# Patient Record
Sex: Female | Born: 1975
Health system: Southern US, Community
[De-identification: ages and names within clinical notes are randomized; demographics above are authoritative.]

## PROBLEM LIST (undated history)

## (undated) DIAGNOSIS — J449 Chronic obstructive pulmonary disease, unspecified: Secondary | ICD-10-CM

## (undated) DIAGNOSIS — K219 Gastro-esophageal reflux disease without esophagitis: Secondary | ICD-10-CM

## (undated) DIAGNOSIS — M129 Arthropathy, unspecified: Secondary | ICD-10-CM

## (undated) DIAGNOSIS — F319 Bipolar disorder, unspecified: Secondary | ICD-10-CM

## (undated) DIAGNOSIS — E785 Hyperlipidemia, unspecified: Secondary | ICD-10-CM

## (undated) DIAGNOSIS — K7689 Other specified diseases of liver: Secondary | ICD-10-CM

## (undated) DIAGNOSIS — I38 Endocarditis, valve unspecified: Secondary | ICD-10-CM

## (undated) DIAGNOSIS — F32A Depression, unspecified: Secondary | ICD-10-CM

## (undated) DIAGNOSIS — G629 Polyneuropathy, unspecified: Secondary | ICD-10-CM

## (undated) DIAGNOSIS — E119 Type 2 diabetes mellitus without complications: Secondary | ICD-10-CM

## (undated) DIAGNOSIS — F1110 Opioid abuse, uncomplicated: Secondary | ICD-10-CM

## (undated) DIAGNOSIS — K759 Inflammatory liver disease, unspecified: Secondary | ICD-10-CM

## (undated) DIAGNOSIS — I10 Essential (primary) hypertension: Secondary | ICD-10-CM

## (undated) DIAGNOSIS — T7840XA Allergy, unspecified, initial encounter: Secondary | ICD-10-CM

## (undated) DIAGNOSIS — F329 Major depressive disorder, single episode, unspecified: Secondary | ICD-10-CM

## (undated) HISTORY — DX: Opioid abuse, uncomplicated: F11.10

## (undated) HISTORY — DX: Polyneuropathy, unspecified: G62.9

## (undated) HISTORY — DX: Inflammatory liver disease, unspecified: K75.9

## (undated) HISTORY — DX: Other specified diseases of liver: K76.89

## (undated) HISTORY — PX: TONSILLECTOMY: SUR1361

## (undated) HISTORY — DX: Bipolar disorder, unspecified: F31.9

## (undated) HISTORY — DX: Gastro-esophageal reflux disease without esophagitis: K21.9

## (undated) HISTORY — DX: Allergy, unspecified, initial encounter: T78.40XA

## (undated) HISTORY — DX: Depression, unspecified: F32.A

## (undated) HISTORY — DX: Type 2 diabetes mellitus without complications: E11.9

## (undated) HISTORY — DX: Hyperlipidemia, unspecified: E78.5

## (undated) HISTORY — PX: OTHER SURGICAL HISTORY: SHX169

## (undated) HISTORY — DX: Chronic obstructive pulmonary disease, unspecified: J44.9

## (undated) HISTORY — DX: Essential (primary) hypertension: I10

## (undated) HISTORY — DX: Arthropathy, unspecified: M12.9

## (undated) HISTORY — DX: Major depressive disorder, single episode, unspecified: F32.9

---

## 2012-06-13 ENCOUNTER — Ambulatory Visit: Payer: Self-pay | Admitting: Gastroenterology

## 2012-12-27 DIAGNOSIS — M5412 Radiculopathy, cervical region: Secondary | ICD-10-CM

## 2012-12-27 DIAGNOSIS — M545 Low back pain, unspecified: Secondary | ICD-10-CM

## 2012-12-27 DIAGNOSIS — M542 Cervicalgia: Secondary | ICD-10-CM

## 2012-12-27 DIAGNOSIS — G894 Chronic pain syndrome: Secondary | ICD-10-CM

## 2012-12-27 HISTORY — DX: Cervicalgia: M54.2

## 2012-12-27 HISTORY — DX: Low back pain, unspecified: M54.50

## 2012-12-27 HISTORY — DX: Chronic pain syndrome: G89.4

## 2012-12-27 HISTORY — DX: Radiculopathy, cervical region: M54.12

## 2013-11-08 DIAGNOSIS — E119 Type 2 diabetes mellitus without complications: Secondary | ICD-10-CM | POA: Diagnosis not present

## 2013-11-08 DIAGNOSIS — L270 Generalized skin eruption due to drugs and medicaments taken internally: Secondary | ICD-10-CM | POA: Diagnosis not present

## 2013-11-08 DIAGNOSIS — F17200 Nicotine dependence, unspecified, uncomplicated: Secondary | ICD-10-CM | POA: Diagnosis not present

## 2013-11-08 DIAGNOSIS — J449 Chronic obstructive pulmonary disease, unspecified: Secondary | ICD-10-CM | POA: Diagnosis not present

## 2013-11-08 DIAGNOSIS — Z888 Allergy status to other drugs, medicaments and biological substances status: Secondary | ICD-10-CM | POA: Diagnosis not present

## 2013-11-08 DIAGNOSIS — I10 Essential (primary) hypertension: Secondary | ICD-10-CM | POA: Diagnosis not present

## 2013-11-10 DIAGNOSIS — A3790 Whooping cough, unspecified species without pneumonia: Secondary | ICD-10-CM | POA: Diagnosis not present

## 2013-11-10 DIAGNOSIS — I10 Essential (primary) hypertension: Secondary | ICD-10-CM | POA: Diagnosis not present

## 2013-11-10 DIAGNOSIS — IMO0001 Reserved for inherently not codable concepts without codable children: Secondary | ICD-10-CM | POA: Diagnosis not present

## 2013-11-26 DIAGNOSIS — F319 Bipolar disorder, unspecified: Secondary | ICD-10-CM | POA: Diagnosis not present

## 2013-11-26 DIAGNOSIS — E7800 Pure hypercholesterolemia, unspecified: Secondary | ICD-10-CM | POA: Diagnosis not present

## 2013-11-26 DIAGNOSIS — E119 Type 2 diabetes mellitus without complications: Secondary | ICD-10-CM | POA: Diagnosis not present

## 2014-02-07 DIAGNOSIS — IMO0002 Reserved for concepts with insufficient information to code with codable children: Secondary | ICD-10-CM | POA: Diagnosis not present

## 2014-02-07 DIAGNOSIS — L0291 Cutaneous abscess, unspecified: Secondary | ICD-10-CM | POA: Diagnosis not present

## 2014-03-03 DIAGNOSIS — N912 Amenorrhea, unspecified: Secondary | ICD-10-CM | POA: Diagnosis not present

## 2014-03-03 DIAGNOSIS — I10 Essential (primary) hypertension: Secondary | ICD-10-CM | POA: Diagnosis not present

## 2014-03-03 DIAGNOSIS — E559 Vitamin D deficiency, unspecified: Secondary | ICD-10-CM | POA: Diagnosis not present

## 2014-03-03 DIAGNOSIS — M2550 Pain in unspecified joint: Secondary | ICD-10-CM | POA: Diagnosis not present

## 2014-03-03 DIAGNOSIS — IMO0002 Reserved for concepts with insufficient information to code with codable children: Secondary | ICD-10-CM | POA: Diagnosis not present

## 2014-03-03 DIAGNOSIS — E782 Mixed hyperlipidemia: Secondary | ICD-10-CM | POA: Diagnosis not present

## 2014-03-03 DIAGNOSIS — E1142 Type 2 diabetes mellitus with diabetic polyneuropathy: Secondary | ICD-10-CM | POA: Diagnosis not present

## 2014-03-03 DIAGNOSIS — E039 Hypothyroidism, unspecified: Secondary | ICD-10-CM | POA: Diagnosis not present

## 2014-03-03 DIAGNOSIS — F17200 Nicotine dependence, unspecified, uncomplicated: Secondary | ICD-10-CM | POA: Diagnosis not present

## 2014-03-03 DIAGNOSIS — E1149 Type 2 diabetes mellitus with other diabetic neurological complication: Secondary | ICD-10-CM | POA: Diagnosis not present

## 2014-03-03 DIAGNOSIS — E119 Type 2 diabetes mellitus without complications: Secondary | ICD-10-CM | POA: Diagnosis not present

## 2014-03-18 DIAGNOSIS — J449 Chronic obstructive pulmonary disease, unspecified: Secondary | ICD-10-CM | POA: Diagnosis not present

## 2014-03-18 DIAGNOSIS — F17200 Nicotine dependence, unspecified, uncomplicated: Secondary | ICD-10-CM | POA: Diagnosis not present

## 2014-03-18 DIAGNOSIS — R059 Cough, unspecified: Secondary | ICD-10-CM | POA: Diagnosis not present

## 2014-03-18 DIAGNOSIS — J310 Chronic rhinitis: Secondary | ICD-10-CM | POA: Diagnosis not present

## 2014-03-18 DIAGNOSIS — R05 Cough: Secondary | ICD-10-CM | POA: Diagnosis not present

## 2014-03-23 DIAGNOSIS — E109 Type 1 diabetes mellitus without complications: Secondary | ICD-10-CM | POA: Diagnosis not present

## 2014-03-30 DIAGNOSIS — K219 Gastro-esophageal reflux disease without esophagitis: Secondary | ICD-10-CM | POA: Diagnosis not present

## 2014-03-30 DIAGNOSIS — K5900 Constipation, unspecified: Secondary | ICD-10-CM | POA: Diagnosis not present

## 2014-03-30 DIAGNOSIS — R894 Abnormal immunological findings in specimens from other organs, systems and tissues: Secondary | ICD-10-CM | POA: Diagnosis not present

## 2014-03-30 DIAGNOSIS — R945 Abnormal results of liver function studies: Secondary | ICD-10-CM | POA: Diagnosis not present

## 2014-03-31 DIAGNOSIS — I10 Essential (primary) hypertension: Secondary | ICD-10-CM | POA: Diagnosis not present

## 2014-03-31 DIAGNOSIS — J449 Chronic obstructive pulmonary disease, unspecified: Secondary | ICD-10-CM | POA: Diagnosis not present

## 2014-03-31 DIAGNOSIS — E1149 Type 2 diabetes mellitus with other diabetic neurological complication: Secondary | ICD-10-CM | POA: Diagnosis not present

## 2014-03-31 DIAGNOSIS — F17200 Nicotine dependence, unspecified, uncomplicated: Secondary | ICD-10-CM | POA: Diagnosis not present

## 2014-03-31 DIAGNOSIS — M5137 Other intervertebral disc degeneration, lumbosacral region: Secondary | ICD-10-CM | POA: Diagnosis not present

## 2014-04-02 ENCOUNTER — Ambulatory Visit (INDEPENDENT_AMBULATORY_CARE_PROVIDER_SITE_OTHER): Payer: Medicare Other

## 2014-04-02 VITALS — BP 106/52 | HR 102 | Temp 98.7°F | Resp 20

## 2014-04-02 DIAGNOSIS — R52 Pain, unspecified: Secondary | ICD-10-CM

## 2014-04-02 DIAGNOSIS — L923 Foreign body granuloma of the skin and subcutaneous tissue: Secondary | ICD-10-CM | POA: Diagnosis not present

## 2014-04-02 DIAGNOSIS — L02612 Cutaneous abscess of left foot: Secondary | ICD-10-CM

## 2014-04-02 DIAGNOSIS — L03119 Cellulitis of unspecified part of limb: Secondary | ICD-10-CM

## 2014-04-02 DIAGNOSIS — L02619 Cutaneous abscess of unspecified foot: Secondary | ICD-10-CM

## 2014-04-02 MED ORDER — CIPROFLOXACIN HCL 500 MG PO TABS: 500.0000 mg | ORAL_TABLET | Freq: Two times a day (BID) | ORAL | Status: DC

## 2014-04-02 MED ORDER — CLINDAMYCIN HCL 150 MG PO CAPS: 150.0000 mg | ORAL_CAPSULE | Freq: Four times a day (QID) | ORAL | Status: DC

## 2014-04-02 NOTE — Progress Notes (Signed)
Subjective:    Patient ID: Emily Larson, female    DOB: [birth year], 38 y.o.   MRN: [id number]  HPI I AM A DIABETIC AND HAVE BEEN FOR ABOUT 5 YEARS AND BURNS AND THROBS ON BOTH MY FEET AND NUMBNESS AND TINGLING AND SOME SWELLING AND HAS SOME COLDNESS TO IT AND HAVE A PLACE ON THE BALL OF THE LEFT FOOT AND HAS BEEN GOING ON FOR ABOUT A MONTH AND THOUGHT IT WAS GLASS AND DR LAND SAID HE GOT IT ALL OUT    Review of Systems  Constitutional: Positive for fever, chills and fatigue.       SWEATING   Respiratory: Positive for shortness of breath and wheezing.   Gastrointestinal: Positive for nausea.  Endocrine:       EXCESSIVE THIRST  Musculoskeletal: Positive for back pain and gait problem.       JOINT PAIN   Neurological: Positive for weakness and numbness.  Hematological:       SLOW TO HEAL  All other systems reviewed and are negative.      Objective:   Physical Exam   38 year old white female well-developed well-nourished oriented x3 presents at this time with significant pain of her left foot patient presents this time with a history of diabetic neuropathy has been pain management currently taking Lyrica for her diabetic neuropathy. Patient however recently had a episode of possible foreign body of glass removed from the bottom of her left foot however continues to have pain tenderness discomfort for the past month. On exam there is an area of lesion plantar sub-fourth metatarsal area left foot possible history of puncture wound versus verruca however there is concern of edema and some fluctuance increased temperature in pain on palpation in the general vicinity possibly consistent with a foreign body and abscess.  Lower extremity objective findings as follows vascular status is intact with pedal pulses palpable DP +2/4 bilateral PT +2/4 bilateral capillary refill time 3 seconds all digits epicritic and proprioceptive sensations intact and symmetric bilateral patient severe hyperesthesia  pain in symptomology with diabetic neuropathy type symptoms. Neurologically skin color pigment normal hair growth absent a small lesion sub-fourth metatarsal which or debridement did not feel any purulent discharge or drainage however did not go down to subcutaneous tissue level Neosporin and Band-Aid is applied to this area at this time x-rays demonstrate a foreign body posse metallic or lead glass plantar to the base of the fourth digit proximal phalanx in the plantar fat pad sub-fourth metatarsal phalangeal joint area left foot. There is no other open wounds no secondary infection localized cellulitis is noted and significant pain however patient has been with pain management I recommended that if she needs anything for pain to make arrangements with her primary Dr. has been managing her pain that would not provide her with narcotic pain medication at this time recommended Advil as needed for pain. Cannot take Tylenol due to hepatitis C history.       Assessment & Plan:  Assessment foreign body abscess sub-fourth metatarsal left foot with localized cellulitis pain and inflammation. Patient will continue with her current pain regimen for primary physician is been managing her pain also scheduled to see pain management doctor in the future. At this time recommendation is for excision foreign body in foreign body abscess plantar left foot a local anesthetic block and regional and IV sedation local anesthetic block a be utilized likely for surgery done at the [facility] specialty surgical center consent form is reviewed and  signed all questions asked medication are answered surgery scheduled her convenience for incision drainage of abscess and excision foreign body Kim's fluoroscopy to identify the foreign body. Surgery was scheduled patient be followed in one week for postop followup try to avoid opiates for management consider a Profore and he'll in the interim suggested Neosporin and Band-Aid be maintained  can do a soaking Epson salts warm water if needed apply ice pack to help reduce the pain in symptomology as well  Prescriptions for clindamycin and ciprofloxacin are dispensed at this time.  Alvan Dameichard Wilbur Oakland DPM

## 2014-04-02 NOTE — Patient Instructions (Signed)
Pre-Operative Instructions  Congratulations, you have decided to take an important step to improving your quality of life.  You can be assured that the doctors of [facility] will be with you every step of the way.  1. Plan to be at the surgery center/hospital at least 1 (one) hour prior to your scheduled time unless otherwise directed by the surgical center/hospital staff.  You must have a responsible adult accompany you, remain during the surgery and drive you home.  Make sure you have directions to the surgical center/hospital and know how to get there on time. 2. For hospital based surgery you will need to obtain a history and physical form from your family physician within 1 month prior to the date of surgery- we will give you a form for you primary physician.  3. We make every effort to accommodate the date you request for surgery.  There are however, times where surgery dates or times have to be moved.  We will contact you as soon as possible if a change in schedule is required.   4. No Aspirin/Ibuprofen for one week before surgery.  If you are on aspirin, any non-steroidal anti-inflammatory medications (Mobic, Aleve, Ibuprofen) you should stop taking it 7 days prior to your surgery.  You make take Tylenol  For pain prior to surgery.  5. Medications- If you are taking daily heart and blood pressure medications, seizure, reflux, allergy, asthma, anxiety, pain or diabetes medications, make sure the surgery center/hospital is aware before the day of surgery so they may notify you which medications to take or avoid the day of surgery. 6. No food or drink after midnight the night before surgery unless directed otherwise by surgical center/hospital staff. 7. No alcoholic beverages 24 hours prior to surgery.  No smoking 24 hours prior to or 24 hours after surgery. 8. Wear loose pants or shorts- loose enough to fit over bandages, boots, and casts. 9. No slip on shoes, sneakers are best. 10. Bring  your boot with you to the surgery center/hospital.  Also bring crutches or a walker if your physician has prescribed it for you.  If you do not have this equipment, it will be provided for you after surgery. 11. If you have not been contracted by the surgery center/hospital by the day before your surgery, call to confirm the date and time of your surgery. 12. Leave-time from work may vary depending on the type of surgery you have.  Appropriate arrangements should be made prior to surgery with your employer. 13. Prescriptions will be provided immediately following surgery by your doctor.  Have these filled as soon as possible after surgery and take the medication as directed. 14. Remove nail polish on the operative foot. 15. Wash the night before surgery.  The night before surgery wash the foot and leg well with the antibacterial soap provided and water paying special attention to beneath the toenails and in between the toes.  Rinse thoroughly with water and dry well with a towel.  Perform this wash unless told not to do so by your physician.  Enclosed: 1 Ice pack (please put in freezer the night before surgery)   1 Hibiclens skin cleaner   Pre-op Instructions  If you have any questions regarding the instructions, do not hesitate to call our office.  [place]: [address] [place], [place] [address] [phone]  [place]: [address] [place], [place] [address] [phone]  [place]: [address]  , [place] [address] [phone]  Dr. Antwann Preziosi   Tuchman DPM, Dr. Norman Regal DPM Dr. Lesly Joslyn DPM, Dr. M. Todd Hyatt DPM, Dr. Kathryn Egerton DPM 

## 2014-04-03 DIAGNOSIS — L03039 Cellulitis of unspecified toe: Secondary | ICD-10-CM

## 2014-04-03 DIAGNOSIS — B9689 Other specified bacterial agents as the cause of diseases classified elsewhere: Secondary | ICD-10-CM | POA: Diagnosis not present

## 2014-04-03 DIAGNOSIS — L723 Sebaceous cyst: Secondary | ICD-10-CM | POA: Diagnosis not present

## 2014-04-03 DIAGNOSIS — A499 Bacterial infection, unspecified: Secondary | ICD-10-CM | POA: Diagnosis not present

## 2014-04-03 DIAGNOSIS — L02619 Cutaneous abscess of unspecified foot: Secondary | ICD-10-CM

## 2014-04-03 DIAGNOSIS — L923 Foreign body granuloma of the skin and subcutaneous tissue: Secondary | ICD-10-CM | POA: Diagnosis not present

## 2014-04-03 DIAGNOSIS — I10 Essential (primary) hypertension: Secondary | ICD-10-CM | POA: Diagnosis not present

## 2014-04-03 DIAGNOSIS — Z1812 Retained nonmagnetic metal fragments: Secondary | ICD-10-CM | POA: Diagnosis not present

## 2014-04-08 ENCOUNTER — Telehealth: Payer: Self-pay | Admitting: *Deleted

## 2014-04-08 MED ORDER — SULFAMETHOXAZOLE-TMP DS 800-160 MG PO TABS: 1.0000 | ORAL_TABLET | Freq: Two times a day (BID) | ORAL | Status: DC

## 2014-04-08 NOTE — Telephone Encounter (Signed)
I attempted to call the patient and tell her to stop the Clindamycin per Dr. Ralene CorkSikora.  Sent in a prescription for Septra DS, start taking it.

## 2014-04-09 ENCOUNTER — Other Ambulatory Visit: Payer: Self-pay | Admitting: *Deleted

## 2014-04-09 ENCOUNTER — Ambulatory Visit (INDEPENDENT_AMBULATORY_CARE_PROVIDER_SITE_OTHER): Payer: Medicare Other

## 2014-04-09 VITALS — BP 100/63 | HR 110 | Resp 18

## 2014-04-09 DIAGNOSIS — Z09 Encounter for follow-up examination after completed treatment for conditions other than malignant neoplasm: Secondary | ICD-10-CM

## 2014-04-09 DIAGNOSIS — L03119 Cellulitis of unspecified part of limb: Secondary | ICD-10-CM

## 2014-04-09 DIAGNOSIS — L02619 Cutaneous abscess of unspecified foot: Secondary | ICD-10-CM

## 2014-04-09 DIAGNOSIS — L923 Foreign body granuloma of the skin and subcutaneous tissue: Secondary | ICD-10-CM

## 2014-04-09 DIAGNOSIS — L02612 Cutaneous abscess of left foot: Secondary | ICD-10-CM

## 2014-04-09 MED ORDER — FLUCONAZOLE 150 MG PO TABS: 150.0000 mg | ORAL_TABLET | ORAL | Status: DC

## 2014-04-09 MED ORDER — SULFAMETHOXAZOLE-TMP DS 800-160 MG PO TABS: 1.0000 | ORAL_TABLET | Freq: Two times a day (BID) | ORAL | Status: DC

## 2014-04-09 NOTE — Progress Notes (Signed)
° °  Subjective:    Patient ID: Emily Larson, female    DOB: 12/14/1975, 38 y.o.   MRN: [id number]  HPI it is still hurting on my left foot and how much longer am i going to have to stay off if it    Review of Systems     Objective:   Physical Exam        Assessment & Plan:

## 2014-04-09 NOTE — Patient Instructions (Signed)
ICE INSTRUCTIONS  Apply ice or cold pack to the affected area at least 3 times a day for 10-15 minutes each time.  You should also use ice after prolonged activity or vigorous exercise.  Do not apply ice longer than 20 minutes at one time.  Always keep a cloth between your skin and the ice pack to prevent burns.  Being consistent and following these instructions will help control your symptoms.  We suggest you purchase a gel ice pack because they are reusable and do bit leak.  Some of them are designed to wrap around the area.  Use the method that works best for you.  Here are some other suggestions for icing.   Use a frozen bag of peas or corn-inexpensive and molds well to your body, usually stays frozen for 10 to 20 minutes.  Wet a towel with cold water and squeeze out the excess until it's damp.  Place in a bag in the freezer for 20 minutes. Then remove and use.  Discontinue clindamycin antibiotic. Immediately start using Septra DS/ sulfamethoxazole antibiotic twice daily as instructed  Take Diflucan 150 mg tablet, 1 every other day

## 2014-04-09 NOTE — Telephone Encounter (Signed)
I called and informed the patient that Dr. Ralene CorkSikora said to stop the Clindamycin he got your culture results back.  He wants you to start taking Septra DS.  She asked what did the culture show.  I told her it showed Staph infection.  She stated will he give me the prescription when I see him today.  I told her I was going to send it now.  I asked her what pharmacy she uses.  She stated [facility] in [place], [place].  I sent the prescription to the pharmacy.

## 2014-04-09 NOTE — Progress Notes (Signed)
   Subjective:    Patient ID: Emily Larson, female    DOB: 11/20/1975, 38 y.o.   MRN: [id number]  HPI patient presents at this time 6 days status post excision foreign body abscess drainage plantar left foot. Culture reports confirm a scab that was resistant to clindamycin therefore we'll switch antibiotics at this time. Tried: Clindamycin however would not go to her pharmacy.    Review of Systems no new findings or systemic changes noted patient however is developing and yeast infection due to for long-term antibiotics the first patient with Diflucan dose to help alleviate the infection     Objective:   Physical Exam Lower extremity objective findings reveal pedal pulses palpable epicritic sensations intact incision clean dry well coapted dressings intact and dry the drain is removed there is also signs of bleeding within the dressing consistent with appropriate drainage no purulences noted no malodor is noted at this time areas prepped with Betadine and a dry sterile gauze dressing was reapplied to the left foot maintain Darco shoe or when shoe at all times moderate walking use ice pack at this time new prescription for Septra DS is issued to the patient we'll discontinue clindamycin start Septra also prescription for Diflucan is given reappointed in one week for followup and dressing change         Assessment & Plan:  Assessment good postop progress patient hasn't fever or slight chills special first few days after surgery otherwise in stable we'll switch of antibiotic to Septra which should have better coverage for the resistant staph will recheck in one week for dressing change maintain dressings intact clean and dry also prescription for Diflucan furnish at this time we'll continue with postop monitoring in one week next  Alvan Dameichard Armentha Branagan DPM

## 2014-04-09 NOTE — Progress Notes (Signed)
Dr Ralene CorkSikora performed a removal of foreign body and abcess from bottom of left foot. Prescribed trmadol 50mg  #50 1-2 tabss q 6hrs prn pain

## 2014-04-15 DIAGNOSIS — R05 Cough: Secondary | ICD-10-CM | POA: Diagnosis not present

## 2014-04-15 DIAGNOSIS — J449 Chronic obstructive pulmonary disease, unspecified: Secondary | ICD-10-CM | POA: Diagnosis not present

## 2014-04-15 DIAGNOSIS — F17200 Nicotine dependence, unspecified, uncomplicated: Secondary | ICD-10-CM | POA: Diagnosis not present

## 2014-04-15 DIAGNOSIS — R059 Cough, unspecified: Secondary | ICD-10-CM | POA: Diagnosis not present

## 2014-04-15 DIAGNOSIS — J310 Chronic rhinitis: Secondary | ICD-10-CM | POA: Diagnosis not present

## 2014-04-16 ENCOUNTER — Ambulatory Visit (INDEPENDENT_AMBULATORY_CARE_PROVIDER_SITE_OTHER): Payer: Medicare Other

## 2014-04-16 VITALS — BP 98/60 | HR 94 | Resp 20

## 2014-04-16 DIAGNOSIS — L02612 Cutaneous abscess of left foot: Secondary | ICD-10-CM

## 2014-04-16 DIAGNOSIS — L03119 Cellulitis of unspecified part of limb: Secondary | ICD-10-CM

## 2014-04-16 DIAGNOSIS — Z09 Encounter for follow-up examination after completed treatment for conditions other than malignant neoplasm: Secondary | ICD-10-CM

## 2014-04-16 DIAGNOSIS — L02619 Cutaneous abscess of unspecified foot: Secondary | ICD-10-CM

## 2014-04-16 DIAGNOSIS — L923 Foreign body granuloma of the skin and subcutaneous tissue: Secondary | ICD-10-CM

## 2014-04-16 MED ORDER — TRAMADOL HCL 50 MG PO TABS: 50.0000 mg | ORAL_TABLET | Freq: Four times a day (QID) | ORAL | Status: DC | PRN

## 2014-04-16 NOTE — Progress Notes (Signed)
   Subjective:    Patient ID: Grayland Jack, female    DOB: [birth year], 38 y.o.   MRN: [id number]  HPI THIS BOOT IS HURTING MY BACK AND I HAVE A ROD ON MY RIGHT SIDE AND IT HURTS THERE AND STILL SORE AND HURTING AND THERE IS SOME DRAINING AND I FEEL DIZZY AND WEAK TODAY    Review of Systems no new findings or systemic changes however there is some concern for patient feeling weaker dizzy     Objective:   Physical Exam Lower extremity objective findings revealed good postop progress there have been some slight drainage in the bandage although no active bleeding or discharge noted current time. Patient does mild ecchymosis and edema consistent with postop course incision appears well coapted no dehiscence no drainage noted currently at this time remainder of exam unremarkable neurovascular status is intact and unchanged pedal pulses are palpable no increased temperature no fever chills or patient is been feeling will dizzy or weak today she's been taking tramadol 2 every 4 hours or 6 hours and suggest down to one every 6 hours if needed for pain in the refill for tramadol as given at this time. Patient will continue with the Bactrim DS twice a day has completed all the Cipro at this point.      Assessment & Plan:  The wound appears to well coapted presto compressive dressing was reapplied maintain a Darco shoe may switch from the Darco wedge to plane Darco shoe the patient has at home minimalize or moderate walking duties only necessary standing and walking patient noted that when she was on a more intake and throbs and swelling. Reappointed one week plan for suture removal followup at that time.  Alvan Dame DPM

## 2014-04-16 NOTE — Patient Instructions (Signed)
ICE INSTRUCTIONS  Apply ice or cold pack to the affected area at least 3 times a day for 10-15 minutes each time.  You should also use ice after prolonged activity or vigorous exercise.  Do not apply ice longer than 20 minutes at one time.  Always keep a cloth between your skin and the ice pack to prevent burns.  Being consistent and following these instructions will help control your symptoms.  We suggest you purchase a gel ice pack because they are reusable and do bit leak.  Some of them are designed to wrap around the area.  Use the method that works best for you.  Here are some other suggestions for icing.   Use a frozen bag of peas or corn-inexpensive and molds well to your body, usually stays frozen for 10 to 20 minutes.  Wet a towel with cold water and squeeze out the excess until it's damp.  Place in a bag in the freezer for 20 minutes. Then remove and use.  Minimize any walking or standing to necessary activities only

## 2014-04-22 DIAGNOSIS — B182 Chronic viral hepatitis C: Secondary | ICD-10-CM | POA: Diagnosis not present

## 2014-04-22 DIAGNOSIS — Z23 Encounter for immunization: Secondary | ICD-10-CM | POA: Diagnosis not present

## 2014-04-22 DIAGNOSIS — K5900 Constipation, unspecified: Secondary | ICD-10-CM | POA: Diagnosis not present

## 2014-04-23 ENCOUNTER — Ambulatory Visit (INDEPENDENT_AMBULATORY_CARE_PROVIDER_SITE_OTHER): Payer: Medicare Other

## 2014-04-23 VITALS — BP 100/52 | HR 94 | Resp 18

## 2014-04-23 DIAGNOSIS — L02612 Cutaneous abscess of left foot: Secondary | ICD-10-CM

## 2014-04-23 DIAGNOSIS — L03119 Cellulitis of unspecified part of limb: Secondary | ICD-10-CM

## 2014-04-23 DIAGNOSIS — L02619 Cutaneous abscess of unspecified foot: Secondary | ICD-10-CM

## 2014-04-23 DIAGNOSIS — Z09 Encounter for follow-up examination after completed treatment for conditions other than malignant neoplasm: Secondary | ICD-10-CM

## 2014-04-23 DIAGNOSIS — L923 Foreign body granuloma of the skin and subcutaneous tissue: Secondary | ICD-10-CM

## 2014-04-23 NOTE — Patient Instructions (Signed)
ANTIBACTERIAL SOAP INSTRUCTIONS  THE DAY AFTER PROCEDURE  Please follow the instructions your doctor has marked.   Shower as usual. Before getting out, place a drop of antibacterial liquid soap (Dial) on a wet, clean washcloth.  Gently wipe washcloth over affected area.  Afterward, rinse the area with warm water.  Blot the area dry with a soft cloth and cover with antibiotic ointment (neosporin, polysporin, bacitracin) and band aid or gauze and tape  Place 3-4 drops of antibacterial liquid soap in a quart of warm tap water.  Submerge foot into water for 20 minutes.  If bandage was applied after your procedure, leave on to allow for easy lift off, then remove and continue with soak for the remaining time.  Next, blot area dry with a soft cloth and cover with a bandage.  Apply other medications as directed by your doctor, such as cortisporin otic solution (eardrops) or neosporin antibiotic ointment  May resume normal bathing and showers wash the foot with soap and water afterwards apply Neosporin or Polysporin or cocoa butter to the scar incision area to help soften the scar tissue. Maintain the use of the compression stocking to keep down and swelling wear the stocking during the day wash every evening the heading dry overnight and reapply during the day.

## 2014-04-23 NOTE — Progress Notes (Signed)
   Subjective:    Patient ID: Emily Larson, female    DOB: 01/03/1976, 38 y.o.   MRN: [id number]  HPI I HAD SURGERY ON 04/03/14 ON LEFT FOOT AND IT STILL SWELLS SOME AND I HOPE THE STITCHES COME OUT TODAY    Review of Systems no new findings or systemic changes    Objective:   Physical Exam Neurovascular status is intact pedal pulses palpable incision clean dry well coapted sutures are removed at this time with minimal discomfort there is slight eschar present in edema consistent with postop course this time systems Neosporin and a gauze and a anklet are applied to maintain compression maintain anklet daily leave off at night may resume normal bathing and hygiene maintain a comfortable thick soled shoe no barefoot or flimsy shoes or flip-flop       Assessment & Plan:  Assessment good postop progress incision appears to be well coapted use cocoa butter cream or Neosporin to the incision area maintain compression stocking recheck in one month for long-term followup  Alvan Dame DPM

## 2014-05-13 DIAGNOSIS — I10 Essential (primary) hypertension: Secondary | ICD-10-CM | POA: Diagnosis not present

## 2014-05-13 DIAGNOSIS — J441 Chronic obstructive pulmonary disease with (acute) exacerbation: Secondary | ICD-10-CM | POA: Diagnosis not present

## 2014-05-13 DIAGNOSIS — J684 Chronic respiratory conditions due to chemicals, gases, fumes and vapors: Secondary | ICD-10-CM | POA: Diagnosis not present

## 2014-05-13 DIAGNOSIS — E1149 Type 2 diabetes mellitus with other diabetic neurological complication: Secondary | ICD-10-CM | POA: Diagnosis not present

## 2014-05-13 DIAGNOSIS — F17200 Nicotine dependence, unspecified, uncomplicated: Secondary | ICD-10-CM | POA: Diagnosis not present

## 2014-05-18 DIAGNOSIS — F17200 Nicotine dependence, unspecified, uncomplicated: Secondary | ICD-10-CM | POA: Diagnosis not present

## 2014-05-18 DIAGNOSIS — J449 Chronic obstructive pulmonary disease, unspecified: Secondary | ICD-10-CM | POA: Diagnosis not present

## 2014-05-18 DIAGNOSIS — I6350 Cerebral infarction due to unspecified occlusion or stenosis of unspecified cerebral artery: Secondary | ICD-10-CM | POA: Diagnosis not present

## 2014-05-18 DIAGNOSIS — H538 Other visual disturbances: Secondary | ICD-10-CM | POA: Diagnosis not present

## 2014-05-18 DIAGNOSIS — H547 Unspecified visual loss: Secondary | ICD-10-CM | POA: Diagnosis not present

## 2014-05-18 DIAGNOSIS — J310 Chronic rhinitis: Secondary | ICD-10-CM | POA: Diagnosis not present

## 2014-05-18 DIAGNOSIS — R51 Headache: Secondary | ICD-10-CM | POA: Diagnosis not present

## 2014-05-18 DIAGNOSIS — E860 Dehydration: Secondary | ICD-10-CM | POA: Diagnosis not present

## 2014-05-19 DIAGNOSIS — H53129 Transient visual loss, unspecified eye: Secondary | ICD-10-CM | POA: Diagnosis present

## 2014-05-19 DIAGNOSIS — Z79899 Other long term (current) drug therapy: Secondary | ICD-10-CM | POA: Diagnosis not present

## 2014-05-19 DIAGNOSIS — K219 Gastro-esophageal reflux disease without esophagitis: Secondary | ICD-10-CM | POA: Diagnosis present

## 2014-05-19 DIAGNOSIS — H547 Unspecified visual loss: Secondary | ICD-10-CM | POA: Diagnosis not present

## 2014-05-19 DIAGNOSIS — E860 Dehydration: Secondary | ICD-10-CM | POA: Diagnosis not present

## 2014-05-19 DIAGNOSIS — F17200 Nicotine dependence, unspecified, uncomplicated: Secondary | ICD-10-CM | POA: Diagnosis present

## 2014-05-19 DIAGNOSIS — E119 Type 2 diabetes mellitus without complications: Secondary | ICD-10-CM | POA: Diagnosis present

## 2014-05-19 DIAGNOSIS — R51 Headache: Secondary | ICD-10-CM | POA: Diagnosis not present

## 2014-05-19 DIAGNOSIS — I10 Essential (primary) hypertension: Secondary | ICD-10-CM | POA: Diagnosis present

## 2014-05-19 DIAGNOSIS — E871 Hypo-osmolality and hyponatremia: Secondary | ICD-10-CM | POA: Diagnosis present

## 2014-05-19 DIAGNOSIS — I6350 Cerebral infarction due to unspecified occlusion or stenosis of unspecified cerebral artery: Secondary | ICD-10-CM | POA: Diagnosis not present

## 2014-05-19 DIAGNOSIS — Z794 Long term (current) use of insulin: Secondary | ICD-10-CM | POA: Diagnosis not present

## 2014-05-19 DIAGNOSIS — N179 Acute kidney failure, unspecified: Secondary | ICD-10-CM | POA: Diagnosis present

## 2014-05-19 DIAGNOSIS — E7800 Pure hypercholesterolemia, unspecified: Secondary | ICD-10-CM | POA: Diagnosis present

## 2014-05-19 DIAGNOSIS — J449 Chronic obstructive pulmonary disease, unspecified: Secondary | ICD-10-CM | POA: Diagnosis present

## 2014-05-21 ENCOUNTER — Ambulatory Visit (INDEPENDENT_AMBULATORY_CARE_PROVIDER_SITE_OTHER): Payer: Medicare Other

## 2014-05-21 VITALS — BP 112/58 | HR 84 | Resp 12

## 2014-05-21 DIAGNOSIS — Z09 Encounter for follow-up examination after completed treatment for conditions other than malignant neoplasm: Secondary | ICD-10-CM

## 2014-05-21 DIAGNOSIS — L02612 Cutaneous abscess of left foot: Secondary | ICD-10-CM

## 2014-05-21 DIAGNOSIS — L923 Foreign body granuloma of the skin and subcutaneous tissue: Secondary | ICD-10-CM

## 2014-05-21 NOTE — Progress Notes (Signed)
   Subjective:    Patient ID: Emily Larson, female    DOB: 02/01/1976, 38 y.o.   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       Assessment & Plan:  Assessment good postop progress following excision foreign body granuloma plantar left foot followup in the future as needed literature about diabetic foot care and recommendations about shoewear are given also information about peripheral neuropathy are given to the patient patient discharged from postop standpoint to an as-needed basis for future followup next  Alvan Dameichard Kadyn Chovan DPM

## 2014-05-21 NOTE — Patient Instructions (Signed)
Peripheral Neuropathy Peripheral neuropathy is a type of nerve damage. It affects nerves that carry signals between the spinal cord and other parts of the body. These are called peripheral nerves. With peripheral neuropathy, one nerve or a group of nerves may be damaged.  CAUSES  Many things can damage peripheral nerves. For some people with peripheral neuropathy, the cause is unknown. Some causes include:  Diabetes. This is the most common cause of peripheral neuropathy.  Injury to a nerve.  Pressure or stress on a nerve that lasts a long time.  Too little vitamin B. Alcoholism can lead to this.  Infections.  Autoimmune diseases, such as multiple sclerosis and systemic lupus erythematosus.  Inherited nerve diseases.  Some medicines, such as cancer drugs.  Toxic substances, such as lead and mercury.  Too little blood flowing to the legs.  Kidney disease.  Thyroid disease. SIGNS AND SYMPTOMS  Different people have different symptoms. The symptoms you have will depend on which of your nerves is damaged. Common symptoms include:  Loss of feeling (numbness) in the feet and hands.  Tingling in the feet and hands.  Pain that burns.  Very sensitive skin.  Weakness.  Not being able to move a part of the body (paralysis).  Muscle twitching.  Clumsiness or poor coordination.  Loss of balance.  Not being able to control your bladder.  Feeling dizzy.  Sexual problems. DIAGNOSIS  Peripheral neuropathy is a symptom, not a disease. Finding the cause of peripheral neuropathy can be hard. To figure that out, your health care provider will take a medical history and do a physical exam. A neurological exam will also be done. This involves checking things affected by your brain, spinal cord, and nerves (nervous system). For example, your health care provider will check your reflexes, how you move, and what you can feel.  Other types of tests may also be ordered, such as:  Blood  tests.  A test of the fluid in your spinal cord.  Imaging tests, such as CT scans or an MRI.  Electromyography (EMG). This test checks the nerves that control muscles.  Nerve conduction velocity tests. These tests check how fast messages pass through your nerves.  Nerve biopsy. A small piece of nerve is removed. It is then checked under a microscope. TREATMENT   Medicine is often used to treat peripheral neuropathy. Medicines may include:  Pain-relieving medicines. Prescription or over-the-counter medicine may be suggested.  Antiseizure medicine. This may be used for pain.  Antidepressants. These also may help ease pain from neuropathy.  Lidocaine. This is a numbing medicine. You might wear a patch or be given a shot.  Mexiletine. This medicine is typically used to help control irregular heart rhythms.  Surgery. Surgery may be needed to relieve pressure on a nerve or to destroy a nerve that is causing pain.  Physical therapy to help movement.  Assistive devices to help movement. HOME CARE INSTRUCTIONS   Only take over-the-counter or prescription medicines as directed by your health care provider. Follow the instructions carefully for any given medicines. Do not take any other medicines without first getting approval from your health care provider.  If you have diabetes, work closely with your health care provider to keep your blood sugar under control.  If you have numbness in your feet:  Check every day for signs of injury or infection. Watch for redness, warmth, and swelling.  Wear padded socks and comfortable shoes. These help protect your feet.  Do not do   things that put pressure on your damaged nerve.  Do not smoke. Smoking keeps blood from getting to damaged nerves.  Avoid or limit alcohol. Too much alcohol can cause a lack of B vitamins. These vitamins are needed for healthy nerves.  Develop a good support system. Coping with peripheral neuropathy can be  stressful. Talk to a mental health specialist or join a support group if you are struggling.  Follow up with your health care provider as directed. SEEK MEDICAL CARE IF:   You have new signs or symptoms of peripheral neuropathy.  You are struggling emotionally from dealing with peripheral neuropathy.  You have a fever. SEEK IMMEDIATE MEDICAL CARE IF:   You have an injury or infection that is not healing.  You feel very dizzy or begin vomiting.  You have chest pain.  You have trouble breathing. Document Released: 07/28/2002 Document Revised: 04/19/2011 Document Reviewed: 04/14/2013 ExitCare Patient Information 2015 [employer]. This information is not intended to replace advice given to you by your health care provider. Make sure you discuss any questions you have with your health care provider.    Diabetes and Foot Care Diabetes may cause you to have problems because of poor blood supply (circulation) to your feet and legs. This may cause the skin on your feet to become thinner, break easier, and heal more slowly. Your skin may become dry, and the skin may peel and crack. You may also have nerve damage in your legs and feet causing decreased feeling in them. You may not notice minor injuries to your feet that could lead to infections or more serious problems. Taking care of your feet is one of the most important things you can do for yourself.  HOME CARE INSTRUCTIONS  Wear shoes at all times, even in the house. Do not go barefoot. Bare feet are easily injured.  Check your feet daily for blisters, cuts, and redness. If you cannot see the bottom of your feet, use a mirror or ask someone for help.  Wash your feet with warm water (do not use hot water) and mild soap. Then pat your feet and the areas between your toes until they are completely dry. Do not soak your feet as this can dry your skin.  Apply a moisturizing lotion or petroleum jelly (that does not contain alcohol and is  unscented) to the skin on your feet and to dry, brittle toenails. Do not apply lotion between your toes.  Trim your toenails straight across. Do not dig under them or around the cuticle. File the edges of your nails with an emery board or nail file.  Do not cut corns or calluses or try to remove them with medicine.  Wear clean socks or stockings every day. Make sure they are not too tight. Do not wear knee-high stockings since they may decrease blood flow to your legs.  Wear shoes that fit properly and have enough cushioning. To break in new shoes, wear them for just a few hours a day. This prevents you from injuring your feet. Always look in your shoes before you put them on to be sure there are no objects inside.  Do not cross your legs. This may decrease the blood flow to your feet.  If you find a minor scrape, cut, or break in the skin on your feet, keep it and the skin around it clean and dry. These areas may be cleansed with mild soap and water. Do not cleanse the area with peroxide, alcohol,   or iodine.  When you remove an adhesive bandage, be sure not to damage the skin around it.  If you have a wound, look at it several times a day to make sure it is healing.  Do not use heating pads or hot water bottles. They may burn your skin. If you have lost feeling in your feet or legs, you may not know it is happening until it is too late.  Make sure your health care provider performs a complete foot exam at least annually or more often if you have foot problems. Report any cuts, sores, or bruises to your health care provider immediately. SEEK MEDICAL CARE IF:   You have an injury that is not healing.  You have cuts or breaks in the skin.  You have an ingrown nail.  You notice redness on your legs or feet.  You feel burning or tingling in your legs or feet.  You have pain or cramps in your legs and feet.  Your legs or feet are numb.  Your feet always feel cold. SEEK IMMEDIATE  MEDICAL CARE IF:   There is increasing redness, swelling, or pain in or around a wound.  There is a red line that goes up your leg.  Pus is coming from a wound.  You develop a fever or as directed by your health care provider.  You notice a bad smell coming from an ulcer or wound. Document Released: 08/04/2000 Document Revised: 04/09/2013 Document Reviewed: 01/14/2013 Exit[employer] Patient Information 2015 [employer]. This information is not intended to replace advice given to you by your health care provider. Make sure you discuss any questions you have with your health care provider.  

## 2014-05-27 DIAGNOSIS — E039 Hypothyroidism, unspecified: Secondary | ICD-10-CM | POA: Diagnosis not present

## 2014-05-27 DIAGNOSIS — I10 Essential (primary) hypertension: Secondary | ICD-10-CM | POA: Diagnosis not present

## 2014-05-27 DIAGNOSIS — E782 Mixed hyperlipidemia: Secondary | ICD-10-CM | POA: Diagnosis not present

## 2014-05-27 DIAGNOSIS — E1165 Type 2 diabetes mellitus with hyperglycemia: Secondary | ICD-10-CM | POA: Diagnosis not present

## 2014-05-27 DIAGNOSIS — M2550 Pain in unspecified joint: Secondary | ICD-10-CM | POA: Diagnosis not present

## 2014-05-27 DIAGNOSIS — R252 Cramp and spasm: Secondary | ICD-10-CM | POA: Diagnosis not present

## 2014-05-27 DIAGNOSIS — E119 Type 2 diabetes mellitus without complications: Secondary | ICD-10-CM | POA: Diagnosis not present

## 2014-05-27 DIAGNOSIS — E559 Vitamin D deficiency, unspecified: Secondary | ICD-10-CM | POA: Diagnosis not present

## 2014-05-27 DIAGNOSIS — J418 Mixed simple and mucopurulent chronic bronchitis: Secondary | ICD-10-CM | POA: Diagnosis not present

## 2014-05-28 DIAGNOSIS — J449 Chronic obstructive pulmonary disease, unspecified: Secondary | ICD-10-CM | POA: Diagnosis not present

## 2014-05-28 DIAGNOSIS — I638 Other cerebral infarction: Secondary | ICD-10-CM | POA: Diagnosis not present

## 2014-05-28 DIAGNOSIS — M1990 Unspecified osteoarthritis, unspecified site: Secondary | ICD-10-CM | POA: Diagnosis not present

## 2014-05-28 DIAGNOSIS — E119 Type 2 diabetes mellitus without complications: Secondary | ICD-10-CM | POA: Diagnosis not present

## 2014-05-28 DIAGNOSIS — D72829 Elevated white blood cell count, unspecified: Secondary | ICD-10-CM | POA: Diagnosis not present

## 2014-05-28 DIAGNOSIS — F17210 Nicotine dependence, cigarettes, uncomplicated: Secondary | ICD-10-CM | POA: Diagnosis not present

## 2014-05-28 DIAGNOSIS — A419 Sepsis, unspecified organism: Secondary | ICD-10-CM | POA: Diagnosis not present

## 2014-05-28 DIAGNOSIS — R509 Fever, unspecified: Secondary | ICD-10-CM | POA: Diagnosis not present

## 2014-05-28 DIAGNOSIS — R0689 Other abnormalities of breathing: Secondary | ICD-10-CM | POA: Diagnosis not present

## 2014-05-28 DIAGNOSIS — R05 Cough: Secondary | ICD-10-CM | POA: Diagnosis not present

## 2014-05-28 DIAGNOSIS — I10 Essential (primary) hypertension: Secondary | ICD-10-CM | POA: Diagnosis not present

## 2014-05-28 DIAGNOSIS — I959 Hypotension, unspecified: Secondary | ICD-10-CM | POA: Diagnosis not present

## 2014-05-28 DIAGNOSIS — I639 Cerebral infarction, unspecified: Secondary | ICD-10-CM | POA: Diagnosis not present

## 2014-05-29 DIAGNOSIS — R0902 Hypoxemia: Secondary | ICD-10-CM | POA: Diagnosis not present

## 2014-05-29 DIAGNOSIS — Z8673 Personal history of transient ischemic attack (TIA), and cerebral infarction without residual deficits: Secondary | ICD-10-CM

## 2014-05-29 DIAGNOSIS — R6520 Severe sepsis without septic shock: Secondary | ICD-10-CM | POA: Diagnosis not present

## 2014-05-29 DIAGNOSIS — I10 Essential (primary) hypertension: Secondary | ICD-10-CM

## 2014-05-29 DIAGNOSIS — K7689 Other specified diseases of liver: Secondary | ICD-10-CM | POA: Diagnosis not present

## 2014-05-29 DIAGNOSIS — R918 Other nonspecific abnormal finding of lung field: Secondary | ICD-10-CM | POA: Diagnosis not present

## 2014-05-29 DIAGNOSIS — I959 Hypotension, unspecified: Secondary | ICD-10-CM | POA: Diagnosis not present

## 2014-05-29 DIAGNOSIS — J449 Chronic obstructive pulmonary disease, unspecified: Secondary | ICD-10-CM

## 2014-05-29 DIAGNOSIS — I350 Nonrheumatic aortic (valve) stenosis: Secondary | ICD-10-CM | POA: Diagnosis not present

## 2014-05-29 DIAGNOSIS — J189 Pneumonia, unspecified organism: Secondary | ICD-10-CM | POA: Diagnosis not present

## 2014-05-29 DIAGNOSIS — A419 Sepsis, unspecified organism: Secondary | ICD-10-CM | POA: Diagnosis not present

## 2014-05-29 DIAGNOSIS — R509 Fever, unspecified: Secondary | ICD-10-CM | POA: Diagnosis not present

## 2014-05-29 DIAGNOSIS — A4181 Sepsis due to Enterococcus: Secondary | ICD-10-CM | POA: Diagnosis not present

## 2014-05-29 DIAGNOSIS — I517 Cardiomegaly: Secondary | ICD-10-CM | POA: Diagnosis not present

## 2014-05-29 HISTORY — DX: Chronic obstructive pulmonary disease, unspecified: J44.9

## 2014-05-29 HISTORY — DX: Essential (primary) hypertension: I10

## 2014-05-29 HISTORY — DX: Personal history of transient ischemic attack (TIA), and cerebral infarction without residual deficits: Z86.73

## 2014-05-30 DIAGNOSIS — Z452 Encounter for adjustment and management of vascular access device: Secondary | ICD-10-CM | POA: Diagnosis not present

## 2014-05-30 DIAGNOSIS — J849 Interstitial pulmonary disease, unspecified: Secondary | ICD-10-CM | POA: Diagnosis not present

## 2014-05-30 DIAGNOSIS — J189 Pneumonia, unspecified organism: Secondary | ICD-10-CM | POA: Diagnosis not present

## 2014-05-30 DIAGNOSIS — A4181 Sepsis due to Enterococcus: Secondary | ICD-10-CM | POA: Diagnosis not present

## 2014-05-30 DIAGNOSIS — I959 Hypotension, unspecified: Secondary | ICD-10-CM | POA: Diagnosis not present

## 2014-05-30 DIAGNOSIS — R0902 Hypoxemia: Secondary | ICD-10-CM | POA: Diagnosis not present

## 2014-05-30 DIAGNOSIS — R6520 Severe sepsis without septic shock: Secondary | ICD-10-CM | POA: Diagnosis not present

## 2014-05-31 DIAGNOSIS — A4181 Sepsis due to Enterococcus: Secondary | ICD-10-CM | POA: Diagnosis not present

## 2014-05-31 DIAGNOSIS — R6520 Severe sepsis without septic shock: Secondary | ICD-10-CM | POA: Diagnosis not present

## 2014-05-31 DIAGNOSIS — J189 Pneumonia, unspecified organism: Secondary | ICD-10-CM | POA: Diagnosis not present

## 2014-05-31 DIAGNOSIS — I959 Hypotension, unspecified: Secondary | ICD-10-CM | POA: Diagnosis not present

## 2014-06-01 DIAGNOSIS — J984 Other disorders of lung: Secondary | ICD-10-CM | POA: Diagnosis not present

## 2014-06-01 DIAGNOSIS — R918 Other nonspecific abnormal finding of lung field: Secondary | ICD-10-CM | POA: Diagnosis not present

## 2014-06-01 DIAGNOSIS — J449 Chronic obstructive pulmonary disease, unspecified: Secondary | ICD-10-CM | POA: Diagnosis not present

## 2014-06-01 DIAGNOSIS — J9621 Acute and chronic respiratory failure with hypoxia: Secondary | ICD-10-CM | POA: Diagnosis not present

## 2014-06-01 DIAGNOSIS — I340 Nonrheumatic mitral (valve) insufficiency: Secondary | ICD-10-CM | POA: Diagnosis not present

## 2014-06-01 DIAGNOSIS — J189 Pneumonia, unspecified organism: Secondary | ICD-10-CM | POA: Diagnosis not present

## 2014-06-01 DIAGNOSIS — I517 Cardiomegaly: Secondary | ICD-10-CM | POA: Diagnosis not present

## 2014-06-01 DIAGNOSIS — I330 Acute and subacute infective endocarditis: Secondary | ICD-10-CM | POA: Diagnosis not present

## 2014-06-01 DIAGNOSIS — R0902 Hypoxemia: Secondary | ICD-10-CM | POA: Diagnosis not present

## 2014-06-01 DIAGNOSIS — R6521 Severe sepsis with septic shock: Secondary | ICD-10-CM | POA: Diagnosis not present

## 2014-06-01 DIAGNOSIS — A4181 Sepsis due to Enterococcus: Secondary | ICD-10-CM | POA: Diagnosis not present

## 2014-06-02 DIAGNOSIS — J189 Pneumonia, unspecified organism: Secondary | ICD-10-CM | POA: Diagnosis not present

## 2014-06-02 DIAGNOSIS — Z0389 Encounter for observation for other suspected diseases and conditions ruled out: Secondary | ICD-10-CM | POA: Diagnosis not present

## 2014-06-02 DIAGNOSIS — R6520 Severe sepsis without septic shock: Secondary | ICD-10-CM | POA: Diagnosis not present

## 2014-06-02 DIAGNOSIS — T50995A Adverse effect of other drugs, medicaments and biological substances, initial encounter: Secondary | ICD-10-CM | POA: Diagnosis not present

## 2014-06-02 DIAGNOSIS — J9621 Acute and chronic respiratory failure with hypoxia: Secondary | ICD-10-CM | POA: Diagnosis not present

## 2014-06-02 DIAGNOSIS — M79672 Pain in left foot: Secondary | ICD-10-CM | POA: Diagnosis not present

## 2014-06-02 DIAGNOSIS — M7989 Other specified soft tissue disorders: Secondary | ICD-10-CM | POA: Diagnosis not present

## 2014-06-02 DIAGNOSIS — A419 Sepsis, unspecified organism: Secondary | ICD-10-CM | POA: Diagnosis not present

## 2014-06-02 DIAGNOSIS — A4181 Sepsis due to Enterococcus: Secondary | ICD-10-CM | POA: Diagnosis not present

## 2014-06-02 DIAGNOSIS — M869 Osteomyelitis, unspecified: Secondary | ICD-10-CM | POA: Diagnosis not present

## 2014-06-02 DIAGNOSIS — J449 Chronic obstructive pulmonary disease, unspecified: Secondary | ICD-10-CM | POA: Diagnosis not present

## 2014-06-02 DIAGNOSIS — M85872 Other specified disorders of bone density and structure, left ankle and foot: Secondary | ICD-10-CM | POA: Diagnosis not present

## 2014-06-02 DIAGNOSIS — R7881 Bacteremia: Secondary | ICD-10-CM | POA: Diagnosis not present

## 2014-06-02 DIAGNOSIS — I38 Endocarditis, valve unspecified: Secondary | ICD-10-CM | POA: Diagnosis not present

## 2014-06-03 DIAGNOSIS — R7881 Bacteremia: Secondary | ICD-10-CM | POA: Diagnosis not present

## 2014-06-03 DIAGNOSIS — M7989 Other specified soft tissue disorders: Secondary | ICD-10-CM | POA: Diagnosis not present

## 2014-06-03 DIAGNOSIS — J449 Chronic obstructive pulmonary disease, unspecified: Secondary | ICD-10-CM | POA: Diagnosis not present

## 2014-06-03 DIAGNOSIS — J189 Pneumonia, unspecified organism: Secondary | ICD-10-CM | POA: Diagnosis not present

## 2014-06-03 DIAGNOSIS — Z8673 Personal history of transient ischemic attack (TIA), and cerebral infarction without residual deficits: Secondary | ICD-10-CM | POA: Diagnosis not present

## 2014-06-03 DIAGNOSIS — R609 Edema, unspecified: Secondary | ICD-10-CM | POA: Diagnosis not present

## 2014-06-04 DIAGNOSIS — I330 Acute and subacute infective endocarditis: Secondary | ICD-10-CM | POA: Diagnosis not present

## 2014-06-04 DIAGNOSIS — M86172 Other acute osteomyelitis, left ankle and foot: Secondary | ICD-10-CM | POA: Diagnosis not present

## 2014-06-04 DIAGNOSIS — R7881 Bacteremia: Secondary | ICD-10-CM | POA: Diagnosis not present

## 2014-06-04 DIAGNOSIS — I058 Other rheumatic mitral valve diseases: Secondary | ICD-10-CM | POA: Diagnosis not present

## 2014-06-04 DIAGNOSIS — T50995D Adverse effect of other drugs, medicaments and biological substances, subsequent encounter: Secondary | ICD-10-CM | POA: Diagnosis not present

## 2014-06-04 DIAGNOSIS — M869 Osteomyelitis, unspecified: Secondary | ICD-10-CM | POA: Diagnosis not present

## 2014-06-04 DIAGNOSIS — R6520 Severe sepsis without septic shock: Secondary | ICD-10-CM | POA: Diagnosis not present

## 2014-06-04 DIAGNOSIS — I38 Endocarditis, valve unspecified: Secondary | ICD-10-CM | POA: Diagnosis not present

## 2014-06-04 DIAGNOSIS — A4181 Sepsis due to Enterococcus: Secondary | ICD-10-CM | POA: Diagnosis not present

## 2014-06-05 DIAGNOSIS — I330 Acute and subacute infective endocarditis: Secondary | ICD-10-CM | POA: Diagnosis not present

## 2014-06-05 DIAGNOSIS — I058 Other rheumatic mitral valve diseases: Secondary | ICD-10-CM | POA: Diagnosis not present

## 2014-06-05 DIAGNOSIS — R6520 Severe sepsis without septic shock: Secondary | ICD-10-CM | POA: Diagnosis not present

## 2014-06-05 DIAGNOSIS — M86172 Other acute osteomyelitis, left ankle and foot: Secondary | ICD-10-CM | POA: Diagnosis not present

## 2014-06-05 DIAGNOSIS — A4181 Sepsis due to Enterococcus: Secondary | ICD-10-CM | POA: Diagnosis not present

## 2014-06-06 DIAGNOSIS — I330 Acute and subacute infective endocarditis: Secondary | ICD-10-CM | POA: Diagnosis not present

## 2014-06-06 DIAGNOSIS — A4181 Sepsis due to Enterococcus: Secondary | ICD-10-CM | POA: Diagnosis not present

## 2014-06-06 DIAGNOSIS — R509 Fever, unspecified: Secondary | ICD-10-CM | POA: Diagnosis not present

## 2014-06-06 DIAGNOSIS — J449 Chronic obstructive pulmonary disease, unspecified: Secondary | ICD-10-CM | POA: Diagnosis not present

## 2014-06-06 DIAGNOSIS — R6520 Severe sepsis without septic shock: Secondary | ICD-10-CM | POA: Diagnosis not present

## 2014-06-07 DIAGNOSIS — I330 Acute and subacute infective endocarditis: Secondary | ICD-10-CM | POA: Diagnosis not present

## 2014-06-07 DIAGNOSIS — R509 Fever, unspecified: Secondary | ICD-10-CM | POA: Diagnosis not present

## 2014-06-07 DIAGNOSIS — Z452 Encounter for adjustment and management of vascular access device: Secondary | ICD-10-CM | POA: Diagnosis not present

## 2014-06-07 DIAGNOSIS — A4181 Sepsis due to Enterococcus: Secondary | ICD-10-CM | POA: Diagnosis not present

## 2014-06-07 DIAGNOSIS — J449 Chronic obstructive pulmonary disease, unspecified: Secondary | ICD-10-CM | POA: Diagnosis not present

## 2014-06-07 DIAGNOSIS — R6520 Severe sepsis without septic shock: Secondary | ICD-10-CM | POA: Diagnosis not present

## 2014-06-08 DIAGNOSIS — A4159 Other Gram-negative sepsis: Secondary | ICD-10-CM | POA: Diagnosis not present

## 2014-06-08 DIAGNOSIS — R600 Localized edema: Secondary | ICD-10-CM | POA: Diagnosis not present

## 2014-06-08 DIAGNOSIS — M549 Dorsalgia, unspecified: Secondary | ICD-10-CM | POA: Diagnosis not present

## 2014-06-08 DIAGNOSIS — I351 Nonrheumatic aortic (valve) insufficiency: Secondary | ICD-10-CM | POA: Diagnosis not present

## 2014-06-08 DIAGNOSIS — I517 Cardiomegaly: Secondary | ICD-10-CM | POA: Diagnosis not present

## 2014-06-08 DIAGNOSIS — I5189 Other ill-defined heart diseases: Secondary | ICD-10-CM | POA: Diagnosis not present

## 2014-06-08 DIAGNOSIS — J449 Chronic obstructive pulmonary disease, unspecified: Secondary | ICD-10-CM | POA: Diagnosis not present

## 2014-06-08 DIAGNOSIS — I052 Rheumatic mitral stenosis with insufficiency: Secondary | ICD-10-CM | POA: Diagnosis not present

## 2014-06-08 DIAGNOSIS — M869 Osteomyelitis, unspecified: Secondary | ICD-10-CM

## 2014-06-08 DIAGNOSIS — B9689 Other specified bacterial agents as the cause of diseases classified elsewhere: Secondary | ICD-10-CM | POA: Diagnosis not present

## 2014-06-08 DIAGNOSIS — A4181 Sepsis due to Enterococcus: Secondary | ICD-10-CM | POA: Diagnosis not present

## 2014-06-08 DIAGNOSIS — I330 Acute and subacute infective endocarditis: Secondary | ICD-10-CM | POA: Diagnosis not present

## 2014-06-08 DIAGNOSIS — R6520 Severe sepsis without septic shock: Secondary | ICD-10-CM | POA: Diagnosis not present

## 2014-06-08 DIAGNOSIS — I059 Rheumatic mitral valve disease, unspecified: Secondary | ICD-10-CM

## 2014-06-08 DIAGNOSIS — R509 Fever, unspecified: Secondary | ICD-10-CM | POA: Diagnosis not present

## 2014-06-08 HISTORY — DX: Osteomyelitis, unspecified: M86.9

## 2014-06-08 HISTORY — DX: Rheumatic mitral valve disease, unspecified: I05.9

## 2014-06-09 DIAGNOSIS — I330 Acute and subacute infective endocarditis: Secondary | ICD-10-CM | POA: Diagnosis not present

## 2014-06-09 DIAGNOSIS — B9689 Other specified bacterial agents as the cause of diseases classified elsewhere: Secondary | ICD-10-CM | POA: Diagnosis not present

## 2014-06-09 DIAGNOSIS — A4181 Sepsis due to Enterococcus: Secondary | ICD-10-CM | POA: Diagnosis not present

## 2014-06-09 DIAGNOSIS — J449 Chronic obstructive pulmonary disease, unspecified: Secondary | ICD-10-CM | POA: Diagnosis not present

## 2014-06-09 DIAGNOSIS — R509 Fever, unspecified: Secondary | ICD-10-CM | POA: Diagnosis not present

## 2014-06-09 DIAGNOSIS — R6520 Severe sepsis without septic shock: Secondary | ICD-10-CM | POA: Diagnosis not present

## 2014-06-09 DIAGNOSIS — R0682 Tachypnea, not elsewhere classified: Secondary | ICD-10-CM | POA: Diagnosis not present

## 2014-06-09 DIAGNOSIS — A4159 Other Gram-negative sepsis: Secondary | ICD-10-CM | POA: Diagnosis not present

## 2014-06-10 DIAGNOSIS — J449 Chronic obstructive pulmonary disease, unspecified: Secondary | ICD-10-CM | POA: Diagnosis not present

## 2014-06-10 DIAGNOSIS — R509 Fever, unspecified: Secondary | ICD-10-CM | POA: Diagnosis not present

## 2014-06-10 DIAGNOSIS — R21 Rash and other nonspecific skin eruption: Secondary | ICD-10-CM | POA: Diagnosis not present

## 2014-06-10 DIAGNOSIS — R6520 Severe sepsis without septic shock: Secondary | ICD-10-CM | POA: Diagnosis not present

## 2014-06-10 DIAGNOSIS — A4181 Sepsis due to Enterococcus: Secondary | ICD-10-CM | POA: Diagnosis not present

## 2014-06-10 DIAGNOSIS — A4159 Other Gram-negative sepsis: Secondary | ICD-10-CM | POA: Diagnosis not present

## 2014-06-10 DIAGNOSIS — B9689 Other specified bacterial agents as the cause of diseases classified elsewhere: Secondary | ICD-10-CM | POA: Diagnosis not present

## 2014-06-10 DIAGNOSIS — I330 Acute and subacute infective endocarditis: Secondary | ICD-10-CM | POA: Diagnosis not present

## 2014-06-11 DIAGNOSIS — R0682 Tachypnea, not elsewhere classified: Secondary | ICD-10-CM | POA: Diagnosis not present

## 2014-06-11 DIAGNOSIS — M86272 Subacute osteomyelitis, left ankle and foot: Secondary | ICD-10-CM | POA: Diagnosis not present

## 2014-06-11 DIAGNOSIS — A4181 Sepsis due to Enterococcus: Secondary | ICD-10-CM | POA: Diagnosis not present

## 2014-06-11 DIAGNOSIS — R6520 Severe sepsis without septic shock: Secondary | ICD-10-CM | POA: Diagnosis not present

## 2014-06-11 DIAGNOSIS — I330 Acute and subacute infective endocarditis: Secondary | ICD-10-CM | POA: Diagnosis not present

## 2014-06-11 DIAGNOSIS — R509 Fever, unspecified: Secondary | ICD-10-CM | POA: Diagnosis not present

## 2014-06-12 DIAGNOSIS — A419 Sepsis, unspecified organism: Secondary | ICD-10-CM | POA: Diagnosis not present

## 2014-06-12 DIAGNOSIS — M86072 Acute hematogenous osteomyelitis, left ankle and foot: Secondary | ICD-10-CM | POA: Diagnosis not present

## 2014-06-12 DIAGNOSIS — M86672 Other chronic osteomyelitis, left ankle and foot: Secondary | ICD-10-CM | POA: Diagnosis present

## 2014-06-12 DIAGNOSIS — I330 Acute and subacute infective endocarditis: Secondary | ICD-10-CM | POA: Diagnosis not present

## 2014-06-12 DIAGNOSIS — F3340 Major depressive disorder, recurrent, in remission, unspecified: Secondary | ICD-10-CM

## 2014-06-12 DIAGNOSIS — R410 Disorientation, unspecified: Secondary | ICD-10-CM | POA: Diagnosis not present

## 2014-06-12 DIAGNOSIS — M86272 Subacute osteomyelitis, left ankle and foot: Secondary | ICD-10-CM | POA: Diagnosis not present

## 2014-06-12 DIAGNOSIS — R51 Headache: Secondary | ICD-10-CM | POA: Diagnosis not present

## 2014-06-12 DIAGNOSIS — J441 Chronic obstructive pulmonary disease with (acute) exacerbation: Secondary | ICD-10-CM | POA: Diagnosis not present

## 2014-06-12 DIAGNOSIS — J449 Chronic obstructive pulmonary disease, unspecified: Secondary | ICD-10-CM | POA: Diagnosis not present

## 2014-06-12 DIAGNOSIS — N179 Acute kidney failure, unspecified: Secondary | ICD-10-CM | POA: Diagnosis present

## 2014-06-12 DIAGNOSIS — R6520 Severe sepsis without septic shock: Secondary | ICD-10-CM | POA: Diagnosis not present

## 2014-06-12 DIAGNOSIS — I058 Other rheumatic mitral valve diseases: Secondary | ICD-10-CM | POA: Diagnosis not present

## 2014-06-12 DIAGNOSIS — R509 Fever, unspecified: Secondary | ICD-10-CM | POA: Diagnosis not present

## 2014-06-12 DIAGNOSIS — Z7982 Long term (current) use of aspirin: Secondary | ICD-10-CM | POA: Diagnosis not present

## 2014-06-12 DIAGNOSIS — M1990 Unspecified osteoarthritis, unspecified site: Secondary | ICD-10-CM | POA: Diagnosis present

## 2014-06-12 DIAGNOSIS — I638 Other cerebral infarction: Secondary | ICD-10-CM | POA: Diagnosis not present

## 2014-06-12 DIAGNOSIS — F339 Major depressive disorder, recurrent, unspecified: Secondary | ICD-10-CM | POA: Diagnosis present

## 2014-06-12 DIAGNOSIS — Z8673 Personal history of transient ischemic attack (TIA), and cerebral infarction without residual deficits: Secondary | ICD-10-CM | POA: Diagnosis not present

## 2014-06-12 DIAGNOSIS — A4181 Sepsis due to Enterococcus: Secondary | ICD-10-CM | POA: Diagnosis not present

## 2014-06-12 DIAGNOSIS — Z880 Allergy status to penicillin: Secondary | ICD-10-CM | POA: Diagnosis not present

## 2014-06-12 DIAGNOSIS — B9620 Unspecified Escherichia coli [E. coli] as the cause of diseases classified elsewhere: Secondary | ICD-10-CM | POA: Diagnosis present

## 2014-06-12 DIAGNOSIS — B9689 Other specified bacterial agents as the cause of diseases classified elsewhere: Secondary | ICD-10-CM | POA: Diagnosis present

## 2014-06-12 DIAGNOSIS — F17210 Nicotine dependence, cigarettes, uncomplicated: Secondary | ICD-10-CM | POA: Diagnosis present

## 2014-06-12 DIAGNOSIS — A4159 Other Gram-negative sepsis: Secondary | ICD-10-CM | POA: Diagnosis present

## 2014-06-12 DIAGNOSIS — I6350 Cerebral infarction due to unspecified occlusion or stenosis of unspecified cerebral artery: Secondary | ICD-10-CM | POA: Diagnosis not present

## 2014-06-12 DIAGNOSIS — R918 Other nonspecific abnormal finding of lung field: Secondary | ICD-10-CM | POA: Diagnosis not present

## 2014-06-12 DIAGNOSIS — B1920 Unspecified viral hepatitis C without hepatic coma: Secondary | ICD-10-CM | POA: Diagnosis present

## 2014-06-12 DIAGNOSIS — R7881 Bacteremia: Secondary | ICD-10-CM | POA: Diagnosis not present

## 2014-06-12 DIAGNOSIS — E1149 Type 2 diabetes mellitus with other diabetic neurological complication: Secondary | ICD-10-CM | POA: Diagnosis present

## 2014-06-12 DIAGNOSIS — N390 Urinary tract infection, site not specified: Secondary | ICD-10-CM | POA: Diagnosis not present

## 2014-06-12 DIAGNOSIS — B952 Enterococcus as the cause of diseases classified elsewhere: Secondary | ICD-10-CM | POA: Diagnosis not present

## 2014-06-12 DIAGNOSIS — Z794 Long term (current) use of insulin: Secondary | ICD-10-CM | POA: Diagnosis not present

## 2014-06-12 DIAGNOSIS — R740 Nonspecific elevation of levels of transaminase and lactic acid dehydrogenase [LDH]: Secondary | ICD-10-CM | POA: Diagnosis present

## 2014-06-12 DIAGNOSIS — F319 Bipolar disorder, unspecified: Secondary | ICD-10-CM | POA: Diagnosis present

## 2014-06-12 DIAGNOSIS — I69398 Other sequelae of cerebral infarction: Secondary | ICD-10-CM | POA: Diagnosis not present

## 2014-06-12 DIAGNOSIS — Z7951 Long term (current) use of inhaled steroids: Secondary | ICD-10-CM | POA: Diagnosis not present

## 2014-06-12 DIAGNOSIS — H547 Unspecified visual loss: Secondary | ICD-10-CM | POA: Diagnosis not present

## 2014-06-12 DIAGNOSIS — I10 Essential (primary) hypertension: Secondary | ICD-10-CM | POA: Diagnosis present

## 2014-06-12 DIAGNOSIS — Z716 Tobacco abuse counseling: Secondary | ICD-10-CM | POA: Diagnosis present

## 2014-06-12 DIAGNOSIS — H538 Other visual disturbances: Secondary | ICD-10-CM | POA: Diagnosis not present

## 2014-06-12 DIAGNOSIS — R279 Unspecified lack of coordination: Secondary | ICD-10-CM | POA: Diagnosis not present

## 2014-06-12 HISTORY — DX: Major depressive disorder, recurrent, in remission, unspecified: F33.40

## 2014-06-16 DIAGNOSIS — M86072 Acute hematogenous osteomyelitis, left ankle and foot: Secondary | ICD-10-CM | POA: Diagnosis not present

## 2014-06-16 DIAGNOSIS — J441 Chronic obstructive pulmonary disease with (acute) exacerbation: Secondary | ICD-10-CM | POA: Diagnosis not present

## 2014-06-16 DIAGNOSIS — I058 Other rheumatic mitral valve diseases: Secondary | ICD-10-CM | POA: Diagnosis not present

## 2014-06-16 DIAGNOSIS — I330 Acute and subacute infective endocarditis: Secondary | ICD-10-CM | POA: Diagnosis not present

## 2014-06-23 DIAGNOSIS — I058 Other rheumatic mitral valve diseases: Secondary | ICD-10-CM | POA: Diagnosis not present

## 2014-06-23 DIAGNOSIS — R7881 Bacteremia: Secondary | ICD-10-CM | POA: Diagnosis not present

## 2014-06-23 DIAGNOSIS — I330 Acute and subacute infective endocarditis: Secondary | ICD-10-CM | POA: Diagnosis not present

## 2014-06-23 DIAGNOSIS — B952 Enterococcus as the cause of diseases classified elsewhere: Secondary | ICD-10-CM | POA: Diagnosis not present

## 2014-06-26 DIAGNOSIS — R51 Headache: Secondary | ICD-10-CM | POA: Diagnosis not present

## 2014-06-26 DIAGNOSIS — I058 Other rheumatic mitral valve diseases: Secondary | ICD-10-CM | POA: Diagnosis not present

## 2014-06-26 DIAGNOSIS — N390 Urinary tract infection, site not specified: Secondary | ICD-10-CM | POA: Diagnosis not present

## 2014-06-27 DIAGNOSIS — I058 Other rheumatic mitral valve diseases: Secondary | ICD-10-CM | POA: Diagnosis not present

## 2014-06-27 DIAGNOSIS — F3340 Major depressive disorder, recurrent, in remission, unspecified: Secondary | ICD-10-CM | POA: Diagnosis not present

## 2014-06-27 DIAGNOSIS — J449 Chronic obstructive pulmonary disease, unspecified: Secondary | ICD-10-CM | POA: Diagnosis not present

## 2014-06-27 DIAGNOSIS — Z8673 Personal history of transient ischemic attack (TIA), and cerebral infarction without residual deficits: Secondary | ICD-10-CM | POA: Diagnosis not present

## 2014-06-28 DIAGNOSIS — B1920 Unspecified viral hepatitis C without hepatic coma: Secondary | ICD-10-CM | POA: Diagnosis present

## 2014-06-28 DIAGNOSIS — B9620 Unspecified Escherichia coli [E. coli] as the cause of diseases classified elsewhere: Secondary | ICD-10-CM | POA: Diagnosis present

## 2014-06-28 DIAGNOSIS — R410 Disorientation, unspecified: Secondary | ICD-10-CM

## 2014-06-28 DIAGNOSIS — Z7982 Long term (current) use of aspirin: Secondary | ICD-10-CM | POA: Diagnosis not present

## 2014-06-28 DIAGNOSIS — R740 Nonspecific elevation of levels of transaminase and lactic acid dehydrogenase [LDH]: Secondary | ICD-10-CM | POA: Diagnosis present

## 2014-06-28 DIAGNOSIS — E1149 Type 2 diabetes mellitus with other diabetic neurological complication: Secondary | ICD-10-CM | POA: Diagnosis present

## 2014-06-28 DIAGNOSIS — F319 Bipolar disorder, unspecified: Secondary | ICD-10-CM | POA: Diagnosis present

## 2014-06-28 DIAGNOSIS — F3340 Major depressive disorder, recurrent, in remission, unspecified: Secondary | ICD-10-CM | POA: Diagnosis not present

## 2014-06-28 DIAGNOSIS — N390 Urinary tract infection, site not specified: Secondary | ICD-10-CM | POA: Diagnosis present

## 2014-06-28 DIAGNOSIS — Z716 Tobacco abuse counseling: Secondary | ICD-10-CM | POA: Diagnosis present

## 2014-06-28 DIAGNOSIS — F17210 Nicotine dependence, cigarettes, uncomplicated: Secondary | ICD-10-CM | POA: Diagnosis present

## 2014-06-28 DIAGNOSIS — N179 Acute kidney failure, unspecified: Secondary | ICD-10-CM | POA: Diagnosis present

## 2014-06-28 DIAGNOSIS — M1990 Unspecified osteoarthritis, unspecified site: Secondary | ICD-10-CM | POA: Diagnosis present

## 2014-06-28 DIAGNOSIS — Z794 Long term (current) use of insulin: Secondary | ICD-10-CM | POA: Diagnosis not present

## 2014-06-28 DIAGNOSIS — I638 Other cerebral infarction: Secondary | ICD-10-CM | POA: Diagnosis not present

## 2014-06-28 DIAGNOSIS — I10 Essential (primary) hypertension: Secondary | ICD-10-CM | POA: Diagnosis present

## 2014-06-28 DIAGNOSIS — H538 Other visual disturbances: Secondary | ICD-10-CM | POA: Diagnosis not present

## 2014-06-28 DIAGNOSIS — I330 Acute and subacute infective endocarditis: Secondary | ICD-10-CM | POA: Diagnosis present

## 2014-06-28 DIAGNOSIS — I058 Other rheumatic mitral valve diseases: Secondary | ICD-10-CM | POA: Diagnosis not present

## 2014-06-28 DIAGNOSIS — R51 Headache: Secondary | ICD-10-CM | POA: Diagnosis not present

## 2014-06-28 DIAGNOSIS — I6350 Cerebral infarction due to unspecified occlusion or stenosis of unspecified cerebral artery: Secondary | ICD-10-CM | POA: Diagnosis not present

## 2014-06-28 DIAGNOSIS — M86672 Other chronic osteomyelitis, left ankle and foot: Secondary | ICD-10-CM | POA: Diagnosis present

## 2014-06-28 DIAGNOSIS — Z880 Allergy status to penicillin: Secondary | ICD-10-CM | POA: Diagnosis not present

## 2014-06-28 DIAGNOSIS — H547 Unspecified visual loss: Secondary | ICD-10-CM

## 2014-06-28 DIAGNOSIS — J449 Chronic obstructive pulmonary disease, unspecified: Secondary | ICD-10-CM | POA: Diagnosis not present

## 2014-06-28 DIAGNOSIS — Z8673 Personal history of transient ischemic attack (TIA), and cerebral infarction without residual deficits: Secondary | ICD-10-CM | POA: Diagnosis not present

## 2014-06-28 HISTORY — DX: Disorientation, unspecified: R41.0

## 2014-06-28 HISTORY — DX: Unspecified visual loss: H54.7

## 2014-06-29 DIAGNOSIS — B1920 Unspecified viral hepatitis C without hepatic coma: Secondary | ICD-10-CM | POA: Diagnosis not present

## 2014-06-29 DIAGNOSIS — I330 Acute and subacute infective endocarditis: Secondary | ICD-10-CM | POA: Diagnosis not present

## 2014-06-29 DIAGNOSIS — I519 Heart disease, unspecified: Secondary | ICD-10-CM | POA: Diagnosis not present

## 2014-06-29 DIAGNOSIS — G9340 Encephalopathy, unspecified: Secondary | ICD-10-CM | POA: Diagnosis not present

## 2014-06-29 DIAGNOSIS — I639 Cerebral infarction, unspecified: Secondary | ICD-10-CM | POA: Diagnosis not present

## 2014-06-30 DIAGNOSIS — I10 Essential (primary) hypertension: Secondary | ICD-10-CM | POA: Diagnosis not present

## 2014-06-30 DIAGNOSIS — I339 Acute and subacute endocarditis, unspecified: Secondary | ICD-10-CM | POA: Diagnosis not present

## 2014-06-30 DIAGNOSIS — I998 Other disorder of circulatory system: Secondary | ICD-10-CM | POA: Diagnosis not present

## 2014-06-30 DIAGNOSIS — Z720 Tobacco use: Secondary | ICD-10-CM

## 2014-06-30 DIAGNOSIS — G9340 Encephalopathy, unspecified: Secondary | ICD-10-CM | POA: Diagnosis not present

## 2014-06-30 DIAGNOSIS — R7881 Bacteremia: Secondary | ICD-10-CM | POA: Diagnosis not present

## 2014-06-30 DIAGNOSIS — R0602 Shortness of breath: Secondary | ICD-10-CM | POA: Diagnosis not present

## 2014-06-30 DIAGNOSIS — I340 Nonrheumatic mitral (valve) insufficiency: Secondary | ICD-10-CM | POA: Diagnosis not present

## 2014-06-30 DIAGNOSIS — I330 Acute and subacute infective endocarditis: Secondary | ICD-10-CM | POA: Diagnosis not present

## 2014-06-30 DIAGNOSIS — I639 Cerebral infarction, unspecified: Secondary | ICD-10-CM | POA: Diagnosis not present

## 2014-06-30 DIAGNOSIS — F17210 Nicotine dependence, cigarettes, uncomplicated: Secondary | ICD-10-CM | POA: Diagnosis not present

## 2014-06-30 DIAGNOSIS — I351 Nonrheumatic aortic (valve) insufficiency: Secondary | ICD-10-CM | POA: Diagnosis not present

## 2014-06-30 DIAGNOSIS — R502 Drug induced fever: Secondary | ICD-10-CM | POA: Diagnosis not present

## 2014-06-30 DIAGNOSIS — I059 Rheumatic mitral valve disease, unspecified: Secondary | ICD-10-CM | POA: Diagnosis not present

## 2014-06-30 DIAGNOSIS — M7989 Other specified soft tissue disorders: Secondary | ICD-10-CM | POA: Diagnosis not present

## 2014-06-30 DIAGNOSIS — I517 Cardiomegaly: Secondary | ICD-10-CM | POA: Diagnosis not present

## 2014-06-30 DIAGNOSIS — M85872 Other specified disorders of bone density and structure, left ankle and foot: Secondary | ICD-10-CM | POA: Diagnosis not present

## 2014-06-30 DIAGNOSIS — R918 Other nonspecific abnormal finding of lung field: Secondary | ICD-10-CM | POA: Diagnosis not present

## 2014-06-30 DIAGNOSIS — J984 Other disorders of lung: Secondary | ICD-10-CM | POA: Diagnosis not present

## 2014-06-30 DIAGNOSIS — B1920 Unspecified viral hepatitis C without hepatic coma: Secondary | ICD-10-CM | POA: Diagnosis not present

## 2014-06-30 HISTORY — DX: Tobacco use: Z72.0

## 2014-07-01 DIAGNOSIS — I330 Acute and subacute infective endocarditis: Secondary | ICD-10-CM | POA: Diagnosis not present

## 2014-07-01 DIAGNOSIS — G9340 Encephalopathy, unspecified: Secondary | ICD-10-CM | POA: Diagnosis not present

## 2014-07-01 DIAGNOSIS — B1920 Unspecified viral hepatitis C without hepatic coma: Secondary | ICD-10-CM | POA: Diagnosis not present

## 2014-07-01 DIAGNOSIS — I639 Cerebral infarction, unspecified: Secondary | ICD-10-CM | POA: Diagnosis not present

## 2014-07-02 DIAGNOSIS — G9340 Encephalopathy, unspecified: Secondary | ICD-10-CM | POA: Diagnosis not present

## 2014-07-02 DIAGNOSIS — I639 Cerebral infarction, unspecified: Secondary | ICD-10-CM | POA: Diagnosis not present

## 2014-07-02 DIAGNOSIS — I330 Acute and subacute infective endocarditis: Secondary | ICD-10-CM | POA: Diagnosis not present

## 2014-07-02 DIAGNOSIS — E119 Type 2 diabetes mellitus without complications: Secondary | ICD-10-CM | POA: Diagnosis not present

## 2014-07-03 DIAGNOSIS — I639 Cerebral infarction, unspecified: Secondary | ICD-10-CM | POA: Diagnosis not present

## 2014-07-03 DIAGNOSIS — E119 Type 2 diabetes mellitus without complications: Secondary | ICD-10-CM | POA: Diagnosis not present

## 2014-07-03 DIAGNOSIS — I330 Acute and subacute infective endocarditis: Secondary | ICD-10-CM | POA: Diagnosis not present

## 2014-07-03 DIAGNOSIS — G9340 Encephalopathy, unspecified: Secondary | ICD-10-CM | POA: Diagnosis not present

## 2014-07-04 DIAGNOSIS — I639 Cerebral infarction, unspecified: Secondary | ICD-10-CM | POA: Diagnosis not present

## 2014-07-04 DIAGNOSIS — I330 Acute and subacute infective endocarditis: Secondary | ICD-10-CM | POA: Diagnosis not present

## 2014-07-04 DIAGNOSIS — G9340 Encephalopathy, unspecified: Secondary | ICD-10-CM | POA: Diagnosis not present

## 2014-07-04 DIAGNOSIS — E119 Type 2 diabetes mellitus without complications: Secondary | ICD-10-CM | POA: Diagnosis not present

## 2014-07-05 DIAGNOSIS — I330 Acute and subacute infective endocarditis: Secondary | ICD-10-CM | POA: Diagnosis not present

## 2014-07-05 DIAGNOSIS — I639 Cerebral infarction, unspecified: Secondary | ICD-10-CM | POA: Diagnosis not present

## 2014-07-05 DIAGNOSIS — G9340 Encephalopathy, unspecified: Secondary | ICD-10-CM | POA: Diagnosis not present

## 2014-07-05 DIAGNOSIS — I959 Hypotension, unspecified: Secondary | ICD-10-CM | POA: Diagnosis not present

## 2014-07-06 DIAGNOSIS — R300 Dysuria: Secondary | ICD-10-CM | POA: Diagnosis present

## 2014-07-06 DIAGNOSIS — F3340 Major depressive disorder, recurrent, in remission, unspecified: Secondary | ICD-10-CM | POA: Diagnosis present

## 2014-07-06 DIAGNOSIS — R930 Abnormal findings on diagnostic imaging of skull and head, not elsewhere classified: Secondary | ICD-10-CM | POA: Diagnosis not present

## 2014-07-06 DIAGNOSIS — I63439 Cerebral infarction due to embolism of unspecified posterior cerebral artery: Secondary | ICD-10-CM | POA: Diagnosis not present

## 2014-07-06 DIAGNOSIS — I10 Essential (primary) hypertension: Secondary | ICD-10-CM | POA: Diagnosis present

## 2014-07-06 DIAGNOSIS — Z881 Allergy status to other antibiotic agents status: Secondary | ICD-10-CM | POA: Diagnosis not present

## 2014-07-06 DIAGNOSIS — Z2252 Carrier of viral hepatitis C: Secondary | ICD-10-CM | POA: Diagnosis not present

## 2014-07-06 DIAGNOSIS — M6250 Muscle wasting and atrophy, not elsewhere classified, unspecified site: Secondary | ICD-10-CM | POA: Diagnosis not present

## 2014-07-06 DIAGNOSIS — F39 Unspecified mood [affective] disorder: Secondary | ICD-10-CM | POA: Diagnosis present

## 2014-07-06 DIAGNOSIS — M868X7 Other osteomyelitis, ankle and foot: Secondary | ICD-10-CM | POA: Diagnosis present

## 2014-07-06 DIAGNOSIS — M1990 Unspecified osteoarthritis, unspecified site: Secondary | ICD-10-CM | POA: Diagnosis present

## 2014-07-06 DIAGNOSIS — I058 Other rheumatic mitral valve diseases: Secondary | ICD-10-CM | POA: Diagnosis not present

## 2014-07-06 DIAGNOSIS — G8929 Other chronic pain: Secondary | ICD-10-CM | POA: Diagnosis present

## 2014-07-06 DIAGNOSIS — E119 Type 2 diabetes mellitus without complications: Secondary | ICD-10-CM | POA: Diagnosis not present

## 2014-07-06 DIAGNOSIS — R002 Palpitations: Secondary | ICD-10-CM | POA: Diagnosis not present

## 2014-07-06 DIAGNOSIS — Z7982 Long term (current) use of aspirin: Secondary | ICD-10-CM | POA: Diagnosis not present

## 2014-07-06 DIAGNOSIS — H547 Unspecified visual loss: Secondary | ICD-10-CM | POA: Diagnosis present

## 2014-07-06 DIAGNOSIS — I330 Acute and subacute infective endocarditis: Secondary | ICD-10-CM | POA: Diagnosis not present

## 2014-07-06 DIAGNOSIS — I639 Cerebral infarction, unspecified: Secondary | ICD-10-CM | POA: Diagnosis not present

## 2014-07-06 DIAGNOSIS — B182 Chronic viral hepatitis C: Secondary | ICD-10-CM | POA: Diagnosis not present

## 2014-07-06 DIAGNOSIS — E1149 Type 2 diabetes mellitus with other diabetic neurological complication: Secondary | ICD-10-CM | POA: Diagnosis present

## 2014-07-06 DIAGNOSIS — Z87891 Personal history of nicotine dependence: Secondary | ICD-10-CM | POA: Diagnosis not present

## 2014-07-06 DIAGNOSIS — K219 Gastro-esophageal reflux disease without esophagitis: Secondary | ICD-10-CM | POA: Diagnosis present

## 2014-07-06 DIAGNOSIS — Z79899 Other long term (current) drug therapy: Secondary | ICD-10-CM | POA: Diagnosis not present

## 2014-07-06 DIAGNOSIS — Z880 Allergy status to penicillin: Secondary | ICD-10-CM | POA: Diagnosis not present

## 2014-07-06 DIAGNOSIS — I6350 Cerebral infarction due to unspecified occlusion or stenosis of unspecified cerebral artery: Secondary | ICD-10-CM | POA: Diagnosis not present

## 2014-07-06 DIAGNOSIS — J449 Chronic obstructive pulmonary disease, unspecified: Secondary | ICD-10-CM | POA: Diagnosis present

## 2014-07-14 DIAGNOSIS — I330 Acute and subacute infective endocarditis: Secondary | ICD-10-CM | POA: Diagnosis not present

## 2014-07-14 DIAGNOSIS — I639 Cerebral infarction, unspecified: Secondary | ICD-10-CM | POA: Diagnosis not present

## 2014-07-14 DIAGNOSIS — E119 Type 2 diabetes mellitus without complications: Secondary | ICD-10-CM | POA: Diagnosis not present

## 2014-07-14 DIAGNOSIS — I058 Other rheumatic mitral valve diseases: Secondary | ICD-10-CM | POA: Diagnosis not present

## 2014-07-15 DIAGNOSIS — Z2252 Carrier of viral hepatitis C: Secondary | ICD-10-CM | POA: Diagnosis not present

## 2014-07-15 DIAGNOSIS — I058 Other rheumatic mitral valve diseases: Secondary | ICD-10-CM | POA: Diagnosis not present

## 2014-07-15 DIAGNOSIS — R002 Palpitations: Secondary | ICD-10-CM | POA: Diagnosis not present

## 2014-07-15 DIAGNOSIS — B182 Chronic viral hepatitis C: Secondary | ICD-10-CM | POA: Diagnosis not present

## 2014-07-15 DIAGNOSIS — I330 Acute and subacute infective endocarditis: Secondary | ICD-10-CM | POA: Diagnosis not present

## 2014-07-15 DIAGNOSIS — E119 Type 2 diabetes mellitus without complications: Secondary | ICD-10-CM | POA: Diagnosis not present

## 2014-07-15 DIAGNOSIS — I639 Cerebral infarction, unspecified: Secondary | ICD-10-CM | POA: Diagnosis not present

## 2014-07-27 DIAGNOSIS — M5386 Other specified dorsopathies, lumbar region: Secondary | ICD-10-CM | POA: Diagnosis not present

## 2014-07-27 DIAGNOSIS — Z79899 Other long term (current) drug therapy: Secondary | ICD-10-CM | POA: Diagnosis not present

## 2014-07-27 DIAGNOSIS — G894 Chronic pain syndrome: Secondary | ICD-10-CM | POA: Diagnosis not present

## 2014-07-27 DIAGNOSIS — M5137 Other intervertebral disc degeneration, lumbosacral region: Secondary | ICD-10-CM | POA: Diagnosis not present

## 2014-07-27 DIAGNOSIS — M791 Myalgia: Secondary | ICD-10-CM | POA: Diagnosis not present

## 2014-07-27 DIAGNOSIS — Z79891 Long term (current) use of opiate analgesic: Secondary | ICD-10-CM | POA: Diagnosis not present

## 2014-07-31 DIAGNOSIS — J42 Unspecified chronic bronchitis: Secondary | ICD-10-CM | POA: Diagnosis not present

## 2014-07-31 DIAGNOSIS — M1990 Unspecified osteoarthritis, unspecified site: Secondary | ICD-10-CM | POA: Diagnosis not present

## 2014-07-31 DIAGNOSIS — F319 Bipolar disorder, unspecified: Secondary | ICD-10-CM | POA: Diagnosis not present

## 2014-07-31 DIAGNOSIS — B1920 Unspecified viral hepatitis C without hepatic coma: Secondary | ICD-10-CM | POA: Diagnosis not present

## 2014-07-31 DIAGNOSIS — N390 Urinary tract infection, site not specified: Secondary | ICD-10-CM | POA: Diagnosis not present

## 2014-07-31 DIAGNOSIS — Z794 Long term (current) use of insulin: Secondary | ICD-10-CM | POA: Diagnosis not present

## 2014-07-31 DIAGNOSIS — Z823 Family history of stroke: Secondary | ICD-10-CM | POA: Diagnosis not present

## 2014-07-31 DIAGNOSIS — F419 Anxiety disorder, unspecified: Secondary | ICD-10-CM | POA: Diagnosis not present

## 2014-07-31 DIAGNOSIS — J449 Chronic obstructive pulmonary disease, unspecified: Secondary | ICD-10-CM | POA: Diagnosis not present

## 2014-07-31 DIAGNOSIS — Z87891 Personal history of nicotine dependence: Secondary | ICD-10-CM | POA: Diagnosis not present

## 2014-07-31 DIAGNOSIS — E119 Type 2 diabetes mellitus without complications: Secondary | ICD-10-CM | POA: Diagnosis not present

## 2014-07-31 DIAGNOSIS — Z833 Family history of diabetes mellitus: Secondary | ICD-10-CM | POA: Diagnosis not present

## 2014-07-31 DIAGNOSIS — Z8249 Family history of ischemic heart disease and other diseases of the circulatory system: Secondary | ICD-10-CM | POA: Diagnosis not present

## 2014-07-31 DIAGNOSIS — I10 Essential (primary) hypertension: Secondary | ICD-10-CM | POA: Diagnosis not present

## 2014-07-31 DIAGNOSIS — Z8673 Personal history of transient ischemic attack (TIA), and cerebral infarction without residual deficits: Secondary | ICD-10-CM | POA: Diagnosis not present

## 2014-07-31 DIAGNOSIS — R011 Cardiac murmur, unspecified: Secondary | ICD-10-CM | POA: Diagnosis not present

## 2014-07-31 DIAGNOSIS — R918 Other nonspecific abnormal finding of lung field: Secondary | ICD-10-CM | POA: Diagnosis not present

## 2014-08-11 DIAGNOSIS — M5137 Other intervertebral disc degeneration, lumbosacral region: Secondary | ICD-10-CM | POA: Diagnosis not present

## 2014-08-11 DIAGNOSIS — R300 Dysuria: Secondary | ICD-10-CM | POA: Diagnosis not present

## 2014-08-11 DIAGNOSIS — J449 Chronic obstructive pulmonary disease, unspecified: Secondary | ICD-10-CM | POA: Diagnosis not present

## 2014-08-11 DIAGNOSIS — M545 Low back pain: Secondary | ICD-10-CM | POA: Diagnosis not present

## 2014-08-11 DIAGNOSIS — F17210 Nicotine dependence, cigarettes, uncomplicated: Secondary | ICD-10-CM | POA: Diagnosis not present

## 2014-08-11 DIAGNOSIS — E1165 Type 2 diabetes mellitus with hyperglycemia: Secondary | ICD-10-CM | POA: Diagnosis not present

## 2014-08-11 DIAGNOSIS — I10 Essential (primary) hypertension: Secondary | ICD-10-CM | POA: Diagnosis not present

## 2014-08-24 DIAGNOSIS — M792 Neuralgia and neuritis, unspecified: Secondary | ICD-10-CM | POA: Diagnosis not present

## 2014-08-24 DIAGNOSIS — M4697 Unspecified inflammatory spondylopathy, lumbosacral region: Secondary | ICD-10-CM | POA: Diagnosis not present

## 2014-08-24 DIAGNOSIS — M545 Low back pain: Secondary | ICD-10-CM | POA: Diagnosis not present

## 2014-08-24 DIAGNOSIS — G894 Chronic pain syndrome: Secondary | ICD-10-CM | POA: Diagnosis not present

## 2014-08-24 DIAGNOSIS — M797 Fibromyalgia: Secondary | ICD-10-CM | POA: Diagnosis not present

## 2014-08-24 DIAGNOSIS — M488X6 Other specified spondylopathies, lumbar region: Secondary | ICD-10-CM | POA: Diagnosis not present

## 2014-08-24 DIAGNOSIS — Z79899 Other long term (current) drug therapy: Secondary | ICD-10-CM | POA: Diagnosis not present

## 2014-08-25 DIAGNOSIS — I10 Essential (primary) hypertension: Secondary | ICD-10-CM | POA: Diagnosis not present

## 2014-08-25 DIAGNOSIS — F17210 Nicotine dependence, cigarettes, uncomplicated: Secondary | ICD-10-CM | POA: Diagnosis not present

## 2014-08-25 DIAGNOSIS — E1165 Type 2 diabetes mellitus with hyperglycemia: Secondary | ICD-10-CM | POA: Diagnosis not present

## 2014-08-25 DIAGNOSIS — F410 Panic disorder [episodic paroxysmal anxiety] without agoraphobia: Secondary | ICD-10-CM | POA: Diagnosis not present

## 2014-09-02 DIAGNOSIS — I10 Essential (primary) hypertension: Secondary | ICD-10-CM | POA: Diagnosis not present

## 2014-09-02 DIAGNOSIS — Z79899 Other long term (current) drug therapy: Secondary | ICD-10-CM | POA: Diagnosis not present

## 2014-09-02 DIAGNOSIS — I351 Nonrheumatic aortic (valve) insufficiency: Secondary | ICD-10-CM | POA: Diagnosis not present

## 2014-09-02 DIAGNOSIS — I058 Other rheumatic mitral valve diseases: Secondary | ICD-10-CM | POA: Diagnosis not present

## 2014-09-02 DIAGNOSIS — Z720 Tobacco use: Secondary | ICD-10-CM | POA: Diagnosis not present

## 2014-09-02 DIAGNOSIS — I059 Rheumatic mitral valve disease, unspecified: Secondary | ICD-10-CM | POA: Diagnosis not present

## 2014-09-02 DIAGNOSIS — I052 Rheumatic mitral stenosis with insufficiency: Secondary | ICD-10-CM | POA: Diagnosis not present

## 2014-09-02 DIAGNOSIS — I050 Rheumatic mitral stenosis: Secondary | ICD-10-CM | POA: Diagnosis not present

## 2014-09-02 DIAGNOSIS — E119 Type 2 diabetes mellitus without complications: Secondary | ICD-10-CM | POA: Diagnosis not present

## 2014-09-02 DIAGNOSIS — I340 Nonrheumatic mitral (valve) insufficiency: Secondary | ICD-10-CM | POA: Diagnosis not present

## 2014-09-02 DIAGNOSIS — J449 Chronic obstructive pulmonary disease, unspecified: Secondary | ICD-10-CM | POA: Diagnosis not present

## 2014-09-02 DIAGNOSIS — Z8673 Personal history of transient ischemic attack (TIA), and cerebral infarction without residual deficits: Secondary | ICD-10-CM | POA: Diagnosis not present

## 2014-09-21 DIAGNOSIS — M792 Neuralgia and neuritis, unspecified: Secondary | ICD-10-CM | POA: Diagnosis not present

## 2014-09-21 DIAGNOSIS — G894 Chronic pain syndrome: Secondary | ICD-10-CM | POA: Diagnosis not present

## 2014-09-21 DIAGNOSIS — M545 Low back pain: Secondary | ICD-10-CM | POA: Diagnosis not present

## 2014-09-21 DIAGNOSIS — Z79899 Other long term (current) drug therapy: Secondary | ICD-10-CM | POA: Diagnosis not present

## 2014-09-21 DIAGNOSIS — M4697 Unspecified inflammatory spondylopathy, lumbosacral region: Secondary | ICD-10-CM | POA: Diagnosis not present

## 2014-09-21 DIAGNOSIS — M488X6 Other specified spondylopathies, lumbar region: Secondary | ICD-10-CM | POA: Diagnosis not present

## 2014-09-21 DIAGNOSIS — M797 Fibromyalgia: Secondary | ICD-10-CM | POA: Diagnosis not present

## 2014-10-06 DIAGNOSIS — Z8679 Personal history of other diseases of the circulatory system: Secondary | ICD-10-CM | POA: Diagnosis not present

## 2014-10-06 DIAGNOSIS — I340 Nonrheumatic mitral (valve) insufficiency: Secondary | ICD-10-CM | POA: Diagnosis not present

## 2014-10-19 DIAGNOSIS — Z79899 Other long term (current) drug therapy: Secondary | ICD-10-CM | POA: Diagnosis not present

## 2014-10-19 DIAGNOSIS — G894 Chronic pain syndrome: Secondary | ICD-10-CM | POA: Diagnosis not present

## 2014-11-16 DIAGNOSIS — Z79899 Other long term (current) drug therapy: Secondary | ICD-10-CM | POA: Diagnosis not present

## 2014-11-16 DIAGNOSIS — M797 Fibromyalgia: Secondary | ICD-10-CM | POA: Diagnosis not present

## 2014-11-16 DIAGNOSIS — G894 Chronic pain syndrome: Secondary | ICD-10-CM | POA: Diagnosis not present

## 2014-11-16 DIAGNOSIS — M4697 Unspecified inflammatory spondylopathy, lumbosacral region: Secondary | ICD-10-CM | POA: Diagnosis not present

## 2014-11-16 DIAGNOSIS — M792 Neuralgia and neuritis, unspecified: Secondary | ICD-10-CM | POA: Diagnosis not present

## 2014-12-14 DIAGNOSIS — G894 Chronic pain syndrome: Secondary | ICD-10-CM | POA: Diagnosis not present

## 2014-12-14 DIAGNOSIS — Z79899 Other long term (current) drug therapy: Secondary | ICD-10-CM | POA: Diagnosis not present

## 2014-12-15 DIAGNOSIS — G894 Chronic pain syndrome: Secondary | ICD-10-CM | POA: Diagnosis not present

## 2014-12-15 DIAGNOSIS — Z79899 Other long term (current) drug therapy: Secondary | ICD-10-CM | POA: Diagnosis not present

## 2015-02-08 DIAGNOSIS — J209 Acute bronchitis, unspecified: Secondary | ICD-10-CM | POA: Diagnosis not present

## 2015-02-08 DIAGNOSIS — F319 Bipolar disorder, unspecified: Secondary | ICD-10-CM | POA: Diagnosis not present

## 2015-02-08 DIAGNOSIS — E1142 Type 2 diabetes mellitus with diabetic polyneuropathy: Secondary | ICD-10-CM | POA: Diagnosis not present

## 2015-02-08 DIAGNOSIS — E1165 Type 2 diabetes mellitus with hyperglycemia: Secondary | ICD-10-CM | POA: Diagnosis not present

## 2015-03-01 DIAGNOSIS — J441 Chronic obstructive pulmonary disease with (acute) exacerbation: Secondary | ICD-10-CM | POA: Diagnosis not present

## 2015-03-01 DIAGNOSIS — Z720 Tobacco use: Secondary | ICD-10-CM | POA: Diagnosis not present

## 2015-03-01 DIAGNOSIS — J310 Chronic rhinitis: Secondary | ICD-10-CM | POA: Diagnosis not present

## 2015-03-08 DIAGNOSIS — J449 Chronic obstructive pulmonary disease, unspecified: Secondary | ICD-10-CM | POA: Diagnosis not present

## 2015-03-08 DIAGNOSIS — E782 Mixed hyperlipidemia: Secondary | ICD-10-CM | POA: Diagnosis not present

## 2015-03-08 DIAGNOSIS — E1142 Type 2 diabetes mellitus with diabetic polyneuropathy: Secondary | ICD-10-CM | POA: Diagnosis not present

## 2015-03-08 DIAGNOSIS — Z9981 Dependence on supplemental oxygen: Secondary | ICD-10-CM | POA: Diagnosis not present

## 2015-03-08 DIAGNOSIS — Z0183 Encounter for blood typing: Secondary | ICD-10-CM | POA: Diagnosis not present

## 2015-03-08 DIAGNOSIS — E1165 Type 2 diabetes mellitus with hyperglycemia: Secondary | ICD-10-CM | POA: Diagnosis not present

## 2015-03-08 DIAGNOSIS — I10 Essential (primary) hypertension: Secondary | ICD-10-CM | POA: Diagnosis not present

## 2015-04-06 DIAGNOSIS — B86 Scabies: Secondary | ICD-10-CM | POA: Diagnosis not present

## 2015-04-06 DIAGNOSIS — I10 Essential (primary) hypertension: Secondary | ICD-10-CM | POA: Diagnosis not present

## 2015-04-06 DIAGNOSIS — J449 Chronic obstructive pulmonary disease, unspecified: Secondary | ICD-10-CM | POA: Diagnosis not present

## 2015-04-06 DIAGNOSIS — K7689 Other specified diseases of liver: Secondary | ICD-10-CM | POA: Diagnosis not present

## 2015-04-06 DIAGNOSIS — E1142 Type 2 diabetes mellitus with diabetic polyneuropathy: Secondary | ICD-10-CM | POA: Diagnosis not present

## 2015-04-06 DIAGNOSIS — K219 Gastro-esophageal reflux disease without esophagitis: Secondary | ICD-10-CM | POA: Diagnosis not present

## 2015-04-06 DIAGNOSIS — E1165 Type 2 diabetes mellitus with hyperglycemia: Secondary | ICD-10-CM | POA: Diagnosis not present

## 2015-04-06 DIAGNOSIS — L301 Dyshidrosis [pompholyx]: Secondary | ICD-10-CM | POA: Diagnosis not present

## 2015-04-15 DIAGNOSIS — I058 Other rheumatic mitral valve diseases: Secondary | ICD-10-CM | POA: Diagnosis not present

## 2015-04-27 DIAGNOSIS — E1142 Type 2 diabetes mellitus with diabetic polyneuropathy: Secondary | ICD-10-CM | POA: Diagnosis not present

## 2015-04-27 DIAGNOSIS — G2581 Restless legs syndrome: Secondary | ICD-10-CM | POA: Diagnosis not present

## 2015-04-27 DIAGNOSIS — E1165 Type 2 diabetes mellitus with hyperglycemia: Secondary | ICD-10-CM | POA: Diagnosis not present

## 2015-04-27 DIAGNOSIS — M545 Low back pain: Secondary | ICD-10-CM | POA: Diagnosis not present

## 2015-04-27 DIAGNOSIS — M5137 Other intervertebral disc degeneration, lumbosacral region: Secondary | ICD-10-CM | POA: Diagnosis not present

## 2015-04-27 DIAGNOSIS — I10 Essential (primary) hypertension: Secondary | ICD-10-CM | POA: Diagnosis not present

## 2015-05-31 DIAGNOSIS — T7849XA Other allergy, initial encounter: Secondary | ICD-10-CM | POA: Diagnosis not present

## 2015-05-31 DIAGNOSIS — E1142 Type 2 diabetes mellitus with diabetic polyneuropathy: Secondary | ICD-10-CM | POA: Diagnosis not present

## 2015-05-31 DIAGNOSIS — R300 Dysuria: Secondary | ICD-10-CM | POA: Diagnosis not present

## 2015-05-31 DIAGNOSIS — R8279 Other abnormal findings on microbiological examination of urine: Secondary | ICD-10-CM | POA: Diagnosis not present

## 2015-05-31 DIAGNOSIS — B86 Scabies: Secondary | ICD-10-CM | POA: Diagnosis not present

## 2015-07-07 DIAGNOSIS — L0291 Cutaneous abscess, unspecified: Secondary | ICD-10-CM | POA: Diagnosis not present

## 2015-07-21 DIAGNOSIS — Z720 Tobacco use: Secondary | ICD-10-CM | POA: Diagnosis not present

## 2015-07-21 DIAGNOSIS — J310 Chronic rhinitis: Secondary | ICD-10-CM | POA: Diagnosis not present

## 2015-07-21 DIAGNOSIS — G4733 Obstructive sleep apnea (adult) (pediatric): Secondary | ICD-10-CM | POA: Diagnosis not present

## 2015-07-21 DIAGNOSIS — J4550 Severe persistent asthma, uncomplicated: Secondary | ICD-10-CM | POA: Diagnosis not present

## 2015-07-30 DIAGNOSIS — G4733 Obstructive sleep apnea (adult) (pediatric): Secondary | ICD-10-CM | POA: Diagnosis not present

## 2015-09-08 DIAGNOSIS — E1165 Type 2 diabetes mellitus with hyperglycemia: Secondary | ICD-10-CM | POA: Diagnosis not present

## 2015-09-08 DIAGNOSIS — E782 Mixed hyperlipidemia: Secondary | ICD-10-CM | POA: Diagnosis not present

## 2015-09-08 DIAGNOSIS — E0590 Thyrotoxicosis, unspecified without thyrotoxic crisis or storm: Secondary | ICD-10-CM | POA: Diagnosis not present

## 2015-09-08 DIAGNOSIS — B182 Chronic viral hepatitis C: Secondary | ICD-10-CM | POA: Diagnosis not present

## 2015-09-08 DIAGNOSIS — R252 Cramp and spasm: Secondary | ICD-10-CM | POA: Diagnosis not present

## 2015-09-08 DIAGNOSIS — I10 Essential (primary) hypertension: Secondary | ICD-10-CM | POA: Diagnosis not present

## 2015-09-08 DIAGNOSIS — E790 Hyperuricemia without signs of inflammatory arthritis and tophaceous disease: Secondary | ICD-10-CM | POA: Diagnosis not present

## 2015-09-08 DIAGNOSIS — R42 Dizziness and giddiness: Secondary | ICD-10-CM | POA: Diagnosis not present

## 2015-10-29 DIAGNOSIS — E119 Type 2 diabetes mellitus without complications: Secondary | ICD-10-CM | POA: Diagnosis not present

## 2015-10-29 DIAGNOSIS — I10 Essential (primary) hypertension: Secondary | ICD-10-CM | POA: Diagnosis not present

## 2015-10-29 DIAGNOSIS — Z9119 Patient's noncompliance with other medical treatment and regimen: Secondary | ICD-10-CM | POA: Diagnosis not present

## 2015-11-23 DIAGNOSIS — E119 Type 2 diabetes mellitus without complications: Secondary | ICD-10-CM | POA: Diagnosis not present

## 2015-11-23 DIAGNOSIS — N912 Amenorrhea, unspecified: Secondary | ICD-10-CM | POA: Diagnosis not present

## 2015-11-23 DIAGNOSIS — Z9119 Patient's noncompliance with other medical treatment and regimen: Secondary | ICD-10-CM | POA: Diagnosis not present

## 2015-11-23 DIAGNOSIS — I10 Essential (primary) hypertension: Secondary | ICD-10-CM | POA: Diagnosis not present

## 2016-01-07 DIAGNOSIS — R232 Flushing: Secondary | ICD-10-CM | POA: Diagnosis not present

## 2016-01-07 DIAGNOSIS — N852 Hypertrophy of uterus: Secondary | ICD-10-CM | POA: Diagnosis not present

## 2016-01-07 DIAGNOSIS — Z124 Encounter for screening for malignant neoplasm of cervix: Secondary | ICD-10-CM | POA: Diagnosis not present

## 2016-01-07 DIAGNOSIS — N915 Oligomenorrhea, unspecified: Secondary | ICD-10-CM | POA: Diagnosis not present

## 2016-01-16 DIAGNOSIS — J189 Pneumonia, unspecified organism: Secondary | ICD-10-CM | POA: Diagnosis not present

## 2016-01-16 DIAGNOSIS — R069 Unspecified abnormalities of breathing: Secondary | ICD-10-CM | POA: Diagnosis not present

## 2016-01-19 DIAGNOSIS — N915 Oligomenorrhea, unspecified: Secondary | ICD-10-CM | POA: Diagnosis not present

## 2016-01-19 DIAGNOSIS — N83202 Unspecified ovarian cyst, left side: Secondary | ICD-10-CM | POA: Diagnosis not present

## 2016-01-19 DIAGNOSIS — R232 Flushing: Secondary | ICD-10-CM | POA: Diagnosis not present

## 2016-01-19 DIAGNOSIS — N852 Hypertrophy of uterus: Secondary | ICD-10-CM | POA: Diagnosis not present

## 2016-01-20 DIAGNOSIS — N83202 Unspecified ovarian cyst, left side: Secondary | ICD-10-CM

## 2016-01-20 DIAGNOSIS — N915 Oligomenorrhea, unspecified: Secondary | ICD-10-CM

## 2016-01-20 HISTORY — DX: Unspecified ovarian cyst, left side: N83.202

## 2016-01-20 HISTORY — DX: Oligomenorrhea, unspecified: N91.5

## 2016-02-17 DIAGNOSIS — E1165 Type 2 diabetes mellitus with hyperglycemia: Secondary | ICD-10-CM | POA: Diagnosis not present

## 2016-02-17 DIAGNOSIS — E119 Type 2 diabetes mellitus without complications: Secondary | ICD-10-CM | POA: Diagnosis not present

## 2016-02-17 DIAGNOSIS — E790 Hyperuricemia without signs of inflammatory arthritis and tophaceous disease: Secondary | ICD-10-CM | POA: Diagnosis not present

## 2016-02-17 DIAGNOSIS — R252 Cramp and spasm: Secondary | ICD-10-CM | POA: Diagnosis not present

## 2016-02-17 DIAGNOSIS — M791 Myalgia: Secondary | ICD-10-CM | POA: Diagnosis not present

## 2016-02-17 DIAGNOSIS — E559 Vitamin D deficiency, unspecified: Secondary | ICD-10-CM | POA: Diagnosis not present

## 2016-02-17 DIAGNOSIS — E782 Mixed hyperlipidemia: Secondary | ICD-10-CM | POA: Diagnosis not present

## 2016-02-17 DIAGNOSIS — I10 Essential (primary) hypertension: Secondary | ICD-10-CM | POA: Diagnosis not present

## 2016-02-17 DIAGNOSIS — E039 Hypothyroidism, unspecified: Secondary | ICD-10-CM | POA: Diagnosis not present

## 2016-02-17 DIAGNOSIS — R42 Dizziness and giddiness: Secondary | ICD-10-CM | POA: Diagnosis not present

## 2016-02-19 DIAGNOSIS — J189 Pneumonia, unspecified organism: Secondary | ICD-10-CM | POA: Diagnosis not present

## 2016-03-15 DIAGNOSIS — R112 Nausea with vomiting, unspecified: Secondary | ICD-10-CM | POA: Diagnosis not present

## 2016-03-15 DIAGNOSIS — E119 Type 2 diabetes mellitus without complications: Secondary | ICD-10-CM | POA: Diagnosis not present

## 2016-03-15 DIAGNOSIS — R0600 Dyspnea, unspecified: Secondary | ICD-10-CM | POA: Diagnosis not present

## 2016-03-15 DIAGNOSIS — M542 Cervicalgia: Secondary | ICD-10-CM | POA: Diagnosis not present

## 2016-03-15 DIAGNOSIS — T25732A Corrosion of third degree of left toe(s) (nail), initial encounter: Secondary | ICD-10-CM | POA: Diagnosis not present

## 2016-03-15 DIAGNOSIS — R05 Cough: Secondary | ICD-10-CM | POA: Diagnosis not present

## 2016-03-15 DIAGNOSIS — I10 Essential (primary) hypertension: Secondary | ICD-10-CM | POA: Diagnosis not present

## 2016-04-22 ENCOUNTER — Emergency Department (HOSPITAL_COMMUNITY): Payer: Medicare HMO

## 2016-04-22 ENCOUNTER — Encounter (HOSPITAL_COMMUNITY): Payer: Self-pay

## 2016-04-22 ENCOUNTER — Inpatient Hospital Stay (HOSPITAL_COMMUNITY)
Admission: EM | Admit: 2016-04-22 | Discharge: 2016-04-25 | DRG: 917 | Disposition: A | Discharge status: Home / Self Care | Payer: Medicare HMO | Attending: Internal Medicine | Admitting: Internal Medicine

## 2016-04-22 DIAGNOSIS — E118 Type 2 diabetes mellitus with unspecified complications: Secondary | ICD-10-CM

## 2016-04-22 DIAGNOSIS — Z794 Long term (current) use of insulin: Secondary | ICD-10-CM

## 2016-04-22 DIAGNOSIS — T401X1A Poisoning by heroin, accidental (unintentional), initial encounter: Principal | ICD-10-CM | POA: Diagnosis present

## 2016-04-22 DIAGNOSIS — Z23 Encounter for immunization: Secondary | ICD-10-CM

## 2016-04-22 DIAGNOSIS — B1920 Unspecified viral hepatitis C without hepatic coma: Secondary | ICD-10-CM | POA: Diagnosis present

## 2016-04-22 DIAGNOSIS — Z880 Allergy status to penicillin: Secondary | ICD-10-CM

## 2016-04-22 DIAGNOSIS — F319 Bipolar disorder, unspecified: Secondary | ICD-10-CM | POA: Diagnosis present

## 2016-04-22 DIAGNOSIS — F17200 Nicotine dependence, unspecified, uncomplicated: Secondary | ICD-10-CM | POA: Diagnosis present

## 2016-04-22 DIAGNOSIS — F1990 Other psychoactive substance use, unspecified, uncomplicated: Secondary | ICD-10-CM | POA: Diagnosis present

## 2016-04-22 DIAGNOSIS — T50904A Poisoning by unspecified drugs, medicaments and biological substances, undetermined, initial encounter: Secondary | ICD-10-CM | POA: Diagnosis not present

## 2016-04-22 DIAGNOSIS — K7010 Alcoholic hepatitis without ascites: Secondary | ICD-10-CM | POA: Diagnosis present

## 2016-04-22 DIAGNOSIS — F10929 Alcohol use, unspecified with intoxication, unspecified: Secondary | ICD-10-CM | POA: Diagnosis present

## 2016-04-22 DIAGNOSIS — F10129 Alcohol abuse with intoxication, unspecified: Secondary | ICD-10-CM | POA: Diagnosis present

## 2016-04-22 DIAGNOSIS — E1165 Type 2 diabetes mellitus with hyperglycemia: Secondary | ICD-10-CM | POA: Diagnosis present

## 2016-04-22 DIAGNOSIS — E872 Acidosis: Secondary | ICD-10-CM | POA: Diagnosis present

## 2016-04-22 DIAGNOSIS — R7401 Elevation of levels of liver transaminase levels: Secondary | ICD-10-CM | POA: Diagnosis present

## 2016-04-22 DIAGNOSIS — Z79899 Other long term (current) drug therapy: Secondary | ICD-10-CM

## 2016-04-22 DIAGNOSIS — I10 Essential (primary) hypertension: Secondary | ICD-10-CM | POA: Diagnosis present

## 2016-04-22 DIAGNOSIS — Z833 Family history of diabetes mellitus: Secondary | ICD-10-CM

## 2016-04-22 DIAGNOSIS — F1410 Cocaine abuse, uncomplicated: Secondary | ICD-10-CM

## 2016-04-22 DIAGNOSIS — J9602 Acute respiratory failure with hypercapnia: Secondary | ICD-10-CM | POA: Diagnosis present

## 2016-04-22 DIAGNOSIS — R0602 Shortness of breath: Secondary | ICD-10-CM

## 2016-04-22 DIAGNOSIS — J441 Chronic obstructive pulmonary disease with (acute) exacerbation: Secondary | ICD-10-CM | POA: Diagnosis present

## 2016-04-22 DIAGNOSIS — J45901 Unspecified asthma with (acute) exacerbation: Secondary | ICD-10-CM | POA: Diagnosis present

## 2016-04-22 DIAGNOSIS — Z823 Family history of stroke: Secondary | ICD-10-CM

## 2016-04-22 DIAGNOSIS — Z881 Allergy status to other antibiotic agents status: Secondary | ICD-10-CM

## 2016-04-22 DIAGNOSIS — F419 Anxiety disorder, unspecified: Secondary | ICD-10-CM | POA: Diagnosis present

## 2016-04-22 DIAGNOSIS — R000 Tachycardia, unspecified: Secondary | ICD-10-CM | POA: Diagnosis present

## 2016-04-22 DIAGNOSIS — Z8249 Family history of ischemic heart disease and other diseases of the circulatory system: Secondary | ICD-10-CM

## 2016-04-22 DIAGNOSIS — E785 Hyperlipidemia, unspecified: Secondary | ICD-10-CM | POA: Diagnosis present

## 2016-04-22 DIAGNOSIS — T380X5A Adverse effect of glucocorticoids and synthetic analogues, initial encounter: Secondary | ICD-10-CM | POA: Diagnosis not present

## 2016-04-22 DIAGNOSIS — Y907 Blood alcohol level of 200-239 mg/100 ml: Secondary | ICD-10-CM | POA: Diagnosis present

## 2016-04-22 DIAGNOSIS — K219 Gastro-esophageal reflux disease without esophagitis: Secondary | ICD-10-CM | POA: Diagnosis present

## 2016-04-22 DIAGNOSIS — R251 Tremor, unspecified: Secondary | ICD-10-CM | POA: Diagnosis present

## 2016-04-22 DIAGNOSIS — T50901A Poisoning by unspecified drugs, medicaments and biological substances, accidental (unintentional), initial encounter: Secondary | ICD-10-CM | POA: Diagnosis present

## 2016-04-22 DIAGNOSIS — J4551 Severe persistent asthma with (acute) exacerbation: Secondary | ICD-10-CM | POA: Diagnosis present

## 2016-04-22 DIAGNOSIS — J9601 Acute respiratory failure with hypoxia: Secondary | ICD-10-CM | POA: Diagnosis present

## 2016-04-22 DIAGNOSIS — R740 Nonspecific elevation of levels of transaminase and lactic acid dehydrogenase [LDH]: Secondary | ICD-10-CM | POA: Diagnosis present

## 2016-04-22 HISTORY — DX: Endocarditis, valve unspecified: I38

## 2016-04-22 LAB — I-STAT CHEM 8, ED
BUN: 10 mg/dL (ref 6–20)
BUN: 21 mg/dL — AB (ref 6–20)
CALCIUM ION: 1.05 mmol/L — AB (ref 1.15–1.40)
CHLORIDE: 82 mmol/L — AB (ref 101–111)
CHLORIDE: 97 mmol/L — AB (ref 101–111)
Calcium, Ion: 1.08 mmol/L — ABNORMAL LOW (ref 1.15–1.40)
Creatinine, Ser: 1 mg/dL (ref 0.44–1.00)
Creatinine, Ser: 1.2 mg/dL — ABNORMAL HIGH (ref 0.44–1.00)
Glucose, Bld: 168 mg/dL — ABNORMAL HIGH (ref 65–99)
Glucose, Bld: 236 mg/dL — ABNORMAL HIGH (ref 65–99)
HEMATOCRIT: 38 % (ref 36.0–46.0)
HEMATOCRIT: 51 % — AB (ref 36.0–46.0)
Hemoglobin: 12.9 g/dL (ref 12.0–15.0)
Hemoglobin: 17.3 g/dL — ABNORMAL HIGH (ref 12.0–15.0)
POTASSIUM: 3.5 mmol/L (ref 3.5–5.1)
Potassium: 2.5 mmol/L — CL (ref 3.5–5.1)
SODIUM: 138 mmol/L (ref 135–145)
Sodium: 132 mmol/L — ABNORMAL LOW (ref 135–145)
TCO2: 26 mmol/L (ref 0–100)
TCO2: 36 mmol/L (ref 0–100)

## 2016-04-22 LAB — CBC WITH DIFFERENTIAL/PLATELET
BASOS ABS: 0 10*3/uL (ref 0.0–0.1)
BASOS PCT: 0 %
Eosinophils Absolute: 0.1 10*3/uL (ref 0.0–0.7)
Eosinophils Relative: 1 %
HEMATOCRIT: 47.6 % — AB (ref 36.0–46.0)
HEMOGLOBIN: 16.6 g/dL — AB (ref 12.0–15.0)
Lymphocytes Relative: 25 %
Lymphs Abs: 3 10*3/uL (ref 0.7–4.0)
MCH: 32.2 pg (ref 26.0–34.0)
MCHC: 34.9 g/dL (ref 30.0–36.0)
MCV: 92.4 fL (ref 78.0–100.0)
Monocytes Absolute: 0.7 10*3/uL (ref 0.1–1.0)
Monocytes Relative: 6 %
NEUTROS ABS: 8.2 10*3/uL — AB (ref 1.7–7.7)
NEUTROS PCT: 68 %
Platelets: 224 10*3/uL (ref 150–400)
RBC: 5.15 MIL/uL — ABNORMAL HIGH (ref 3.87–5.11)
RDW: 13.7 % (ref 11.5–15.5)
WBC: 12 10*3/uL — ABNORMAL HIGH (ref 4.0–10.5)

## 2016-04-22 MED ORDER — ONDANSETRON HCL 4 MG/2ML IJ SOLN
4.0000 mg | Freq: Once | INTRAMUSCULAR | Status: AC
  Administered 2016-04-23: 4 mg via INTRAVENOUS
  Filled 2016-04-22: qty 2

## 2016-04-22 MED ORDER — SODIUM CHLORIDE 0.9 % IV BOLUS (SEPSIS)
1000.0000 mL | Freq: Once | INTRAVENOUS | Status: AC
  Administered 2016-04-22: 1000 mL via INTRAVENOUS

## 2016-04-22 NOTE — ED Triage Notes (Signed)
Patient bib [facility] bystanders state that patient snorted heroin.  Bystanders state that also used cocaine but patient was unsure.  Patient also under the influence of alcohol usure of the amount.  Per EMS 2mg  of narcan administered in the field. Patient was wheezing in route and given nebulizer in the field.

## 2016-04-22 NOTE — ED Provider Notes (Signed)
[facility]-EMERGENCY DEPT Provider Note   CSN: [id number] Arrival date & time: 04/22/16  2330  By signing my name below, I, Jasmyn B. Alexander, attest that this documentation has been prepared under the direction and in the presence of Monnica Saltsman, MD. Electronically Signed: Gillis Ends. Lyn Hollingshead, ED Scribe. 04/22/16. 12:02 AM.  History   Chief Complaint Chief Complaint  Patient presents with  . Drug Overdose    The history is provided by the patient and the EMS personnel. No language interpreter was used.  Ingestion  This is a new problem. The current episode started 3 to 5 hours ago. The problem occurs constantly. The problem has not changed since onset.Pertinent negatives include no chest pain and no shortness of breath. Nothing aggravates the symptoms. Nothing relieves the symptoms. Treatments tried: narcan en route. The treatment provided significant relief.    LEVEL V CAVEAT - ACUITY OF CONDITION  HPI Comments: Emily Larson is a 40 y.o. female brought in by ambulance, with PMHx of HTN, Dm, Depression, and Opioid abuse who presents to the Emergency Department complaining of drug overdose onset PTA. Pt reports that she does not know what happened and that she woke up in [facility]. She admits to using heroin and consuming alcohol today. Per EMS, she notes that bystanders told EMS that she snorted an unknown amount of heroine, consumed alcohol, and used an unknown amount of cocaine. She reports consuming 2 Goody powders earlier today. She was given 2mg  of Narcan and a nebulizer treatment en route to [facility]. Denies any SI.   Past Medical History:  Diagnosis Date  . Allergy   . Arthropathy   . Bipolar 1 disorder (HCC)   . Chronic airway obstruction (HCC)   . Depression   . Diabetes mellitus without complication (HCC)   . Esophageal reflux   . Hepatitis   . Hyperlipidemia   . Hypertension   . Opioid abuse   . Other chronic nonalcoholic liver disease   . Polyneuropathy (HCC)     There are no  active problems to display for this patient.   History reviewed. No pertinent surgical history.  OB History    No data available       Home Medications    Prior to Admission medications   Medication Sig Start Date End Date Taking? Authorizing Provider  albuterol (PROVENTIL) 2 MG tablet     Historical Provider, MD  albuterol-ipratropium (COMBIVENT) 18-103 MCG/ACT inhaler Frequency:PRN   Dosage:0.0     Instructions:  Note:Dose: 18-103MCG 04/10/12   Historical Provider, MD  Buprenorphine HCl-Naloxone HCl (SUBOXONE) 12-3 MG FILM Frequency:QD   Dosage:0.0     Instructions:  Note:Dose: 12 MG-3 MG 04/10/12   Historical Provider, MD  ciprofloxacin (CIPRO) 500 MG tablet Take 1 tablet (500 mg total) by mouth 2 (two) times daily. 04/02/14   Alvan Dame, DPM  clindamycin (CLEOCIN) 150 MG capsule Take 1 capsule (150 mg total) by mouth 4 (four) times daily. 04/02/14   Alvan Dame, DPM  diclofenac sodium (VOLTAREN) 1 % GEL Place onto the skin.    Historical Provider, MD  fluconazole (DIFLUCAN) 150 MG tablet Take 1 tablet (150 mg total) by mouth every other day. 04/09/14   Alvan Dame, DPM  Fluticasone-Salmeterol (ADVAIR DISKUS) 250-50 MCG/DOSE AEPB Inhale into the lungs.    Historical Provider, MD  lisinopril-hydrochlorothiazide (PRINZIDE,ZESTORETIC) 20-12.5 MG per tablet Take by mouth. 04/10/12   Historical Provider, MD  metFORMIN (GLUCOPHAGE) 1000 MG tablet Take 1,000 mg by mouth.    Historical Provider,  MD  methocarbamol (ROBAXIN) 500 MG tablet 1-2 po bid prn 12/27/12   Historical Provider, MD  oxycodone (OXY-IR) 5 MG capsule Take 10 mg by mouth.    Historical Provider, MD  pravastatin (PRAVACHOL) 40 MG tablet Take 40 mg by mouth.    Historical Provider, MD  pregabalin (LYRICA) 150 MG capsule 1 po qam and 2 po qhs 12/27/12   Historical Provider, MD  sulfamethoxazole-trimethoprim (BACTRIM DS) 800-160 MG per tablet Take 1 tablet by mouth 2 (two) times daily. With food  Discontinue Clindamycin start  Septra DS 04/09/14   Alvan Dame, DPM  traMADol (ULTRAM) 50 MG tablet Take 1 tablet (50 mg total) by mouth every 6 (six) hours as needed. 04/16/14   Alvan Dame, DPM    Family History No family history on file.  Social History Social History  Substance Use Topics  . Smoking status: Current Every Day Smoker  . Smokeless tobacco: Never Used  . Alcohol use Yes    Allergies   Keflex [cephalexin] and Penicillins   Review of Systems Review of Systems  Unable to perform ROS: Acuity of condition  Constitutional: Negative for diaphoresis.  Respiratory: Positive for cough and wheezing. Negative for shortness of breath.   Cardiovascular: Negative for chest pain and leg swelling.  Musculoskeletal: Negative for arthralgias.  Psychiatric/Behavioral: Negative for decreased concentration and suicidal ideas.  All other systems reviewed and are negative.  Physical Exam Updated Vital Signs BP 123/62 (BP Location: Right Arm)   Pulse 106   Temp 97.8 F (36.6 C) (Oral)   Resp 26   Ht 5\' 8"  (1.727 m)   Wt 200 lb (90.7 kg)   SpO2 94%   BMI 30.41 kg/m   Physical Exam  Constitutional: She is oriented to person, place, and time. She appears well-developed and well-nourished.  HENT:  Head: Normocephalic.  Mouth/Throat: Oropharynx is clear and moist. No oropharyngeal exudate.  Eyes: Conjunctivae and EOM are normal. Right eye exhibits no discharge. Left eye exhibits no discharge. No scleral icterus.  Pupils pinpoint. Intact phonation  Neck: Normal range of motion. Neck supple. No JVD present. No tracheal deviation present.  Trachea is midline. No stridor or carotid bruits.   Cardiovascular: Regular rhythm, normal heart sounds and intact distal pulses.  Tachycardia present.   No murmur heard. Pulmonary/Chest: Accessory muscle usage present. No stridor. Tachypnea noted. No respiratory distress. She has decreased breath sounds. She has no wheezes. She has no rales.  Abdominal: Soft. Bowel  sounds are normal. She exhibits no distension. There is no tenderness. There is no rebound and no guarding.  Musculoskeletal: Normal range of motion. She exhibits no edema or tenderness.  All compartments soft.  Lymphadenopathy:    She has no cervical adenopathy.  Neurological: She is alert and oriented to person, place, and time. She has normal reflexes. She displays normal reflexes. She exhibits normal muscle tone.  Intact posterior tibial  Skin: Skin is warm and dry. Capillary refill takes less than 2 seconds. She is not diaphoretic.  Psychiatric: She has a normal mood and affect. Her behavior is normal.  Nursing note and vitals reviewed.   ED Treatments / Results  DIAGNOSTIC STUDIES: Vitals:   04/22/16 2328  BP: 123/62  Pulse: 106  Resp: 26  Temp: 97.8 F (36.6 C)   Results for orders placed or performed during the hospital encounter of 04/22/16  CBC with Differential/Platelet  Result Value Ref Range   WBC 12.0 (H) 4.0 - 10.5 K/uL   RBC 5.15 (  H) 3.87 - 5.11 MIL/uL   Hemoglobin 16.6 (H) 12.0 - 15.0 g/dL   HCT 96.0 (H) 45.4 - 09.8 %   MCV 92.4 78.0 - 100.0 fL   MCH 32.2 26.0 - 34.0 pg   MCHC 34.9 30.0 - 36.0 g/dL   RDW 11.9 14.7 - 82.9 %   Platelets 224 150 - 400 K/uL   Neutrophils Relative % 68 %   Neutro Abs 8.2 (H) 1.7 - 7.7 K/uL   Lymphocytes Relative 25 %   Lymphs Abs 3.0 0.7 - 4.0 K/uL   Monocytes Relative 6 %   Monocytes Absolute 0.7 0.1 - 1.0 K/uL   Eosinophils Relative 1 %   Eosinophils Absolute 0.1 0.0 - 0.7 K/uL   Basophils Relative 0 %   Basophils Absolute 0.0 0.0 - 0.1 K/uL  Comprehensive metabolic panel  Result Value Ref Range   Sodium 138 135 - 145 mmol/L   Potassium 3.6 3.5 - 5.1 mmol/L   Chloride 99 (L) 101 - 111 mmol/L   CO2 23 22 - 32 mmol/L   Glucose, Bld 239 (H) 65 - 99 mg/dL   BUN 10 6 - 20 mg/dL   Creatinine, Ser 5.62 0.44 - 1.00 mg/dL   Calcium 8.9 8.9 - 13.0 mg/dL   Total Protein 8.3 (H) 6.5 - 8.1 g/dL   Albumin 4.2 3.5 - 5.0 g/dL    AST 60 (H) 15 - 41 U/L   ALT 75 (H) 14 - 54 U/L   Alkaline Phosphatase 93 38 - 126 U/L   Total Bilirubin 0.7 0.3 - 1.2 mg/dL   GFR calc non Af Amer >60 >60 mL/min   GFR calc Af Amer >60 >60 mL/min   Anion gap 16 (H) 5 - 15  Ethanol  Result Value Ref Range   Alcohol, Ethyl (B) 235 (H) <5 mg/dL  Acetaminophen level  Result Value Ref Range   Acetaminophen (Tylenol), Serum <10 (L) 10 - 30 ug/mL  Salicylate level  Result Value Ref Range   Salicylate Lvl <4.0 2.8 - 30.0 mg/dL  Urine rapid drug screen (hosp performed)  Result Value Ref Range   Opiates POSITIVE (A) NONE DETECTED   Cocaine POSITIVE (A) NONE DETECTED   Benzodiazepines NONE DETECTED NONE DETECTED   Amphetamines NONE DETECTED NONE DETECTED   Tetrahydrocannabinol NONE DETECTED NONE DETECTED   Barbiturates NONE DETECTED NONE DETECTED  Magnesium  Result Value Ref Range   Magnesium 2.0 1.7 - 2.4 mg/dL  Blood gas, arterial ([facility] & [facility] ONLY)  Result Value Ref Range   O2 Content 1.0 L/min   Delivery systems NASAL CANNULA    pH, Arterial 7.244 (L) 7.350 - 7.450   pCO2 arterial 58.4 (H) 32.0 - 48.0 mmHg   pO2, Arterial 59.7 (L) 83.0 - 108.0 mmHg   Bicarbonate 24.5 20.0 - 28.0 mmol/L   Acid-base deficit 3.9 (H) 0.0 - 2.0 mmol/L   O2 Saturation 86.9 %   Patient temperature 97.8    Collection site LEFT RADIAL    Drawn by [id number]    Sample type ARTERIAL DRAW    Allens test (pass/fail) PASS PASS  I-stat chem 8, ed  Result Value Ref Range   Sodium 132 (L) 135 - 145 mmol/L   Potassium 2.5 (LL) 3.5 - 5.1 mmol/L   Chloride 82 (L) 101 - 111 mmol/L   BUN 21 (H) 6 - 20 mg/dL   Creatinine, Ser 6.96 (H) 0.44 - 1.00 mg/dL   Glucose, Bld 295 (H) 65 - 99 mg/dL  Calcium, Ion 1.08 (L) 1.15 - 1.40 mmol/L   TCO2 36 0 - 100 mmol/L   Hemoglobin 12.9 12.0 - 15.0 g/dL   HCT 40.938.0 81.136.0 - 91.446.0 %   Comment NOTIFIED PHYSICIAN   I-Stat Beta hCG blood, ED ([facility], [facility], [facility] only)  Result Value Ref Range   I-stat hCG, quantitative <5.0 <5 mIU/mL   Comment  3          I-stat troponin, ED  Result Value Ref Range   Troponin i, poc 0.01 0.00 - 0.08 ng/mL   Comment 3          I-stat chem 8, ed  Result Value Ref Range   Sodium 138 135 - 145 mmol/L   Potassium 3.5 3.5 - 5.1 mmol/L   Chloride 97 (L) 101 - 111 mmol/L   BUN 10 6 - 20 mg/dL   Creatinine, Ser 7.821.00 0.44 - 1.00 mg/dL   Glucose, Bld 956236 (H) 65 - 99 mg/dL   Calcium, Ion 2.131.05 (L) 1.15 - 1.40 mmol/L   TCO2 26 0 - 100 mmol/L   Hemoglobin 17.3 (H) 12.0 - 15.0 g/dL   HCT 08.651.0 (H) 57.836.0 - 46.946.0 %   Dg Chest Portable 1 View  Result Date: 04/22/2016 CLINICAL DATA:  Drug overdose. EXAM: PORTABLE CHEST 1 VIEW COMPARISON:  02/19/2016 FINDINGS: The heart size and mediastinal contours are within normal limits. Both lungs are clear. The visualized skeletal structures are unremarkable. IMPRESSION: No active disease. Electronically Signed   By: Myles RosenthalJohn  Stahl M.D.   On: 04/22/2016 23:59     Labs (all labs ordered are listed, but only abnormal results are displayed)  EKG  EKG Interpretation  Date/Time:  Saturday April 22 2016 23:33:41 EDT Ventricular Rate:  110 PR Interval:    QRS Duration: 94 QT Interval:  350 QTC Calculation: 474 R Axis:   84 Text Interpretation:  Sinus tachycardia Inferior infarct, old Confirmed by [facility]  MD, Navada Osterhout ([id number]) on 04/22/2016 11:38:02 PM       Radiology No results found.  Procedures Procedures (including critical care time)  Medications Ordered in ED Medications  albuterol (PROVENTIL,VENTOLIN) solution continuous neb (not administered)  albuterol (PROVENTIL) (2.5 MG/3ML) 0.083% nebulizer solution 2.5 mg (not administered)  sodium chloride 0.9 % bolus 1,000 mL (1,000 mLs Intravenous New Bag/Given 04/22/16 2337)  ondansetron (ZOFRAN) injection 4 mg (4 mg Intravenous Given 04/23/16 0048)  sodium chloride 0.9 % bolus 1,000 mL (1,000 mLs Intravenous New Bag/Given 04/23/16 0048)  albuterol (PROVENTIL) (2.5 MG/3ML) 0.083% nebulizer solution 5 mg (5 mg  Nebulization Given 04/23/16 0053)  methylPREDNISolone sodium succinate (SOLU-MEDROL) 125 mg/2 mL injection 125 mg (125 mg Intravenous Given 04/23/16 0051)    I have reviewed the triage vital signs and the nursing notes.  Pertinent labs & imaging results that were available during my care of the patient were reviewed by me and considered in my medical decision making (see chart for details).  Clinical Course    BIPAP initiated by me  MDM Reviewed: previous chart, nursing note and vitals Interpretation: labs, ECG and x-ray (positive for opioids and cocaine, normal troponin by me.  NACPD on cxr by me.  CO2 retained by me on ABG) Total time providing critical care: > 105 minutes. This excludes time spent performing separately reportable procedures and services. Consults: admitting MD  CRITICAL CARE Performed by: Jasmine AwePALUMBO-RASCH,Avina Eberle K Total critical care time: 120 minutes Critical care time was exclusive of separately billable procedures and treating other patients. Critical care was necessary to  treat or prevent imminent or life-threatening deterioration. Critical care was time spent personally by me on the following activities: development of treatment plan with patient and/or surrogate as well as nursing, discussions with consultants, evaluation of patient's response to treatment, examination of patient, obtaining history from patient or surrogate, ordering and performing treatments and interventions, ordering and review of laboratory studies, ordering and review of radiographic studies, pulse oximetry and re-evaluation of patient's condition. Final Clinical Impressions(s) / ED Diagnoses   Final diagnoses:  None    New Prescriptions New Prescriptions   No medications on file   I personally performed the services described in this documentation, which was scribed in my presence. The recorded information has been reviewed and is accurate.     Cy Blamer, MD 04/23/16 [phone]

## 2016-04-23 ENCOUNTER — Encounter (HOSPITAL_COMMUNITY): Payer: Self-pay | Admitting: Emergency Medicine

## 2016-04-23 DIAGNOSIS — R7401 Elevation of levels of liver transaminase levels: Secondary | ICD-10-CM | POA: Diagnosis present

## 2016-04-23 DIAGNOSIS — F319 Bipolar disorder, unspecified: Secondary | ICD-10-CM | POA: Diagnosis present

## 2016-04-23 DIAGNOSIS — J9602 Acute respiratory failure with hypercapnia: Secondary | ICD-10-CM | POA: Diagnosis present

## 2016-04-23 DIAGNOSIS — Z23 Encounter for immunization: Secondary | ICD-10-CM | POA: Diagnosis not present

## 2016-04-23 DIAGNOSIS — T50901A Poisoning by unspecified drugs, medicaments and biological substances, accidental (unintentional), initial encounter: Secondary | ICD-10-CM | POA: Diagnosis present

## 2016-04-23 DIAGNOSIS — K7010 Alcoholic hepatitis without ascites: Secondary | ICD-10-CM | POA: Diagnosis present

## 2016-04-23 DIAGNOSIS — J45901 Unspecified asthma with (acute) exacerbation: Secondary | ICD-10-CM

## 2016-04-23 DIAGNOSIS — F10129 Alcohol abuse with intoxication, unspecified: Secondary | ICD-10-CM | POA: Diagnosis present

## 2016-04-23 DIAGNOSIS — F10120 Alcohol abuse with intoxication, uncomplicated: Secondary | ICD-10-CM | POA: Diagnosis not present

## 2016-04-23 DIAGNOSIS — F1990 Other psychoactive substance use, unspecified, uncomplicated: Secondary | ICD-10-CM

## 2016-04-23 DIAGNOSIS — J9601 Acute respiratory failure with hypoxia: Secondary | ICD-10-CM

## 2016-04-23 DIAGNOSIS — J4551 Severe persistent asthma with (acute) exacerbation: Secondary | ICD-10-CM | POA: Diagnosis present

## 2016-04-23 DIAGNOSIS — R740 Nonspecific elevation of levels of transaminase and lactic acid dehydrogenase [LDH]: Secondary | ICD-10-CM

## 2016-04-23 DIAGNOSIS — Z8249 Family history of ischemic heart disease and other diseases of the circulatory system: Secondary | ICD-10-CM | POA: Diagnosis not present

## 2016-04-23 DIAGNOSIS — F17200 Nicotine dependence, unspecified, uncomplicated: Secondary | ICD-10-CM | POA: Diagnosis present

## 2016-04-23 DIAGNOSIS — K219 Gastro-esophageal reflux disease without esophagitis: Secondary | ICD-10-CM | POA: Diagnosis present

## 2016-04-23 DIAGNOSIS — T401X1A Poisoning by heroin, accidental (unintentional), initial encounter: Secondary | ICD-10-CM | POA: Diagnosis present

## 2016-04-23 DIAGNOSIS — B1920 Unspecified viral hepatitis C without hepatic coma: Secondary | ICD-10-CM | POA: Diagnosis present

## 2016-04-23 DIAGNOSIS — F10929 Alcohol use, unspecified with intoxication, unspecified: Secondary | ICD-10-CM | POA: Diagnosis present

## 2016-04-23 DIAGNOSIS — E872 Acidosis: Secondary | ICD-10-CM | POA: Diagnosis present

## 2016-04-23 DIAGNOSIS — F419 Anxiety disorder, unspecified: Secondary | ICD-10-CM | POA: Diagnosis present

## 2016-04-23 DIAGNOSIS — Y907 Blood alcohol level of 200-239 mg/100 ml: Secondary | ICD-10-CM | POA: Diagnosis present

## 2016-04-23 DIAGNOSIS — R000 Tachycardia, unspecified: Secondary | ICD-10-CM | POA: Diagnosis present

## 2016-04-23 DIAGNOSIS — Z823 Family history of stroke: Secondary | ICD-10-CM | POA: Diagnosis not present

## 2016-04-23 DIAGNOSIS — T380X5A Adverse effect of glucocorticoids and synthetic analogues, initial encounter: Secondary | ICD-10-CM | POA: Diagnosis not present

## 2016-04-23 DIAGNOSIS — E1165 Type 2 diabetes mellitus with hyperglycemia: Secondary | ICD-10-CM | POA: Diagnosis present

## 2016-04-23 DIAGNOSIS — Z833 Family history of diabetes mellitus: Secondary | ICD-10-CM | POA: Diagnosis not present

## 2016-04-23 DIAGNOSIS — J441 Chronic obstructive pulmonary disease with (acute) exacerbation: Secondary | ICD-10-CM | POA: Diagnosis present

## 2016-04-23 DIAGNOSIS — E785 Hyperlipidemia, unspecified: Secondary | ICD-10-CM | POA: Diagnosis present

## 2016-04-23 DIAGNOSIS — R251 Tremor, unspecified: Secondary | ICD-10-CM | POA: Diagnosis present

## 2016-04-23 DIAGNOSIS — I10 Essential (primary) hypertension: Secondary | ICD-10-CM | POA: Diagnosis present

## 2016-04-23 HISTORY — DX: Poisoning by unspecified drugs, medicaments and biological substances, accidental (unintentional), initial encounter: T50.901A

## 2016-04-23 HISTORY — DX: Elevation of levels of liver transaminase levels: R74.01

## 2016-04-23 HISTORY — DX: Unspecified asthma with (acute) exacerbation: J45.901

## 2016-04-23 HISTORY — DX: Alcohol use, unspecified with intoxication, unspecified: F10.929

## 2016-04-23 HISTORY — DX: Acute respiratory failure with hypoxia: J96.01

## 2016-04-23 HISTORY — DX: Other psychoactive substance use, unspecified, uncomplicated: F19.90

## 2016-04-23 LAB — GLUCOSE, CAPILLARY
GLUCOSE-CAPILLARY: 365 mg/dL — AB (ref 65–99)
GLUCOSE-CAPILLARY: 424 mg/dL — AB (ref 65–99)
GLUCOSE-CAPILLARY: 320 mg/dL — AB (ref 65–99)
GLUCOSE-CAPILLARY: 304 mg/dL — AB (ref 65–99)
Glucose-Capillary: 307 mg/dL — ABNORMAL HIGH (ref 65–99)

## 2016-04-23 LAB — BLOOD GAS, ARTERIAL
ACID-BASE DEFICIT: 8.1 mmol/L — AB (ref 0.0–2.0)
Acid-base deficit: 3.9 mmol/L — ABNORMAL HIGH (ref 0.0–2.0)
BICARBONATE: 21.1 mmol/L (ref 20.0–28.0)
Bicarbonate: 24.5 mmol/L (ref 20.0–28.0)
DRAWN BY: 422461
DRAWN BY: 422461
O2 Content: 1 L/min
O2 Content: 4 L/min
O2 SAT: 86.9 %
O2 SAT: 90.1 %
PATIENT TEMPERATURE: 97.8
PATIENT TEMPERATURE: 97.6
pCO2 arterial: 58.4 mmHg — ABNORMAL HIGH (ref 32.0–48.0)
pCO2 arterial: 57.8 mmHg — ABNORMAL HIGH (ref 32.0–48.0)
pH, Arterial: 7.244 — ABNORMAL LOW (ref 7.350–7.450)
pH, Arterial: 7.184 — CL (ref 7.350–7.450)
pO2, Arterial: 59.7 mmHg — ABNORMAL LOW (ref 83.0–108.0)
pO2, Arterial: 69.6 mmHg — ABNORMAL LOW (ref 83.0–108.0)

## 2016-04-23 LAB — CBC
HEMATOCRIT: 44.5 % (ref 36.0–46.0)
Hemoglobin: 14.9 g/dL (ref 12.0–15.0)
MCH: 31.6 pg (ref 26.0–34.0)
MCHC: 33.5 g/dL (ref 30.0–36.0)
MCV: 94.5 fL (ref 78.0–100.0)
PLATELETS: 180 10*3/uL (ref 150–400)
RBC: 4.71 MIL/uL (ref 3.87–5.11)
RDW: 13.9 % (ref 11.5–15.5)
WBC: 18.4 10*3/uL — ABNORMAL HIGH (ref 4.0–10.5)

## 2016-04-23 LAB — COMPREHENSIVE METABOLIC PANEL
ALT: 70 U/L — AB (ref 14–54)
ALT: 75 U/L — AB (ref 14–54)
AST: 46 U/L — AB (ref 15–41)
AST: 60 U/L — AB (ref 15–41)
Albumin: 4.2 g/dL (ref 3.5–5.0)
Albumin: 4.2 g/dL (ref 3.5–5.0)
Alkaline Phosphatase: 81 U/L (ref 38–126)
Alkaline Phosphatase: 93 U/L (ref 38–126)
Anion gap: 13 (ref 5–15)
Anion gap: 16 — ABNORMAL HIGH (ref 5–15)
BUN: 10 mg/dL (ref 6–20)
BUN: 13 mg/dL (ref 6–20)
CHLORIDE: 101 mmol/L (ref 101–111)
CHLORIDE: 99 mmol/L — AB (ref 101–111)
CO2: 23 mmol/L (ref 22–32)
CO2: 23 mmol/L (ref 22–32)
CREATININE: 0.75 mg/dL (ref 0.44–1.00)
CREATININE: 0.92 mg/dL (ref 0.44–1.00)
Calcium: 8.3 mg/dL — ABNORMAL LOW (ref 8.9–10.3)
Calcium: 8.9 mg/dL (ref 8.9–10.3)
GFR calc non Af Amer: >60 mL/min (ref >60)
GFR calc non Af Amer: >60 mL/min (ref >60)
Glucose, Bld: 239 mg/dL — ABNORMAL HIGH (ref 65–99)
Glucose, Bld: 296 mg/dL — ABNORMAL HIGH (ref 65–99)
POTASSIUM: 4.6 mmol/L (ref 3.5–5.1)
Potassium: 3.6 mmol/L (ref 3.5–5.1)
SODIUM: 137 mmol/L (ref 135–145)
SODIUM: 138 mmol/L (ref 135–145)
Total Bilirubin: 0.5 mg/dL (ref 0.3–1.2)
Total Bilirubin: 0.7 mg/dL (ref 0.3–1.2)
Total Protein: 7.7 g/dL (ref 6.5–8.1)
Total Protein: 8.3 g/dL — ABNORMAL HIGH (ref 6.5–8.1)

## 2016-04-23 LAB — MRSA PCR SCREENING: MRSA BY PCR: NEGATIVE

## 2016-04-23 LAB — RAPID URINE DRUG SCREEN, HOSP PERFORMED
Amphetamines: NOT DETECTED
BARBITURATES: NOT DETECTED
BENZODIAZEPINES: NOT DETECTED
COCAINE: POSITIVE — AB
OPIATES: POSITIVE — AB
Tetrahydrocannabinol: NOT DETECTED

## 2016-04-23 LAB — I-STAT BETA HCG BLOOD, ED (MC, WL, AP ONLY): I-stat hCG, quantitative: <5 m[IU]/mL (ref <5)

## 2016-04-23 LAB — ACETAMINOPHEN LEVEL

## 2016-04-23 LAB — SALICYLATE LEVEL: Salicylate Lvl: <4 mg/dL (ref 2.8–30.0)

## 2016-04-23 LAB — HIV ANTIBODY (ROUTINE TESTING W REFLEX): HIV Screen 4th Generation wRfx: NONREACTIVE

## 2016-04-23 LAB — MAGNESIUM: Magnesium: 2 mg/dL (ref 1.7–2.4)

## 2016-04-23 LAB — GLUCOSE, RANDOM: GLUCOSE: 373 mg/dL — AB (ref 65–99)

## 2016-04-23 LAB — I-STAT TROPONIN, ED: Troponin i, poc: 0.01 ng/mL (ref 0.00–0.08)

## 2016-04-23 LAB — ETHANOL: Alcohol, Ethyl (B): 235 mg/dL — ABNORMAL HIGH (ref <5)

## 2016-04-23 MED ORDER — ALBUTEROL SULFATE (2.5 MG/3ML) 0.083% IN NEBU
2.5000 mg | INHALATION_SOLUTION | RESPIRATORY_TRACT | Status: DC | PRN
  Administered 2016-04-24: 2.5 mg via RESPIRATORY_TRACT

## 2016-04-23 MED ORDER — LORAZEPAM 2 MG/ML IJ SOLN: 1.0000 mg | INTRAMUSCULAR | Status: DC | PRN

## 2016-04-23 MED ORDER — HYDROCHLOROTHIAZIDE 12.5 MG PO CAPS: 12.5000 mg | ORAL_CAPSULE | Freq: Every day | ORAL | Status: DC

## 2016-04-23 MED ORDER — INSULIN GLARGINE 100 UNIT/ML ~~LOC~~ SOLN: 35.0000 [IU] | Freq: Every day | SUBCUTANEOUS | Status: DC

## 2016-04-23 MED ORDER — OLANZAPINE 7.5 MG PO TABS
15.0000 mg | ORAL_TABLET | Freq: Every day | ORAL | Status: DC
  Administered 2016-04-23 – 2016-04-25 (×3): 15 mg via ORAL
  Filled 2016-04-23 (×3): qty 2

## 2016-04-23 MED ORDER — LISINOPRIL 20 MG PO TABS: 20.0000 mg | ORAL_TABLET | Freq: Every day | ORAL | Status: DC

## 2016-04-23 MED ORDER — METHYLPREDNISOLONE SODIUM SUCC 40 MG IJ SOLR
40.0000 mg | Freq: Four times a day (QID) | INTRAMUSCULAR | Status: DC
Start: 2016-04-23 — End: 2016-04-23
  Filled 2016-04-23: qty 1

## 2016-04-23 MED ORDER — SODIUM CHLORIDE 0.9% FLUSH
3.0000 mL | Freq: Two times a day (BID) | INTRAVENOUS | Status: DC
  Administered 2016-04-23 – 2016-04-25 (×4): 3 mL via INTRAVENOUS

## 2016-04-23 MED ORDER — HYDROCHLOROTHIAZIDE 12.5 MG PO CAPS
12.5000 mg | ORAL_CAPSULE | Freq: Every day | ORAL | Status: DC
  Administered 2016-04-23: 12.5 mg via ORAL
  Filled 2016-04-23 (×2): qty 1

## 2016-04-23 MED ORDER — ALBUTEROL SULFATE (2.5 MG/3ML) 0.083% IN NEBU
5.0000 mg | INHALATION_SOLUTION | Freq: Once | RESPIRATORY_TRACT | Status: AC
  Administered 2016-04-23: 5 mg via RESPIRATORY_TRACT
  Filled 2016-04-23: qty 6

## 2016-04-23 MED ORDER — FOLIC ACID 1 MG PO TABS
1.0000 mg | ORAL_TABLET | Freq: Every day | ORAL | Status: DC
  Administered 2016-04-23 – 2016-04-24 (×2): 1 mg via ORAL
  Filled 2016-04-23 (×2): qty 1

## 2016-04-23 MED ORDER — IPRATROPIUM BROMIDE 0.02 % IN SOLN: 0.5000 mg | Freq: Four times a day (QID) | RESPIRATORY_TRACT | Status: DC

## 2016-04-23 MED ORDER — LISINOPRIL-HYDROCHLOROTHIAZIDE 20-12.5 MG PO TABS: 1.0000 | ORAL_TABLET | Freq: Every day | ORAL | Status: DC

## 2016-04-23 MED ORDER — INSULIN ASPART 100 UNIT/ML ~~LOC~~ SOLN
0.0000 [IU] | Freq: Every day | SUBCUTANEOUS | Status: DC
  Administered 2016-04-23: 4 [IU] via SUBCUTANEOUS
  Administered 2016-04-24: 5 [IU] via SUBCUTANEOUS

## 2016-04-23 MED ORDER — IPRATROPIUM-ALBUTEROL 0.5-2.5 (3) MG/3ML IN SOLN
3.0000 mL | RESPIRATORY_TRACT | Status: DC
Start: 2016-04-23 — End: 2016-04-23
  Administered 2016-04-23: 3 mL via RESPIRATORY_TRACT
  Filled 2016-04-23: qty 3

## 2016-04-23 MED ORDER — BUSPIRONE HCL 5 MG PO TABS
15.0000 mg | ORAL_TABLET | Freq: Three times a day (TID) | ORAL | Status: DC
  Administered 2016-04-23 – 2016-04-25 (×8): 15 mg via ORAL
  Filled 2016-04-23 (×3): qty 1
  Filled 2016-04-23 (×2): qty 3
  Filled 2016-04-23 (×3): qty 1
  Filled 2016-04-23: qty 3

## 2016-04-23 MED ORDER — METHYLPREDNISOLONE SODIUM SUCC 125 MG IJ SOLR
125.0000 mg | Freq: Once | INTRAMUSCULAR | Status: AC
  Administered 2016-04-23: 125 mg via INTRAVENOUS
  Filled 2016-04-23: qty 2

## 2016-04-23 MED ORDER — INSULIN ASPART 100 UNIT/ML ~~LOC~~ SOLN
0.0000 [IU] | Freq: Three times a day (TID) | SUBCUTANEOUS | Status: DC
  Administered 2016-04-23: 20 [IU] via SUBCUTANEOUS
  Administered 2016-04-23: 15 [IU] via SUBCUTANEOUS
  Administered 2016-04-23 – 2016-04-24 (×2): 20 [IU] via SUBCUTANEOUS
  Administered 2016-04-24: 7 [IU] via SUBCUTANEOUS
  Administered 2016-04-24: 11 [IU] via SUBCUTANEOUS
  Administered 2016-04-25: 3 [IU] via SUBCUTANEOUS
  Administered 2016-04-25: 4 [IU] via SUBCUTANEOUS

## 2016-04-23 MED ORDER — ALBUTEROL SULFATE (2.5 MG/3ML) 0.083% IN NEBU: 2.5000 mg | INHALATION_SOLUTION | Freq: Four times a day (QID) | RESPIRATORY_TRACT | Status: DC

## 2016-04-23 MED ORDER — SODIUM CHLORIDE 0.9 % IV BOLUS (SEPSIS)
1000.0000 mL | Freq: Once | INTRAVENOUS | Status: AC
  Administered 2016-04-23: 1000 mL via INTRAVENOUS

## 2016-04-23 MED ORDER — IPRATROPIUM-ALBUTEROL 0.5-2.5 (3) MG/3ML IN SOLN
3.0000 mL | Freq: Four times a day (QID) | RESPIRATORY_TRACT | Status: DC
Start: 2016-04-23 — End: 2016-04-23

## 2016-04-23 MED ORDER — METHYLPREDNISOLONE SODIUM SUCC 40 MG IJ SOLR: 40.0000 mg | Freq: Two times a day (BID) | INTRAMUSCULAR | Status: DC

## 2016-04-23 MED ORDER — BUPRENORPHINE HCL 8 MG SL SUBL: 8.0000 mg | SUBLINGUAL_TABLET | Freq: Two times a day (BID) | SUBLINGUAL | Status: DC

## 2016-04-23 MED ORDER — LORAZEPAM 2 MG/ML IJ SOLN
2.0000 mg | INTRAMUSCULAR | Status: DC | PRN
Start: 2016-04-23 — End: 2016-04-24
  Administered 2016-04-23 – 2016-04-24 (×2): 2 mg via INTRAVENOUS
  Filled 2016-04-23 (×2): qty 1

## 2016-04-23 MED ORDER — NICOTINE 21 MG/24HR TD PT24
21.0000 mg | MEDICATED_PATCH | Freq: Every day | TRANSDERMAL | Status: DC
  Administered 2016-04-23 – 2016-04-25 (×3): 21 mg via TRANSDERMAL
  Filled 2016-04-23 (×3): qty 1

## 2016-04-23 MED ORDER — PREGABALIN 75 MG PO CAPS
200.0000 mg | ORAL_CAPSULE | Freq: Every day | ORAL | Status: DC
  Administered 2016-04-24 – 2016-04-25 (×2): 200 mg via ORAL
  Filled 2016-04-23: qty 2
  Filled 2016-04-23: qty 1

## 2016-04-23 MED ORDER — ORAL CARE MOUTH RINSE
15.0000 mL | Freq: Two times a day (BID) | OROMUCOSAL | Status: DC
  Administered 2016-04-23 – 2016-04-25 (×5): 15 mL via OROMUCOSAL

## 2016-04-23 MED ORDER — PREGABALIN 100 MG PO CAPS
200.0000 mg | ORAL_CAPSULE | Freq: Every day | ORAL | Status: DC
  Administered 2016-04-23: 200 mg via ORAL
  Filled 2016-04-23: qty 2

## 2016-04-23 MED ORDER — METHYLPREDNISOLONE SODIUM SUCC 40 MG IJ SOLR
40.0000 mg | Freq: Two times a day (BID) | INTRAMUSCULAR | Status: DC
  Administered 2016-04-23: 40 mg via INTRAVENOUS
  Filled 2016-04-23: qty 1

## 2016-04-23 MED ORDER — ACETAMINOPHEN 325 MG PO TABS: 650.0000 mg | ORAL_TABLET | Freq: Four times a day (QID) | ORAL | Status: DC | PRN

## 2016-04-23 MED ORDER — PREGABALIN 75 MG PO CAPS
400.0000 mg | ORAL_CAPSULE | Freq: Every day | ORAL | Status: DC
  Administered 2016-04-23 – 2016-04-24 (×2): 400 mg via ORAL
  Filled 2016-04-23: qty 5
  Filled 2016-04-23: qty 4

## 2016-04-23 MED ORDER — IPRATROPIUM-ALBUTEROL 0.5-2.5 (3) MG/3ML IN SOLN
3.0000 mL | Freq: Four times a day (QID) | RESPIRATORY_TRACT | Status: DC
  Administered 2016-04-23 – 2016-04-25 (×9): 3 mL via RESPIRATORY_TRACT
  Filled 2016-04-23 (×9): qty 3

## 2016-04-23 MED ORDER — ATORVASTATIN CALCIUM 40 MG PO TABS
40.0000 mg | ORAL_TABLET | Freq: Every evening | ORAL | Status: DC
  Administered 2016-04-23 – 2016-04-24 (×2): 40 mg via ORAL
  Filled 2016-04-23 (×2): qty 1

## 2016-04-23 MED ORDER — INSULIN GLARGINE 100 UNIT/ML ~~LOC~~ SOLN
35.0000 [IU] | Freq: Every day | SUBCUTANEOUS | Status: DC
  Administered 2016-04-23: 35 [IU] via SUBCUTANEOUS
  Filled 2016-04-23: qty 0.35

## 2016-04-23 MED ORDER — PREGABALIN 75 MG PO CAPS: 150.0000 mg | ORAL_CAPSULE | Freq: Two times a day (BID) | ORAL | Status: DC

## 2016-04-23 MED ORDER — VITAMIN B-1 100 MG PO TABS
100.0000 mg | ORAL_TABLET | Freq: Every day | ORAL | Status: DC
  Administered 2016-04-23 – 2016-04-24 (×2): 100 mg via ORAL
  Filled 2016-04-23 (×2): qty 1

## 2016-04-23 MED ORDER — ACETAMINOPHEN 650 MG RE SUPP: 650.0000 mg | Freq: Four times a day (QID) | RECTAL | Status: DC | PRN

## 2016-04-23 MED ORDER — PANTOPRAZOLE SODIUM 40 MG PO TBEC
40.0000 mg | DELAYED_RELEASE_TABLET | Freq: Two times a day (BID) | ORAL | Status: DC
  Administered 2016-04-23 – 2016-04-25 (×5): 40 mg via ORAL
  Filled 2016-04-23 (×5): qty 1

## 2016-04-23 MED ORDER — PREGABALIN 50 MG PO CAPS
400.0000 mg | ORAL_CAPSULE | Freq: Every day | ORAL | Status: DC
  Filled 2016-04-23: qty 16

## 2016-04-23 MED ORDER — PNEUMOCOCCAL VAC POLYVALENT 25 MCG/0.5ML IJ INJ
0.5000 mL | INJECTION | INTRAMUSCULAR | Status: AC
  Administered 2016-04-24: 0.5 mL via INTRAMUSCULAR
  Filled 2016-04-23 (×2): qty 0.5

## 2016-04-23 MED ORDER — ALBUTEROL SULFATE (2.5 MG/3ML) 0.083% IN NEBU
2.5000 mg | INHALATION_SOLUTION | RESPIRATORY_TRACT | Status: DC | PRN
  Filled 2016-04-23: qty 3

## 2016-04-23 MED ORDER — ALBUTEROL (5 MG/ML) CONTINUOUS INHALATION SOLN
10.0000 mg/h | INHALATION_SOLUTION | Freq: Once | RESPIRATORY_TRACT | Status: AC
  Administered 2016-04-23: 10 mg/h via RESPIRATORY_TRACT
  Filled 2016-04-23: qty 20

## 2016-04-23 MED ORDER — ONDANSETRON HCL 4 MG/2ML IJ SOLN: 4.0000 mg | Freq: Four times a day (QID) | INTRAMUSCULAR | Status: DC | PRN

## 2016-04-23 MED ORDER — INSULIN ASPART 100 UNIT/ML ~~LOC~~ SOLN
5.0000 [IU] | Freq: Once | SUBCUTANEOUS | Status: AC
  Administered 2016-04-23: 5 [IU] via SUBCUTANEOUS

## 2016-04-23 MED ORDER — ONDANSETRON HCL 4 MG PO TABS: 4.0000 mg | ORAL_TABLET | Freq: Four times a day (QID) | ORAL | Status: DC | PRN

## 2016-04-23 MED ORDER — ENOXAPARIN SODIUM 40 MG/0.4ML ~~LOC~~ SOLN
40.0000 mg | SUBCUTANEOUS | Status: DC
  Administered 2016-04-23 – 2016-04-25 (×3): 40 mg via SUBCUTANEOUS
  Filled 2016-04-23 (×3): qty 0.4

## 2016-04-23 NOTE — Progress Notes (Signed)
Writer gave report to receiving nurse in stepdown, Shay. Pt to transfer to room 1233.

## 2016-04-23 NOTE — Progress Notes (Signed)
RT removed PT from BiPAP - placed on 2 lpm  (as used at home). PT does not appear to be in respiratory distress at this time. RN aware.

## 2016-04-23 NOTE — Progress Notes (Signed)
12 pm CBG 424. Ordered lab verification. MD paged with results. Novolog given.

## 2016-04-23 NOTE — ED Notes (Signed)
Called RT per provider verbal wants breathing tx and RT assessment.

## 2016-04-23 NOTE — H&P (Signed)
History and Physical  Patient Name: Emily Larson Oneil     ZOX:[id number]    DOB: 12/06/1975    DOA: 04/22/2016 PCP: Treasa SchoolLAND, PHILLIP, PA-C   Patient coming from: Home  Chief Complaint: Overdose  HPI: Emily Larson Sollenberger is a 40 y.o. female with a past medical history significant for severe persistent asthma/COPD followed [place] Pul[place]ology in [place], last prednisone ~3-4 months ago, former IVDU and endocarditis in 2015, IDDM, HTN, and nighttime hypoxia without OSA who presents with heroin overdose.  Per report, the patient was at home with her husband tonight, snorted heroin (only the second time in a long time), used cocaine, and then evidently collapsed or stopped breathing and EMS were called.  Narcan was administered in the field with some improvement and the patient was also given albuterol because of wheezing.    ED course: -Afebrile, heart rate 100s, respirations 26, requiring supplemental oxygen to maintain O2 sat greater than 92%. -Na 138, K 3.6, Cr 0.7 (baseline), WBC 12 K, Hgb 16, AST and ALT 60 and 75 -UDS positive for opiates and cocaine -Alcohol 235 mm grams per deciliter -Acetaminophen and salicylates negative -Troponin negative -CXR clear -ABG showed pH 7.24, PCO2 58, PO2 59 -She was given Solu-Medrol and albuterol, with some improvement in her clinical status -She was placed on BiPAP but removed this and was more alert and protecting her airway and saturating well O2 by nasal cannula -TRH were asked to evaluate for admission  She describes daily use of albuterol, frequent prednisone use for her lung disease (she describes this as both asthma and COPD, is followed [place] Dr. Delbert Phen[place]haudhry [place]  She is not forthcoming with her previous IVDU and endocarditis.       ROS: Review of Systems  Respiratory: Positive for shortness of breath and wheezing.   Neurological: Positive for loss of consciousness.  Psychiatric/Behavioral: Positive for substance abuse. Negative for depression and suicidal  ideas.  All other systems reviewed and are negative.         Past Medical History:  Diagnosis Date  . Allergy   . Arthropathy   . Bipolar 1 disorder (HCC)   . Chronic airway obstruction (HCC)   . Depression   . Diabetes mellitus without complication (HCC)   . Endocarditis    Mitral valve[facility]  . Esophageal reflux   . Hepatitis   . Hyperlipidemia   . Hypertension   . Opioid abuse   . Other chronic nonalcoholic liver disease   . Polyneuropathy (HCC)     History reviewed. No pertinent surgical history.  Social History: Patient lives with her husband and 2 children.  The patient walks unassisted.  She is on Disability.  She smokes.  She uses alcohol and illicit drugs.    Allergies  Allergen Reactions  . Keflex [Cephalexin] Itching and Rash  . Penicillins Rash    Has patient had a PCN reaction causing immediate rash, facial/tongue/throat swelling, SOB or lightheadedness with hypotension:Yes Has patient had a PCN reaction causing severe rash involving mucus membranes or skin necrosis:no Has patient had a PCN reaction that required hospitalization:unsure Has patient had a PCN reaction occurring within the last 10 years:No If all of the above answers are "NO", then may proceed with Cephalosporin use.     Family history: family history includes ADD / ADHD in her other; Alcohol abuse in her other; Diabetes in her other; Hypertension in her other; Stroke in her other.  Prior to Admission medications   Medication Sig Start  Date End Date Taking? Authorizing Provider  atorvastatin (LIPITOR) 40 MG tablet Take 40 mg by mouth every evening.   Yes Historical Provider, MD  busPIRone (BUSPAR) 15 MG tablet Take 15 mg by mouth 3 (three) times daily. 04/12/16  Yes Historical Provider, MD  JANUVIA 100 MG tablet Take 100 mg by mouth daily. 03/16/16  Yes Historical Provider, MD  LANTUS SOLOSTAR 100 UNIT/ML Solostar Pen Inject 35 Units into the skin at bedtime. 03/23/16  Yes Historical  Provider, MD  lisinopril-hydrochlorothiazide (PRINZIDE,ZESTORETIC) 20-12.5 MG per tablet Take 1 tablet by mouth daily.  04/10/12  Yes Historical Provider, MD  LYRICA 200 MG capsule Take 200-400 mg by mouth 2 (two) times daily. 200 mg in the morning and 400 mg at bedtime. 03/23/16  Yes Historical Provider, MD  metFORMIN (GLUCOPHAGE) 1000 MG tablet Take 1,000 mg by mouth.   Yes Historical Provider, MD  OLANZapine (ZYPREXA) 15 MG tablet Take 15 mg by mouth daily. 03/23/16  Yes Historical Provider, MD  ondansetron (ZOFRAN) 4 MG tablet Take 4 mg by mouth 2 (two) times daily as needed for nausea. 03/15/16  Yes Historical Provider, MD  pantoprazole (PROTONIX) 40 MG tablet Take 40 mg by mouth 2 (two) times daily.   Yes Historical Provider, MD  predniSONE (DELTASONE) 10 MG tablet Take 10-30 mg by mouth daily as needed (for chronic congestion/asthma).   Yes Historical Provider, MD  pregabalin (LYRICA) 150 MG capsule Take 150-300 mg by mouth 2 (two) times daily. 1 po qam and 2 po qhs 12/27/12  Yes Historical Provider, MD  SUBOXONE 8-2 MG FILM Take 1 Film by mouth 2 (two) times daily. 03/31/16  Yes Historical Provider, MD  tiZANidine (ZANAFLEX) 4 MG tablet Take 4 mg by mouth 3 (three) times daily as needed for spasms. 03/16/16  Yes Historical Provider, MD  traMADol (ULTRAM) 50 MG tablet Take 1 tablet (50 mg total) by mouth every 6 (six) hours as needed. 04/16/14  Yes Richard Ralene Cork, DPM  traZODone (DESYREL) 50 MG tablet Take 50-100 mg by mouth at bedtime as needed for sleep. 03/23/16  Yes Historical Provider, MD  VENTOLIN HFA 108 (90 Base) MCG/ACT inhaler Inhale 1-2 puffs into the lungs every 6 (six) hours as needed for shortness of breath. 01/16/16  Yes Historical Provider, MD       Physical Exam: BP 123/69   Pulse 113   Temp 97.8 F (36.6 C) (Oral)   Resp 14   Ht 5\' 8"  (1.727 m)   Wt 90.7 kg (200 lb)   SpO2 100%   BMI 30.41 kg/m  General appearance: Well-developed, obese adult female, alert and in mild  distress from recent overdose.   Eyes: Anicteric, conjunctiva pink, lids and lashes normal.     ENT: No nasal deformity, discharge, or epistaxis.  OP moist without lesions.   Lymph: No cervical or supraclavicular lymphadenopathy. Skin: Warm and dry.  No jaundice.  No suspicious rashes or lesions. Cardiac: Tachycardic, regular, nl S1-S2, no murmurs appreciated.  Capillary refill is brisk.  JVP not visible.  No LE edema.  Radial and DP pulses 2+ and symmetric. Respiratory: Normal respiratory rate and rhythm.  Bilateral, diffuse, inspiratory and tarry wheezing. GI: Abdomen soft without rigidity.  No TTP. No ascites, distension, hepatosplenomegaly.   MSK: No deformities or effusions.  No clubbing/cyanosis. Neuro: CRanial nerves normal.  Sensorium intact and responding to questions, attention normal.  Speech is fluent.  Moves all extremities equally and with normal coordination.    Psych: Affect tearful.  No evidence of hallucinations or paranoia.    Labs on Admission:  I have personally reviewed following labs and imaging studies: CBC:  Recent Labs Lab 04/22/16 2346 04/22/16 2354 04/22/16 2359  WBC 12.0*  --   --   NEUTROABS 8.2*  --   --   HGB 16.6* 12.9 17.3*  HCT 47.6* 38.0 51.0*  MCV 92.4  --   --   PLT 224  --   --    Basic Metabolic Panel:  Recent Labs Lab 04/22/16 2346 04/22/16 2350 04/22/16 2354 04/22/16 2359  NA 138  --  132* 138  K 3.6  --  2.5* 3.5  CL 99*  --  82* 97*  CO2 23  --   --   --   GLUCOSE 239*  --  168* 236*  BUN 10  --  21* 10  CREATININE 0.75  --  1.20* 1.00  CALCIUM 8.9  --   --   --   MG  --  2.0  --   --    GFR: Estimated Creatinine Clearance: 90.7 mL/min (by C-G formula based on SCr of 1 mg/dL).  Liver Function Tests:  Recent Labs Lab 04/22/16 2346  AST 60*  ALT 75*  ALKPHOS 93  BILITOT 0.7  PROT 8.3*  ALBUMIN 4.2   No results for input(s): LIPASE, AMYLASE in the last 168 hours. No results for input(s): AMMONIA in the last 168  hours. Coagulation Profile: No results for input(s): INR, PROTIME in the last 168 hours. Cardiac Enzymes: No results for input(s): CKTOTAL, CKMB, CKMBINDEX, TROPONINI in the last 168 hours. BNP (last 3 results) No results for input(s): PROBNP in the last 8760 hours. HbA1C: No results for input(s): HGBA1C in the last 72 hours. CBG: No results for input(s): GLUCAP in the last 168 hours. Lipid Profile: No results for input(s): CHOL, HDL, LDLCALC, TRIG, CHOLHDL, LDLDIRECT in the last 72 hours. Thyroid Function Tests: No results for input(s): TSH, T4TOTAL, FREET4, T3FREE, THYROIDAB in the last 72 hours. Anemia Panel: No results for input(s): VITAMINB12, FOLATE, FERRITIN, TIBC, IRON, RETICCTPCT in the last 72 hours. Sepsis Labs: Invalid input(s): PROCALCITONIN, LACTICIDVEN No results found for this or any previous visit (from the past 240 hour(s)).       Radiological Exams on Admission: Personally reviewed: Dg Chest Portable 1 View  Result Date: 04/22/2016 CLINICAL DATA:  Drug overdose. EXAM: PORTABLE CHEST 1 VIEW COMPARISON:  02/19/2016 FINDINGS: The heart size and mediastinal contours are within normal limits. Both lungs are clear. The visualized skeletal structures are unremarkable. IMPRESSION: No active disease. Electronically Signed   By: Myles Rosenthal M.D.   On: 04/22/2016 23:59    EKG: Independently reviewed. Rate 110, QTC 474, prominent T waves, no ischemic changes.    Assessment/Plan 1. Acute respiratory failure with hypoxia and hypercapnia from drug overdose:  Patient presented apneic from OD (heroin on top of Suboxone and olanzapine in patient with chronic asthma) with respiratory acidosis.  Did not tolerate BiPAP but now awake and respirations unlabored.   -Repeat ABG and restart BiPAP if persistently hypercapnic -Monitor on telemetry and continuous pulse oximetry -Consult to SW for drug treatment   2. Acute asthma/COPD exacerbation:  -Solumedrol 40 mg  BID -Albuterol/ipratropium scheduled -Albuterol PRN for wheezing   3. Transaminitis:  Likely from alcohol hepatitis, possibly NASH, as per history. -Repeat CMP -Check Hep C/HIV  4. Alcohol intoxication:  -IV fulids  5. IDDM:  -Continue Lantus -Hold metformin -Continue Lyrica -SSI with meals  6. HTN:  -Continue lisinopril-HCTZ  7. Bipolar and anxiety:  -Continue olanzapine -Continue Buspar  8. IVDU: -Resume subutex if more alert           DVT prophylaxis: Lovenox  Code Status: FULL  Family Communication: None present  Disposition Plan: Anticipate continued IV steroids, bronchodilators, monitor respiratory status.  Possibly home within 24 to 48 hours, possibly requiring longer IV steroids and then prolonged prednisone taper. Consults called: SW for drug treatment Admission status: OBS, tele    Medical decision making: Patient seen at 3:00 AM on 04/23/2016.  The patient was discussed with Dr. Nicanor Alcon. What exists of the patient's chart and outside recors in CareEverywhere were reviewed in depth.  Clinical condition: requiring .        Alberteen Sam [facility] Pager [phone]

## 2016-04-23 NOTE — Progress Notes (Signed)
Writer transferred pt to room 1233.

## 2016-04-23 NOTE — Progress Notes (Signed)
On call Montez MoritaCarter notified of ABG results at this time.

## 2016-04-23 NOTE — Progress Notes (Signed)
[facility] PLAN OF CARE NOTE Patient: Emily Larson ZOX:[id number]   PCP: LAND, PHILLIP, PA-C DOB: 09/02/1975   DOA: 04/22/2016   DOS: 04/23/2016    Patient was admitted by my colleague Dr.Danford earlier on 04/23/2016. I have reviewed the H&P as well as assessment and plan and agree with the same. Important changes in the plan are listed below.  Plan of care: Principal Problem:   Drug overdose Does not appear to be at risk for intubation at present. Talking in normal sentences and appears to be relatively comfortable. We'll closely monitor the patient in stepdown overnight.  Active Problems:   Asthma exacerbation   Acute respiratory failure with hypoxia and hypercapnia (HCC)  Continue steroids, continue nebulizers. ABG shows respiratory acidosis although patient appears to be relatively stable. BiPAP only as needed for distress. Can use Ventimask for titrated oxygen supply.    Alcohol intoxication (HCC) CIWA protocol per stepdown. Has some tremors and anxiety.   Author: Lynden OxfordPranav Grigor Lipschutz, MD [facility] Pager: [phone] 04/23/2016 8:55 AM   If 7PM-7AM, please contact night-coverage at [email or web address], password [email or web address]

## 2016-04-23 NOTE — Progress Notes (Signed)
Pt ripped BiPAP mask off stating that she is unable to tolerate the mask.  Pt refuses to go back on at this time.  Pt states she is willing to wear nasal cannula.  CAT going through regular face mask at this time.

## 2016-04-24 DIAGNOSIS — T401X1A Poisoning by heroin, accidental (unintentional), initial encounter: Secondary | ICD-10-CM | POA: Diagnosis not present

## 2016-04-24 LAB — CBC WITH DIFFERENTIAL/PLATELET
BASOS ABS: 0 10*3/uL (ref 0.0–0.1)
Basophils Relative: 0 %
EOS ABS: 0 10*3/uL (ref 0.0–0.7)
EOS PCT: 0 %
HCT: 42.5 % (ref 36.0–46.0)
Hemoglobin: 14.4 g/dL (ref 12.0–15.0)
Lymphocytes Relative: 4 %
Lymphs Abs: 0.6 10*3/uL — ABNORMAL LOW (ref 0.7–4.0)
MCH: 31.4 pg (ref 26.0–34.0)
MCHC: 33.9 g/dL (ref 30.0–36.0)
MCV: 92.6 fL (ref 78.0–100.0)
MONO ABS: 0.6 10*3/uL (ref 0.1–1.0)
Monocytes Relative: 3 %
Neutro Abs: 15.3 10*3/uL — ABNORMAL HIGH (ref 1.7–7.7)
Neutrophils Relative %: 93 %
PLATELETS: 153 10*3/uL (ref 150–400)
RBC: 4.59 MIL/uL (ref 3.87–5.11)
RDW: 13.8 % (ref 11.5–15.5)
WBC: 16.5 10*3/uL — AB (ref 4.0–10.5)

## 2016-04-24 LAB — BASIC METABOLIC PANEL
Anion gap: 9 (ref 5–15)
BUN: 19 mg/dL (ref 6–20)
CO2: 28 mmol/L (ref 22–32)
CREATININE: 0.79 mg/dL (ref 0.44–1.00)
Calcium: 9.4 mg/dL (ref 8.9–10.3)
Chloride: 98 mmol/L — ABNORMAL LOW (ref 101–111)
GFR calc Af Amer: >60 mL/min (ref >60)
GLUCOSE: 306 mg/dL — AB (ref 65–99)
Potassium: 4.8 mmol/L (ref 3.5–5.1)
SODIUM: 135 mmol/L (ref 135–145)

## 2016-04-24 LAB — HEPATITIS C ANTIBODY (REFLEX): HCV Ab: >11 s/co ratio — ABNORMAL HIGH (ref 0.0–0.9)

## 2016-04-24 LAB — GLUCOSE, CAPILLARY
GLUCOSE-CAPILLARY: 234 mg/dL — AB (ref 65–99)
GLUCOSE-CAPILLARY: 391 mg/dL — AB (ref 65–99)
Glucose-Capillary: 263 mg/dL — ABNORMAL HIGH (ref 65–99)
Glucose-Capillary: 441 mg/dL — ABNORMAL HIGH (ref 65–99)

## 2016-04-24 LAB — MAGNESIUM: Magnesium: 1.9 mg/dL (ref 1.7–2.4)

## 2016-04-24 LAB — COMMENT2 - HEP PANEL

## 2016-04-24 MED ORDER — FOLIC ACID 1 MG PO TABS
1.0000 mg | ORAL_TABLET | Freq: Every day | ORAL | Status: DC
  Administered 2016-04-25: 1 mg via ORAL
  Filled 2016-04-24: qty 1

## 2016-04-24 MED ORDER — TIZANIDINE HCL 4 MG PO TABS
4.0000 mg | ORAL_TABLET | Freq: Three times a day (TID) | ORAL | Status: DC | PRN
  Filled 2016-04-24: qty 1

## 2016-04-24 MED ORDER — LORAZEPAM 1 MG PO TABS
0.0000 mg | ORAL_TABLET | Freq: Four times a day (QID) | ORAL | Status: DC
  Administered 2016-04-24: 2 mg via ORAL
  Administered 2016-04-24: 1 mg via ORAL
  Administered 2016-04-25 (×2): 2 mg via ORAL
  Filled 2016-04-24: qty 2
  Filled 2016-04-24: qty 1
  Filled 2016-04-24 (×2): qty 2

## 2016-04-24 MED ORDER — GUAIFENESIN ER 600 MG PO TB12
1200.0000 mg | ORAL_TABLET | Freq: Two times a day (BID) | ORAL | Status: DC
  Administered 2016-04-24 – 2016-04-25 (×3): 1200 mg via ORAL
  Filled 2016-04-24 (×3): qty 2

## 2016-04-24 MED ORDER — INSULIN ASPART 100 UNIT/ML ~~LOC~~ SOLN
8.0000 [IU] | Freq: Three times a day (TID) | SUBCUTANEOUS | Status: DC
Start: 2016-04-25 — End: 2016-04-25
  Administered 2016-04-25 (×2): 8 [IU] via SUBCUTANEOUS

## 2016-04-24 MED ORDER — CLONIDINE HCL 0.1 MG PO TABS
0.1000 mg | ORAL_TABLET | Freq: Every day | ORAL | Status: DC
  Administered 2016-04-24: 0.1 mg via ORAL
  Filled 2016-04-24: qty 1

## 2016-04-24 MED ORDER — INSULIN ASPART 100 UNIT/ML ~~LOC~~ SOLN
8.0000 [IU] | Freq: Once | SUBCUTANEOUS | Status: AC
  Administered 2016-04-24: 8 [IU] via SUBCUTANEOUS

## 2016-04-24 MED ORDER — VITAMIN B-1 100 MG PO TABS
100.0000 mg | ORAL_TABLET | Freq: Every day | ORAL | Status: DC
  Administered 2016-04-25: 100 mg via ORAL
  Filled 2016-04-24: qty 1

## 2016-04-24 MED ORDER — LORAZEPAM 1 MG PO TABS: 1.0000 mg | ORAL_TABLET | Freq: Four times a day (QID) | ORAL | Status: DC | PRN

## 2016-04-24 MED ORDER — INSULIN GLARGINE 100 UNIT/ML ~~LOC~~ SOLN
8.0000 [IU] | Freq: Once | SUBCUTANEOUS | Status: AC
  Administered 2016-04-24: 8 [IU] via SUBCUTANEOUS
  Filled 2016-04-24: qty 0.08

## 2016-04-24 MED ORDER — ADULT MULTIVITAMIN W/MINERALS CH
1.0000 | ORAL_TABLET | Freq: Every day | ORAL | Status: DC
  Administered 2016-04-24 – 2016-04-25 (×2): 1 via ORAL
  Filled 2016-04-24 (×2): qty 1

## 2016-04-24 MED ORDER — CLONIDINE HCL 0.1 MG PO TABS: 0.1000 mg | ORAL_TABLET | Freq: Two times a day (BID) | ORAL | Status: DC

## 2016-04-24 MED ORDER — LORAZEPAM 2 MG/ML IJ SOLN: 1.0000 mg | Freq: Four times a day (QID) | INTRAMUSCULAR | Status: DC | PRN

## 2016-04-24 MED ORDER — LORAZEPAM 1 MG PO TABS: 0.0000 mg | ORAL_TABLET | Freq: Two times a day (BID) | ORAL | Status: DC

## 2016-04-24 MED ORDER — CLONIDINE HCL 0.1 MG PO TABS
0.1000 mg | ORAL_TABLET | Freq: Three times a day (TID) | ORAL | Status: DC
  Administered 2016-04-24 – 2016-04-25 (×4): 0.1 mg via ORAL
  Filled 2016-04-24 (×4): qty 1

## 2016-04-24 MED ORDER — METHYLPREDNISOLONE SODIUM SUCC 40 MG IJ SOLR
40.0000 mg | Freq: Every day | INTRAMUSCULAR | Status: DC
  Administered 2016-04-24 – 2016-04-25 (×2): 40 mg via INTRAVENOUS
  Filled 2016-04-24 (×2): qty 1

## 2016-04-24 MED ORDER — THIAMINE HCL 100 MG/ML IJ SOLN: 100.0000 mg | Freq: Every day | INTRAMUSCULAR | Status: DC

## 2016-04-24 MED ORDER — HYDRALAZINE HCL 20 MG/ML IJ SOLN: 10.0000 mg | INTRAMUSCULAR | Status: DC | PRN

## 2016-04-24 MED ORDER — TRAZODONE HCL 50 MG PO TABS: 50.0000 mg | ORAL_TABLET | Freq: Every evening | ORAL | Status: DC | PRN

## 2016-04-24 MED ORDER — TRAMADOL HCL 50 MG PO TABS
50.0000 mg | ORAL_TABLET | Freq: Four times a day (QID) | ORAL | Status: DC | PRN
  Administered 2016-04-25: 50 mg via ORAL
  Filled 2016-04-24: qty 1

## 2016-04-24 MED ORDER — INSULIN GLARGINE 100 UNIT/ML ~~LOC~~ SOLN
50.0000 [IU] | Freq: Every day | SUBCUTANEOUS | Status: DC
Start: 2016-04-24 — End: 2016-04-25
  Administered 2016-04-24: 50 [IU] via SUBCUTANEOUS
  Filled 2016-04-24 (×2): qty 0.5

## 2016-04-24 MED ORDER — LEVOFLOXACIN 750 MG PO TABS
750.0000 mg | ORAL_TABLET | ORAL | Status: DC
  Administered 2016-04-24 – 2016-04-25 (×2): 750 mg via ORAL
  Filled 2016-04-24 (×2): qty 1

## 2016-04-24 NOTE — Progress Notes (Signed)
Pharmacy Antibiotic Note  Emily Larson is a 40 y.o. female admitted on 04/22/2016 with heroin overdose and apparent asthma exacerbation. On 04/24/2016 pharmacy is consulted to add oral Levaquin for suspected CAP. Afebrile. Renal function is good.  Plan: Start Levaquin 750mg  PO q24h. Dose adjustments are not likely to be needed. Pharmacy will sign-off.   Height: 5\' 8"  (172.7 cm) Weight: 199 lb 1.2 oz (90.3 kg) IBW/kg (Calculated) : 63.9  Temp (24hrs), Avg:98.2 F (36.8 C), Min:97.6 F (36.4 C), Max:98.9 F (37.2 C)   Recent Labs Lab 04/22/16 2346 04/22/16 2354 04/22/16 2359 04/23/16 0605 04/24/16 0359  WBC 12.0*  --   --  18.4* 16.5*  CREATININE 0.75 1.20* 1.00 0.92 0.79    Estimated Creatinine Clearance: 113.2 mL/min (by C-G formula based on SCr of 0.8 mg/dL).    Allergies  Allergen Reactions  . Keflex [Cephalexin] Itching and Rash  . Penicillins Rash    Has patient had a PCN reaction causing immediate rash, facial/tongue/throat swelling, SOB or lightheadedness with hypotension:Yes Has patient had a PCN reaction causing severe rash involving mucus membranes or skin necrosis:no Has patient had a PCN reaction that required hospitalization:unsure Has patient had a PCN reaction occurring within the last 10 years:No If all of the above answers are "NO", then may proceed with Cephalosporin use.      Thank you for allowing pharmacy to be a part of this patient's care.  Charolotte Ekeom Notnamed Scholz, PharmD, pager [phone]. 04/24/2016,3:15 PM.

## 2016-04-24 NOTE — Progress Notes (Addendum)
[facility] Progress Note  Patient: Emily Larson [id number]:[id number]   PCP: LAND, PHILLIP, PA-C DOB: [birth year]   DOA: 04/22/2016   DOS: 04/24/2016   Date of Service: the patient was seen and examined on 04/24/2016  Subjective: The patient mentions her breathing is better for her. She has dry cough. Denies any abdominal painOr nausea or vomiting.  Complains about  Chest soreness from the CPR that she received. Required 1 dose of Ativan for Ciwa. Nutrition: Tolerating oral diet  Brief hospital course: Pt. with PMH of Asthma, drug abuse, active smoker; admitted on 04/22/2016, with complaint of passing out episode followed by CPR Although there was no loss of pulse, was found to have Multidrug Withdrawal as well as asthma exacerbation. Currently further plan is Continue treatment for asthma extubation.  Assessment and Plan: 1. Drug overdose Patient  UDS is positive for heroin as well as cocaine. Denies any suicidal ideation. Currently has tachycardia as well as tachypnea as well as anxiety and perspiration. Continue clonidine which will help in both tachycardia as well as hypertension as well as withdrawal symptoms.  2.Asthma extubation. Patient has bilateral expiratory wheezing on examination. Currently on room air both at rest as well as on ambulation. Has significant tachycardia with heart rate in 110s and tachypnea with respiratory rate in 28's. With this I do not feel comfortable discharging patient home and therefore will continue breathing treatment and monitor on telemetry. Add Mucinex, add  Levaquin  3.Active smoker. Patient counseled against smoking. Continue BuSpar, continue nicotine patch.  4. Alcohol abuse. Patient with transition out of the stepdown unit since she is not requiring significant amount of Ativan by RN. Transitioning to MedSurg CIWA protocol.  5. Steroid-induced hyperglycemia. Type 2 diabetes mellitus. On Januvia as well as Lantus at home. Patient has been eating  Twix and M&M, hemoglobin A1c Currently increasing Lantus dose due to hyperglycemia. Continue resistant sliding scale insulin.  6.Essential hypertension.  blood pressure mildly elevated. Continue hydrochlorothiazide, continue clonidine twice a day. Avoid beta blockers given the setting of cocaine.  7.Hepatitis C. Patient appears to have hepatitis C infection evidence. HIV is negative. Multiple family members were in the room and therefore I did not discuss this results with patient's. Will discussed with the next opportunity.  Pain management: when necessary Tylenol Activity: no indication for physical therapy Bowel regimen: last BM 04/22/2016 Diet: Carb modified diet DVT Prophylaxis: subcutaneous Heparin  Advance goals of care discussion: Full code  Family Communication: family was present at bedside, at the time of interview. The pt provided permission to discuss medical plan with the family. Opportunity was given to ask question and all questions were answered satisfactorily.   Disposition:  Discharge to Home. Expected discharge date: 04/25/2016, Pending improvement in tachycardia as well as tachypnea  Consultants: none Procedures: None  Antibiotics: Anti-infectives    Start     Dose/Rate Route Frequency Ordered Stop   04/24/16 1600  levofloxacin (LEVAQUIN) tablet 750 mg     750 mg Oral Every 24 hours 04/24/16 1516          Intake/Output Summary (Last 24 hours) at 04/24/16 1533 Last data filed at 04/24/16 1343  Gross per 24 hour  Intake              360 ml  Output             3100 ml  Net            -2740 ml   [employer]  04/22/16 2328 04/23/16 0426 04/23/16 0925  Weight: 90.7 kg (200 lb) 90.5 kg (199 lb 8.3 oz) 90.3 kg (199 lb 1.2 oz)    Objective: Physical Exam: Vitals:   04/24/16 1200 04/24/16 1341 04/24/16 1415 04/24/16 1416  BP:  (!) 150/86    Pulse:  (!) 103 (!) 107 (!) 105  Resp:  (!) 25 (!) 26 (!) 24  Temp: 98.9 F (37.2 C)     TempSrc:  Axillary     SpO2:  92% 92% 93%  Weight:      Height:        General: Alert, Awake and Oriented to Time, Place and Person. Appear in moderate distress Eyes: PERRL, Conjunctiva normal ENT: Oral Mucosa clear moist. Neck: difficult to assess JVD, no Abnormal Mass Or lumps Cardiovascular: S1 and S2 Present, no Murmur, Respiratory: Bilateral Air entry equal and Decreased, basal Crackles, bilateral wheezes Abdomen: Bowel Sound present, Soft and no tenderness Skin: no redness, no Rash  Extremities: trace Pedal edema, no calf tenderness Neurologic: Grossly no focal neuro deficit. Bilaterally Equal motor strength  Data Reviewed: CBC:  Recent Labs Lab 04/22/16 2346 04/22/16 2354 04/22/16 2359 04/23/16 0605 04/24/16 0359  WBC 12.0*  --   --  18.4* 16.5*  NEUTROABS 8.2*  --   --   --  15.3*  HGB 16.6* 12.9 17.3* 14.9 14.4  HCT 47.6* 38.0 51.0* 44.5 42.5  MCV 92.4  --   --  94.5 92.6  PLT 224  --   --  180 153   Basic Metabolic Panel:  Recent Labs Lab 04/22/16 2346 04/22/16 2350 04/22/16 2354 04/22/16 2359 04/23/16 0605 04/23/16 1257 04/24/16 0359  NA 138  --  132* 138 137  --  135  K 3.6  --  2.5* 3.5 4.6  --  4.8  CL 99*  --  82* 97* 101  --  98*  CO2 23  --   --   --  23  --  28  GLUCOSE 239*  --  168* 236* 296* 373* 306*  BUN 10  --  21* 10 13  --  19  CREATININE 0.75  --  1.20* 1.00 0.92  --  0.79  CALCIUM 8.9  --   --   --  8.3*  --  9.4  MG  --  2.0  --   --   --   --  1.9    Liver Function Tests:  Recent Labs Lab 04/22/16 2346 04/23/16 0605  AST 60* 46*  ALT 75* 70*  ALKPHOS 93 81  BILITOT 0.7 0.5  PROT 8.3* 7.7  ALBUMIN 4.2 4.2   No results for input(s): LIPASE, AMYLASE in the last 168 hours. No results for input(s): AMMONIA in the last 168 hours. Coagulation Profile: No results for input(s): INR, PROTIME in the last 168 hours. Cardiac Enzymes: No results for input(s): CKTOTAL, CKMB, CKMBINDEX, TROPONINI in the last 168 hours. BNP (last 3  results) No results for input(s): PROBNP in the last 8760 hours.  CBG:  Recent Labs Lab 04/23/16 1219 04/23/16 1649 04/23/16 2104 04/24/16 0721 04/24/16 1147  GLUCAP 424* 320* 304* 263* 234*    Studies: No results found.   Scheduled Meds: . atorvastatin  40 mg Oral QPM  . busPIRone  15 mg Oral TID  . cloNIDine  0.1 mg Oral BID  . enoxaparin (LOVENOX) injection  40 mg Subcutaneous Q24H  . folic acid  1 mg Oral Daily  . guaiFENesin  1,200  mg Oral BID  . insulin aspart  0-20 Units Subcutaneous TID WC  . insulin aspart  0-5 Units Subcutaneous QHS  . insulin glargine  50 Units Subcutaneous QHS  . ipratropium-albuterol  3 mL Nebulization QID  . levofloxacin  750 mg Oral Q24H  . mouth rinse  15 mL Mouth Rinse BID  . methylPREDNISolone (SOLU-MEDROL) injection  40 mg Intravenous Daily  . nicotine  21 mg Transdermal Daily  . OLANZapine  15 mg Oral Daily  . pantoprazole  40 mg Oral BID  . pregabalin  200 mg Oral Daily  . pregabalin  400 mg Oral QHS  . sodium chloride flush  3 mL Intravenous Q12H  . thiamine  100 mg Oral Daily   Continuous Infusions:  PRN Meds: acetaminophen **OR** acetaminophen, albuterol, albuterol, LORazepam, ondansetron **OR** ondansetron (ZOFRAN) IV  Time spent: 30 minutes  Author: Lynden Oxford, MD [facility] Pager: [phone] 04/24/2016 3:33 PM  If 7PM-7AM, please contact night-coverage at [email or web address], password [facility]

## 2016-04-24 NOTE — Progress Notes (Signed)
Received from SDU, alert and oriented. Agree with previous  RN's assessment. 

## 2016-04-24 NOTE — Progress Notes (Signed)
SATURATION QUALIFICATIONS: (This note is used to comply with regulatory documentation for home oxygen)  Patient Saturations on Room Air at Rest = 93%  Patient Saturations on Room Air while Ambulating = 90%  Patient Saturations on  Liters of oxygen while Ambulating = %  Please briefly explain why patient needs home oxygen: 

## 2016-04-25 ENCOUNTER — Inpatient Hospital Stay (HOSPITAL_COMMUNITY): Payer: Medicare HMO

## 2016-04-25 LAB — BLOOD GAS, ARTERIAL
Acid-base deficit: 1 mmol/L (ref 0.0–2.0)
BICARBONATE: 25.1 mmol/L (ref 20.0–28.0)
DRAWN BY: 27605
Delivery systems: POSITIVE
Expiratory PAP: 5
FIO2: 40
Inspiratory PAP: 10
O2 SAT: 97.2 %
PATIENT TEMPERATURE: 98.6
PCO2 ART: 49.4 mmHg — AB (ref 32.0–48.0)
PO2 ART: 95.9 mmHg (ref 83.0–108.0)
RATE: 8 resp/min
pH, Arterial: 7.326 — ABNORMAL LOW (ref 7.350–7.450)

## 2016-04-25 LAB — COMPREHENSIVE METABOLIC PANEL
ALT: 36 U/L (ref 14–54)
ANION GAP: 7 (ref 5–15)
AST: 16 U/L (ref 15–41)
Albumin: 3.7 g/dL (ref 3.5–5.0)
Alkaline Phosphatase: 71 U/L (ref 38–126)
BUN: 19 mg/dL (ref 6–20)
CHLORIDE: 104 mmol/L (ref 101–111)
CO2: 30 mmol/L (ref 22–32)
Calcium: 9.5 mg/dL (ref 8.9–10.3)
Creatinine, Ser: 0.87 mg/dL (ref 0.44–1.00)
GFR calc non Af Amer: >60 mL/min (ref >60)
Glucose, Bld: 208 mg/dL — ABNORMAL HIGH (ref 65–99)
POTASSIUM: 3.7 mmol/L (ref 3.5–5.1)
SODIUM: 141 mmol/L (ref 135–145)
Total Bilirubin: 1 mg/dL (ref 0.3–1.2)
Total Protein: 7.5 g/dL (ref 6.5–8.1)

## 2016-04-25 LAB — CBC WITH DIFFERENTIAL/PLATELET
BASOS PCT: 0 %
Basophils Absolute: 0 10*3/uL (ref 0.0–0.1)
Eosinophils Absolute: 0 10*3/uL (ref 0.0–0.7)
Eosinophils Relative: 0 %
HCT: 45.5 % (ref 36.0–46.0)
HEMOGLOBIN: 15 g/dL (ref 12.0–15.0)
LYMPHS ABS: 2.6 10*3/uL (ref 0.7–4.0)
Lymphocytes Relative: 20 %
MCH: 30.9 pg (ref 26.0–34.0)
MCHC: 33 g/dL (ref 30.0–36.0)
MCV: 93.8 fL (ref 78.0–100.0)
MONOS PCT: 5 %
Monocytes Absolute: 0.7 10*3/uL (ref 0.1–1.0)
NEUTROS ABS: 9.9 10*3/uL — AB (ref 1.7–7.7)
NEUTROS PCT: 75 %
Platelets: 159 10*3/uL (ref 150–400)
RBC: 4.85 MIL/uL (ref 3.87–5.11)
RDW: 13.8 % (ref 11.5–15.5)
WBC: 13.3 10*3/uL — ABNORMAL HIGH (ref 4.0–10.5)

## 2016-04-25 LAB — MAGNESIUM: MAGNESIUM: 1.9 mg/dL (ref 1.7–2.4)

## 2016-04-25 LAB — GLUCOSE, CAPILLARY
GLUCOSE-CAPILLARY: 191 mg/dL — AB (ref 65–99)
GLUCOSE-CAPILLARY: 140 mg/dL — AB (ref 65–99)

## 2016-04-25 MED ORDER — LEVOFLOXACIN 750 MG PO TABS: 750.0000 mg | ORAL_TABLET | ORAL | 0 refills | Status: AC

## 2016-04-25 MED ORDER — FOLIC ACID 1 MG PO TABS: 1.0000 mg | ORAL_TABLET | Freq: Every day | ORAL | 0 refills | Status: DC

## 2016-04-25 MED ORDER — IPRATROPIUM-ALBUTEROL 0.5-2.5 (3) MG/3ML IN SOLN: 3.0000 mL | Freq: Three times a day (TID) | RESPIRATORY_TRACT | 0 refills | Status: DC

## 2016-04-25 MED ORDER — THIAMINE HCL 100 MG PO TABS: 100.0000 mg | ORAL_TABLET | Freq: Every day | ORAL | 0 refills | Status: DC

## 2016-04-25 MED ORDER — NICOTINE 21 MG/24HR TD PT24: 21.0000 mg | MEDICATED_PATCH | Freq: Every day | TRANSDERMAL | 0 refills | Status: DC

## 2016-04-25 MED ORDER — GUAIFENESIN ER 600 MG PO TB12
600.0000 mg | ORAL_TABLET | Freq: Two times a day (BID) | ORAL | 0 refills | Status: DC
Start: 2016-04-25 — End: 2021-03-30

## 2016-04-25 MED ORDER — BUTALBITAL-APAP-CAFFEINE 50-325-40 MG PO TABS
1.0000 | ORAL_TABLET | ORAL | Status: DC | PRN
  Administered 2016-04-25: 1 via ORAL
  Filled 2016-04-25: qty 1

## 2016-04-25 MED ORDER — IPRATROPIUM-ALBUTEROL 0.5-2.5 (3) MG/3ML IN SOLN: 3.0000 mL | Freq: Three times a day (TID) | RESPIRATORY_TRACT | Status: DC

## 2016-04-25 MED ORDER — PREDNISONE 10 MG PO TABS: ORAL_TABLET | ORAL | 0 refills | Status: DC

## 2016-04-25 MED ORDER — CLONIDINE HCL 0.1 MG PO TABS: 0.1000 mg | ORAL_TABLET | Freq: Every day | ORAL | 11 refills | Status: DC

## 2016-04-25 NOTE — Progress Notes (Signed)
SATURATION QUALIFICATIONS: (This note is used to comply with regulatory documentation for home oxygen)  Patient Saturations on Room Air at Rest = 96%  Patient Saturations on Room Air while Ambulating = 95%  

## 2016-04-25 NOTE — Discharge Summary (Signed)
[facility] Discharge Summary   Patient: Emily Larson ZOX:[id number]   PCP: LAND, PHILLIP, PA-C DOB: 10-18-1975   Date of admission: 04/22/2016   Date of discharge: 04/25/2016     Discharge Diagnoses:  Principal Problem:   Drug overdose Active Problems:   Asthma exacerbation   Acute respiratory failure with hypoxia and hypercapnia (HCC)   IVDU (intravenous drug user)   Transaminitis   Alcohol intoxication (HCC)   Overdose   Admitted From: home Disposition:  home  Recommendations for Outpatient Follow-up:  1. Please follow up with PCP in 1 week    Diet recommendation: carb modified diet  Activity: The patient is advised to gradually reintroduce usual activities.  Discharge Condition: good  Code Status: full code  History of present illness: As per the H and P dictated on admission, "Emily Larson is a 40 y.o. female with a past medical history significant for severe persistent asthma/COPD followed by Pulmonology in [place], last prednisone ~3-4 months ago, former IVDU and endocarditis in 2015, IDDM, HTN, and nighttime hypoxia without OSA who presents with heroin overdose.  Per report, the patient was at home with her husband tonight, snorted heroin (only the second time in a long time), used cocaine, and then evidently collapsed or stopped breathing and EMS were called.  Narcan was administered in the field with some improvement and the patient was also given albuterol because of wheezing.    ED course: -Afebrile, heart rate 100s, respirations 26, requiring supplemental oxygen to maintain O2 sat greater than 92%. -Na 138, K 3.6, Cr 0.7 (baseline), WBC 12 K, Hgb 16, AST and ALT 60 and 75 -UDS positive for opiates and cocaine -Alcohol 235 mm grams per deciliter -Acetaminophen and salicylates negative -Troponin negative -CXR clear -ABG showed pH 7.24, PCO2 58, PO2 59 -She was given Solu-Medrol and albuterol, with some improvement in her clinical status -She was placed on  BiPAP but removed this and was more alert and protecting her airway and saturating well O2 by nasal cannula -TRH were asked to evaluate for admission  She describes daily use of albuterol, frequent prednisone use for her lung disease (she describes this as both asthma and COPD, is followed by Dr. Delbert Phenix in [place]).  She is not forthcoming with her previous IVDU and endocarditis."  Hospital Course:  Summary of her active problems in the hospital is as following. 1. Drug overdose Patient  UDS is positive for opiates as well as cocaine. Pt mentions she OD on heroin. Denies any suicidal ideation. Continue clonidine which will help in both tachycardia as well as hypertension as well as withdrawal symptoms.  2.Asthma exacerbation. Patient had bilateral expiratory wheezing on examination. Currently on room air both at rest as well as on ambulation. Add Mucinex, finish Levaquin with prednisone taper Pt mentions she has nebulizer as well medication at home. I will prescribe her duoneb for 1 week.  3.Active smoker. Patient counseled against smoking. Continue BuSpar, continue nicotine patch.  4. Alcohol abuse. Mild withdrawal here. Pt denies any intention to quit.  5. Steroid-induced hyperglycemia. Type 2 diabetes mellitus. On Januvia as well as Lantus at home.  6.Essential hypertension.  blood pressure mildly elevated. Continue hydrochlorothiazide, continue clonidine. Avoid beta blockers given the setting of cocaine.  7.Hepatitis C. Patient appears to have hepatitis C infection evidence. HIV is negative. D/w pt and mentioned her to get referral to ID.  All other chronic medical condition were stable during the hospitalization.  Patient was ambulatory without any assistance. On the  day of the discharge the patient's vitals were stable, and no other acute medical condition were reported by patient. the patient was felt safe to be discharge at home with family.  Procedures  and Results:  none   Consultations:  none  DISCHARGE MEDICATION: Discharge Medication List as of 04/25/2016  4:35 PM    START taking these medications   Details  cloNIDine (CATAPRES) 0.1 MG tablet Take 1 tablet (0.1 mg total) by mouth daily., Starting Tue 04/25/2016, Normal    folic acid (FOLVITE) 1 MG tablet Take 1 tablet (1 mg total) by mouth daily., Starting Tue 04/25/2016, Normal    guaiFENesin (MUCINEX) 600 MG 12 hr tablet Take 1 tablet (600 mg total) by mouth 2 (two) times daily., Starting Tue 04/25/2016, Normal    ipratropium-albuterol (DUONEB) 0.5-2.5 (3) MG/3ML SOLN Take 3 mLs by nebulization 3 (three) times daily., Starting Tue 04/25/2016, Normal    levofloxacin (LEVAQUIN) 750 MG tablet Take 1 tablet (750 mg total) by mouth daily., Starting Tue 04/25/2016, Until Fri 04/28/2016, Normal    nicotine (NICODERM CQ - DOSED IN MG/24 HOURS) 21 mg/24hr patch Place 1 patch (21 mg total) onto the skin daily., Starting Tue 04/25/2016, Normal    thiamine 100 MG tablet Take 1 tablet (100 mg total) by mouth daily., Starting Tue 04/25/2016, Normal      CONTINUE these medications which have CHANGED   Details  predniSONE (DELTASONE) 10 MG tablet Take 50mg  daily for 3days,Take 40mg  daily for 3days,Take 30mg  daily for 3days,Take 20mg  daily for 3days,Take 10mg  daily for 3days, then stop., Print      CONTINUE these medications which have NOT CHANGED   Details  atorvastatin (LIPITOR) 40 MG tablet Take 40 mg by mouth every evening., Historical Med    busPIRone (BUSPAR) 15 MG tablet Take 15 mg by mouth 3 (three) times daily., Starting Wed 04/12/2016, Historical Med    JANUVIA 100 MG tablet Take 100 mg by mouth daily., Starting Thu 03/16/2016, Historical Med    LANTUS SOLOSTAR 100 UNIT/ML Solostar Pen Inject 35 Units into the skin at bedtime., Starting Thu 03/23/2016, Historical Med    lisinopril-hydrochlorothiazide (PRINZIDE,ZESTORETIC) 20-12.5 MG per tablet Take 1 tablet by mouth daily. , Starting Wed  04/10/2012, Historical Med    LYRICA 200 MG capsule Take 200-400 mg by mouth 2 (two) times daily. 200 mg in the morning and 400 mg at bedtime., Starting Thu 03/23/2016, Historical Med    metFORMIN (GLUCOPHAGE) 1000 MG tablet Take 1,000 mg by mouth., Historical Med    OLANZapine (ZYPREXA) 15 MG tablet Take 15 mg by mouth daily., Starting Thu 03/23/2016, Historical Med    ondansetron (ZOFRAN) 4 MG tablet Take 4 mg by mouth 2 (two) times daily as needed for nausea., Starting Wed 03/15/2016, Historical Med    pantoprazole (PROTONIX) 40 MG tablet Take 40 mg by mouth 2 (two) times daily., Historical Med    SUBOXONE 8-2 MG FILM Take 1 Film by mouth 2 (two) times daily., Starting Fri 03/31/2016, Historical Med    VENTOLIN HFA 108 (90 Base) MCG/ACT inhaler Inhale 1-2 puffs into the lungs every 6 (six) hours as needed for shortness of breath., Starting Sun 01/16/2016, Historical Med      STOP taking these medications     tiZANidine (ZANAFLEX) 4 MG tablet      traMADol (ULTRAM) 50 MG tablet      traZODone (DESYREL) 50 MG tablet        Allergies  Allergen Reactions  . Keflex [Cephalexin] Itching  and Rash  . Penicillins Rash    Has patient had a PCN reaction causing immediate rash, facial/tongue/throat swelling, SOB or lightheadedness with hypotension:Yes Has patient had a PCN reaction causing severe rash involving mucus membranes or skin necrosis:no Has patient had a PCN reaction that required hospitalization:unsure Has patient had a PCN reaction occurring within the last 10 years:No If all of the above answers are "NO", then may proceed with Cephalosporin use.    Discharge Instructions    Diet - low sodium heart healthy    Complete by:  As directed   Diet Carb Modified    Complete by:  As directed   Discharge instructions    Complete by:  As directed   It is important that you read following instructions as well as go over your medication list with RN to help you understand your care after  this hospitalization.  Discharge Instructions: Please follow-up with PCP in one week  Please request your primary care physician to go over all Hospital Tests and Procedure/Radiological results at the follow up,  Please get all Hospital records sent to your PCP by signing hospital release before you go home.   Do not drive, operating heavy machinery, perform activities at heights, swimming or participation in water activities or provide baby sitting services; until you have been seen by Primary Care Physician or a Neurologist and advised to do so again. Do not take more than prescribed Pain, Sleep and Anxiety Medications. You were cared for by a hospitalist during your hospital stay. If you have any questions about your discharge medications or the care you received while you were in the hospital after you are discharged, you can call the unit and ask to speak with the hospitalist on call if the hospitalist that took care of you is not available.  Once you are discharged, your primary care physician will handle any further medical issues. Please note that NO REFILLS for any discharge medications will be authorized once you are discharged, as it is imperative that you return to your primary care physician (or establish a relationship with a primary care physician if you do not have one) for your aftercare needs so that they can reassess your need for medications and monitor your lab values. You Must read complete instructions/literature along with all the possible adverse reactions/side effects for all the Medicines you take and that have been prescribed to you. Take any new Medicines after you have completely understood and accept all the possible adverse reactions/side effects. Wear Seat belts while driving. If you have smoked or chewed Tobacco in the last 2 yrs please stop smoking and/or stop any Recreational drug use.   Increase activity slowly    Complete by:  As directed     Discharge  Exam: Filed Weights   04/22/16 2328 04/23/16 0426 04/23/16 0925  Weight: 90.7 kg (200 lb) 90.5 kg (199 lb 8.3 oz) 90.3 kg (199 lb 1.2 oz)   Vitals:   04/25/16 1214 04/25/16 1300  BP:  126/73  Pulse: (!) 103 (!) 103  Resp: 18 18  Temp:  99 F (37.2 C)   General: Appear in mild distress, no Rash; Oral Mucosa moist. Cardiovascular: S1 and S2 Present, aortic systolic Murmur, no JVD Respiratory: Bilateral Air entry present and Clear to Auscultation, no Crackles, no wheezes Abdomen: Bowel Sound present, Soft and no tenderness Extremities: no Pedal edema, no calf tenderness Neurology: Grossly no focal neuro deficit.  The results of significant diagnostics from this hospitalization (  including imaging, microbiology, ancillary and laboratory) are listed below for reference.    Significant Diagnostic Studies: Dg Chest 2 View  Result Date: 04/25/2016 CLINICAL DATA:  40 year old female with cough, congestion, shortness of breath, fever and asthma. Subsequent encounter. EXAM: CHEST  2 VIEW COMPARISON:  Several prior exams, most recent 04/22/2016 and 02/19/2016. FINDINGS: Change since recent examination. This may represent asthma exacerbation with areas of subsegmental atelectasis (most notable left perihilar region and medial right lung base) and superimposed pulmonary vascular congestion. Given the patient's history of fever, subtle infectious infiltrate cannot be entirely excluded. No pneumothorax. Heart size top-normal. IMPRESSION: Change since recent examination. This may represent asthma exacerbation with areas of subsegmental atelectasis and superimposed pulmonary vascular congestion. Given the patient's history of fever, subtle infectious infiltrate cannot be entirely excluded. Electronically Signed   By: Lacy Duverney M.D.   On: 04/25/2016 10:43   Dg Chest Portable 1 View  Result Date: 04/22/2016 CLINICAL DATA:  Drug overdose. EXAM: PORTABLE CHEST 1 VIEW COMPARISON:  02/19/2016 FINDINGS: The  heart size and mediastinal contours are within normal limits. Both lungs are clear. The visualized skeletal structures are unremarkable. IMPRESSION: No active disease. Electronically Signed   By: Myles Rosenthal M.D.   On: 04/22/2016 23:59    Microbiology: Recent Results (from the past 240 hour(s))  MRSA PCR Screening     Status: None   Collection Time: 04/23/16  9:26 AM  Result Value Ref Range Status   MRSA by PCR NEGATIVE NEGATIVE Final    Comment:        The GeneXpert MRSA Assay (FDA approved for NASAL specimens only), is one component of a comprehensive MRSA colonization surveillance program. It is not intended to diagnose MRSA infection nor to guide or monitor treatment for MRSA infections.      Labs: CBC:  Recent Labs Lab 04/22/16 2346 04/22/16 2354 04/22/16 2359 04/23/16 0605 04/24/16 0359 04/25/16 0517  WBC 12.0*  --   --  18.4* 16.5* 13.3*  NEUTROABS 8.2*  --   --   --  15.3* 9.9*  HGB 16.6* 12.9 17.3* 14.9 14.4 15.0  HCT 47.6* 38.0 51.0* 44.5 42.5 45.5  MCV 92.4  --   --  94.5 92.6 93.8  PLT 224  --   --  180 153 159   Basic Metabolic Panel:  Recent Labs Lab 04/22/16 2346 04/22/16 2350 04/22/16 2354 04/22/16 2359 04/23/16 0605 04/23/16 1257 04/24/16 0359 04/25/16 0517  NA 138  --  132* 138 137  --  135 141  K 3.6  --  2.5* 3.5 4.6  --  4.8 3.7  CL 99*  --  82* 97* 101  --  98* 104  CO2 23  --   --   --  23  --  28 30  GLUCOSE 239*  --  168* 236* 296* 373* 306* 208*  BUN 10  --  21* 10 13  --  19 19  CREATININE 0.75  --  1.20* 1.00 0.92  --  0.79 0.87  CALCIUM 8.9  --   --   --  8.3*  --  9.4 9.5  MG  --  2.0  --   --   --   --  1.9 1.9   Liver Function Tests:  Recent Labs Lab 04/22/16 2346 04/23/16 0605 04/25/16 0517  AST 60* 46* 16  ALT 75* 70* 36  ALKPHOS 93 81 71  BILITOT 0.7 0.5 1.0  PROT 8.3* 7.7 7.5  ALBUMIN  4.2 4.2 3.7   CBG:  Recent Labs Lab 04/24/16 1147 04/24/16 1707 04/24/16 2149 04/25/16 0757 04/25/16 1154  GLUCAP  234* 441* 391* 191* 140*   Time spent: 30 minutes  Signed:  Jesenia Spera  [facility] 04/25/2016   , 10:53 PM

## 2016-04-26 LAB — HCV RNA QUANT
HCV QUANT: 8440000 [IU]/mL (ref >50)
HCV Quantitative Log: 6.926 log10 IU/mL (ref >1.70)

## 2016-04-26 LAB — HEMOGLOBIN A1C
HEMOGLOBIN A1C: 9 % — AB (ref 4.8–5.6)
Mean Plasma Glucose: 212 mg/dL

## 2016-05-05 DIAGNOSIS — B182 Chronic viral hepatitis C: Secondary | ICD-10-CM

## 2016-05-05 DIAGNOSIS — I340 Nonrheumatic mitral (valve) insufficiency: Secondary | ICD-10-CM

## 2016-05-05 HISTORY — DX: Nonrheumatic mitral (valve) insufficiency: I34.0

## 2016-05-05 HISTORY — DX: Chronic viral hepatitis C: B18.2

## 2016-06-23 DIAGNOSIS — R1013 Epigastric pain: Secondary | ICD-10-CM | POA: Diagnosis not present

## 2017-06-19 IMAGING — DX DG CHEST 2V
2 series · 2 of 2 positions shown · non-contrast
Comparison: Several prior exams, most recent 04/22/2016 and
02/19/2016.

CLINICAL DATA: 39-year-old female with cough, congestion, shortness
of breath, fever and asthma. Subsequent encounter.

EXAM:
CHEST  2 VIEW

[chest pa]
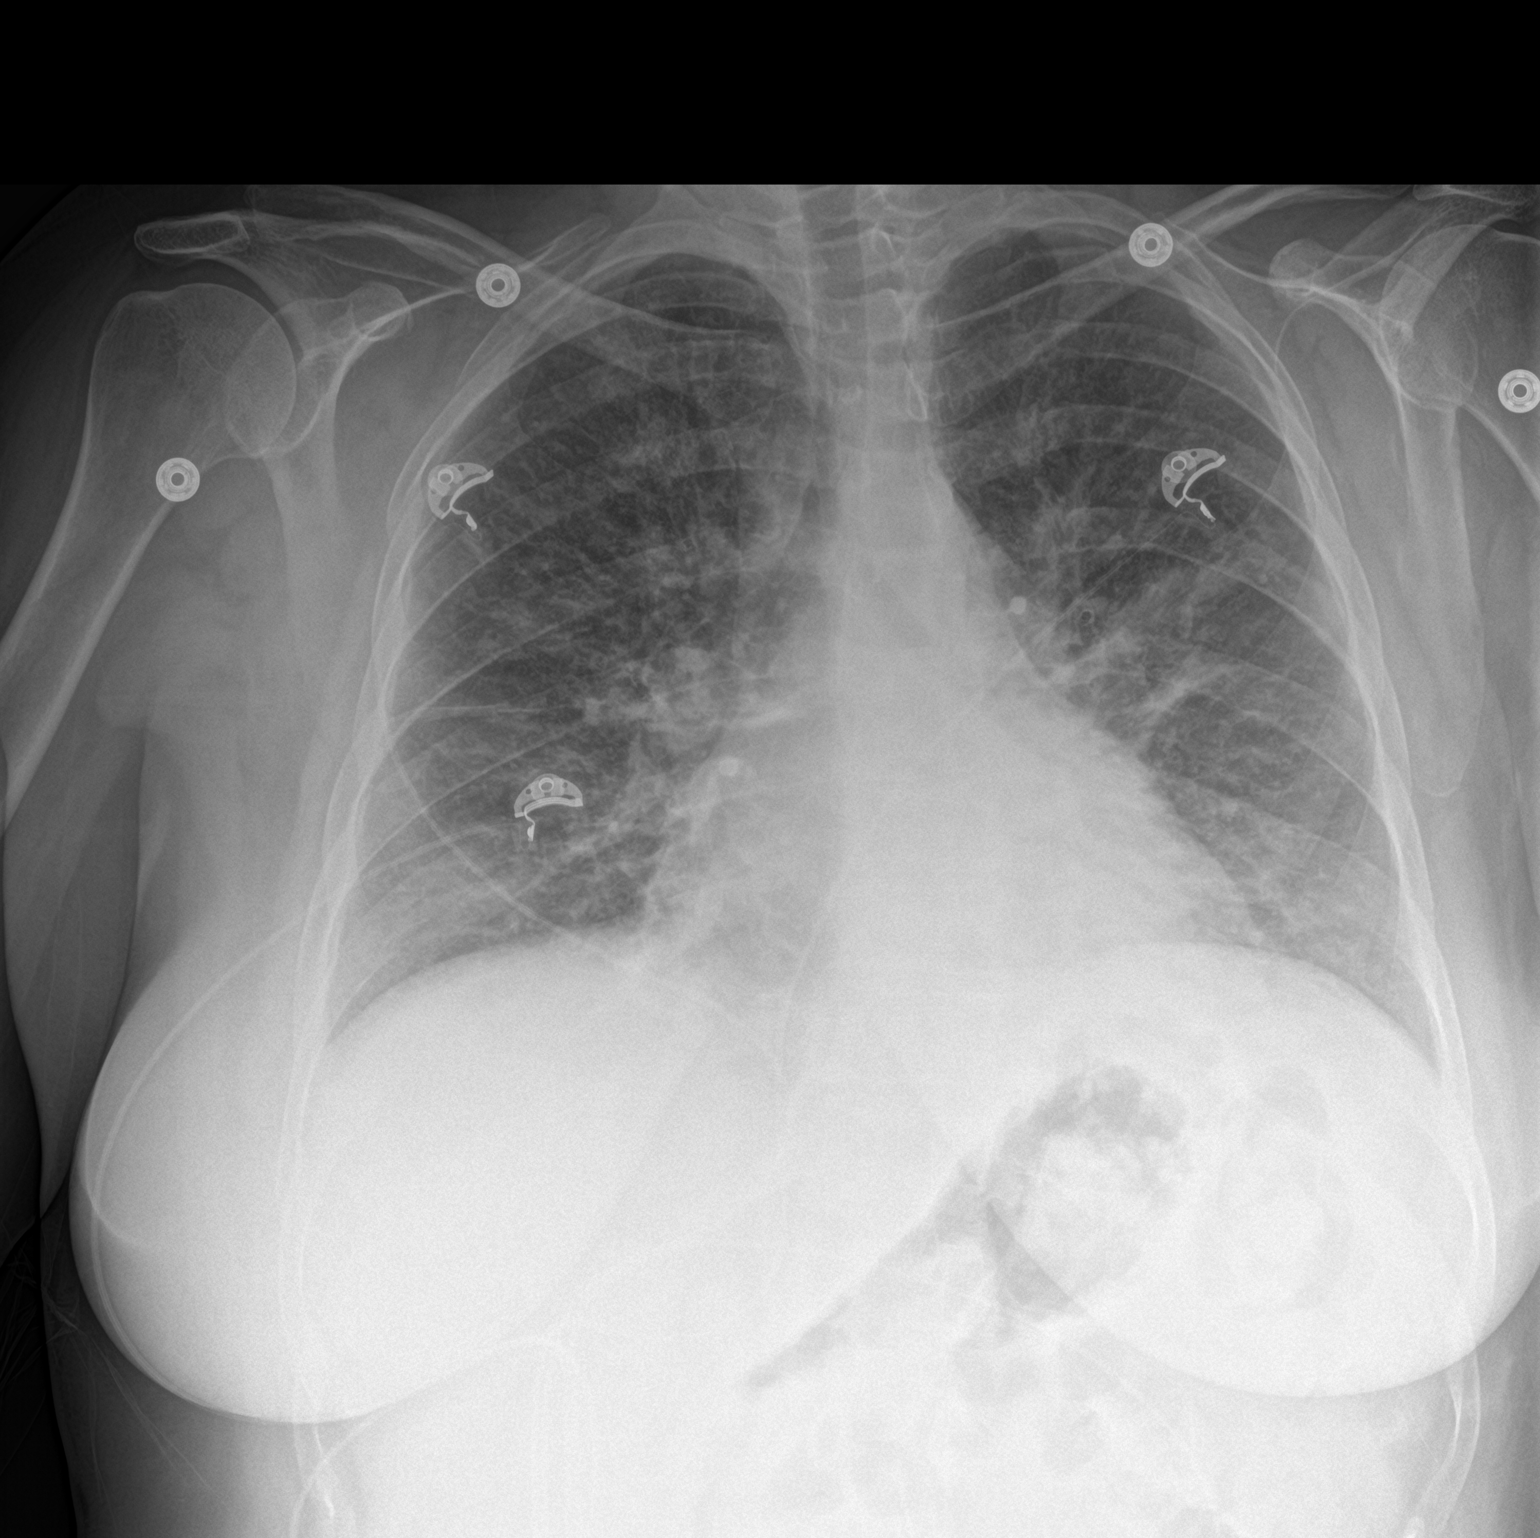

[chest lat]
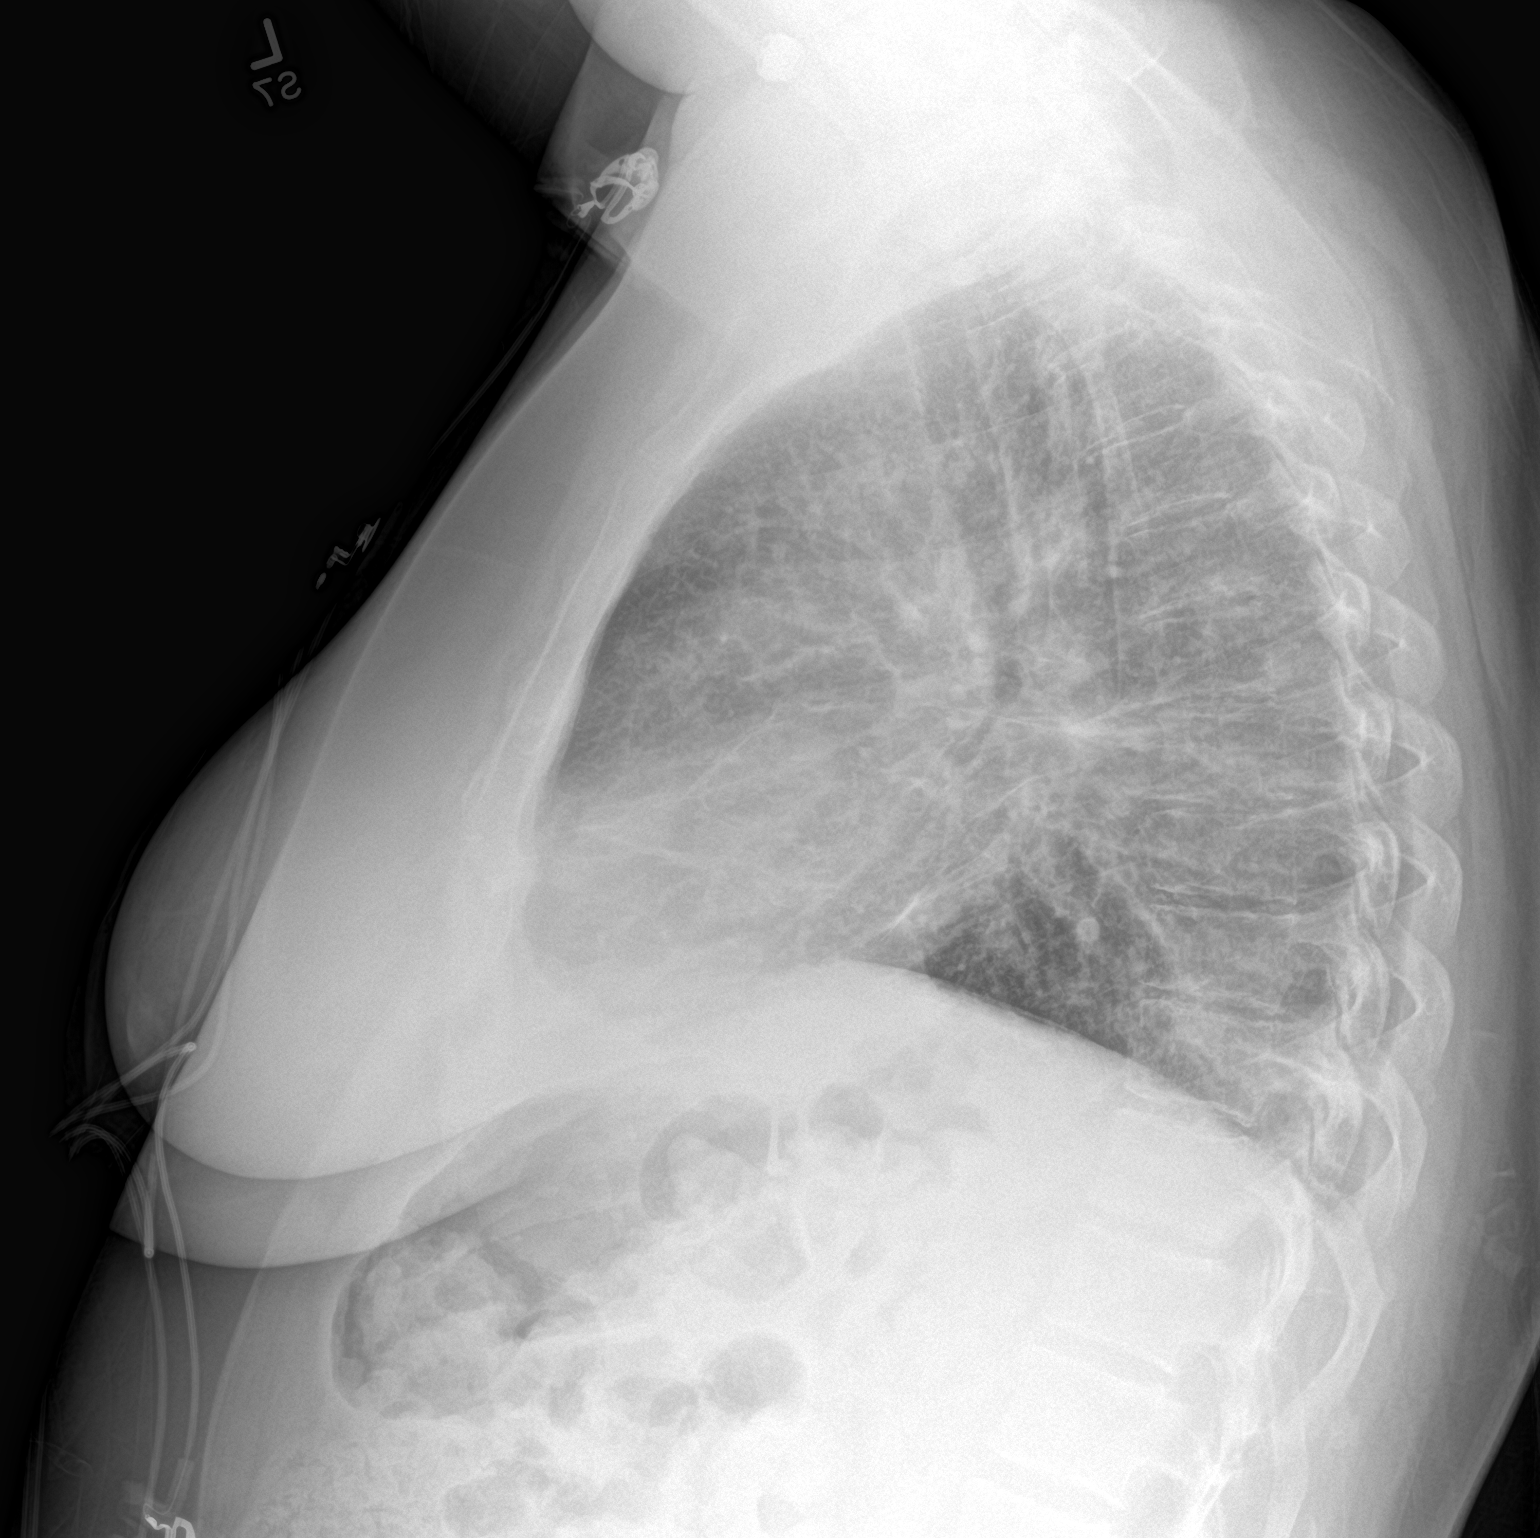

[2 of 2 positions shown; findings below may reference images not displayed]

FINDINGS: Change since recent examination. This may represent asthma
exacerbation with areas of subsegmental atelectasis (most notable
left perihilar region and medial right lung base) and superimposed
pulmonary vascular congestion.

Given the patient's history of fever, subtle infectious infiltrate
cannot be entirely excluded.

No pneumothorax.

Heart size top-normal.
IMPRESSION: Change since recent examination. This may represent asthma
exacerbation with areas of subsegmental atelectasis and superimposed
pulmonary vascular congestion.

Given the patient's history of fever, subtle infectious infiltrate
cannot be entirely excluded.

## 2018-09-15 DIAGNOSIS — Z720 Tobacco use: Secondary | ICD-10-CM

## 2018-09-15 DIAGNOSIS — I639 Cerebral infarction, unspecified: Secondary | ICD-10-CM

## 2018-09-15 DIAGNOSIS — E860 Dehydration: Secondary | ICD-10-CM

## 2018-09-15 DIAGNOSIS — F1910 Other psychoactive substance abuse, uncomplicated: Secondary | ICD-10-CM

## 2018-09-15 DIAGNOSIS — J9601 Acute respiratory failure with hypoxia: Secondary | ICD-10-CM

## 2018-09-15 DIAGNOSIS — J189 Pneumonia, unspecified organism: Secondary | ICD-10-CM

## 2018-09-16 DIAGNOSIS — I361 Nonrheumatic tricuspid (valve) insufficiency: Secondary | ICD-10-CM

## 2018-09-16 DIAGNOSIS — I10 Essential (primary) hypertension: Secondary | ICD-10-CM

## 2018-09-16 DIAGNOSIS — J189 Pneumonia, unspecified organism: Secondary | ICD-10-CM

## 2018-09-16 DIAGNOSIS — I351 Nonrheumatic aortic (valve) insufficiency: Secondary | ICD-10-CM

## 2018-09-16 DIAGNOSIS — E119 Type 2 diabetes mellitus without complications: Secondary | ICD-10-CM | POA: Diagnosis not present

## 2018-09-16 DIAGNOSIS — R7881 Bacteremia: Secondary | ICD-10-CM | POA: Diagnosis not present

## 2018-09-16 DIAGNOSIS — F1910 Other psychoactive substance abuse, uncomplicated: Secondary | ICD-10-CM | POA: Diagnosis not present

## 2018-09-16 DIAGNOSIS — I340 Nonrheumatic mitral (valve) insufficiency: Secondary | ICD-10-CM

## 2018-09-16 DIAGNOSIS — J449 Chronic obstructive pulmonary disease, unspecified: Secondary | ICD-10-CM | POA: Diagnosis not present

## 2018-09-17 DIAGNOSIS — F1910 Other psychoactive substance abuse, uncomplicated: Secondary | ICD-10-CM | POA: Diagnosis not present

## 2018-09-17 DIAGNOSIS — E119 Type 2 diabetes mellitus without complications: Secondary | ICD-10-CM | POA: Diagnosis not present

## 2018-09-17 DIAGNOSIS — R7881 Bacteremia: Secondary | ICD-10-CM | POA: Diagnosis not present

## 2018-09-17 DIAGNOSIS — J449 Chronic obstructive pulmonary disease, unspecified: Secondary | ICD-10-CM | POA: Diagnosis not present

## 2018-09-18 DIAGNOSIS — J189 Pneumonia, unspecified organism: Secondary | ICD-10-CM | POA: Diagnosis not present

## 2018-09-18 DIAGNOSIS — R7881 Bacteremia: Secondary | ICD-10-CM | POA: Diagnosis not present

## 2018-09-18 DIAGNOSIS — I330 Acute and subacute infective endocarditis: Secondary | ICD-10-CM

## 2018-09-19 DIAGNOSIS — R7881 Bacteremia: Secondary | ICD-10-CM | POA: Diagnosis not present

## 2018-09-19 DIAGNOSIS — B9561 Methicillin susceptible Staphylococcus aureus infection as the cause of diseases classified elsewhere: Secondary | ICD-10-CM | POA: Diagnosis not present

## 2018-09-19 DIAGNOSIS — J189 Pneumonia, unspecified organism: Secondary | ICD-10-CM | POA: Diagnosis not present

## 2018-09-19 DIAGNOSIS — I330 Acute and subacute infective endocarditis: Secondary | ICD-10-CM | POA: Diagnosis not present

## 2018-09-20 DIAGNOSIS — J189 Pneumonia, unspecified organism: Secondary | ICD-10-CM | POA: Diagnosis not present

## 2018-09-20 DIAGNOSIS — I330 Acute and subacute infective endocarditis: Secondary | ICD-10-CM | POA: Diagnosis not present

## 2018-09-20 DIAGNOSIS — R7881 Bacteremia: Secondary | ICD-10-CM | POA: Diagnosis not present

## 2018-09-21 DIAGNOSIS — I330 Acute and subacute infective endocarditis: Secondary | ICD-10-CM | POA: Diagnosis not present

## 2018-09-21 DIAGNOSIS — R7881 Bacteremia: Secondary | ICD-10-CM | POA: Diagnosis not present

## 2018-09-21 DIAGNOSIS — J189 Pneumonia, unspecified organism: Secondary | ICD-10-CM | POA: Diagnosis not present

## 2018-11-05 DIAGNOSIS — Z8673 Personal history of transient ischemic attack (TIA), and cerebral infarction without residual deficits: Secondary | ICD-10-CM

## 2018-11-05 HISTORY — DX: Personal history of transient ischemic attack (TIA), and cerebral infarction without residual deficits: Z86.73

## 2019-04-14 DIAGNOSIS — F1910 Other psychoactive substance abuse, uncomplicated: Secondary | ICD-10-CM | POA: Diagnosis not present

## 2019-04-14 DIAGNOSIS — Z79899 Other long term (current) drug therapy: Secondary | ICD-10-CM | POA: Diagnosis not present

## 2019-04-14 DIAGNOSIS — Z6826 Body mass index (BMI) 26.0-26.9, adult: Secondary | ICD-10-CM | POA: Diagnosis not present

## 2019-04-14 DIAGNOSIS — F1120 Opioid dependence, uncomplicated: Secondary | ICD-10-CM | POA: Diagnosis not present

## 2019-04-14 DIAGNOSIS — F419 Anxiety disorder, unspecified: Secondary | ICD-10-CM | POA: Diagnosis not present

## 2019-04-21 DIAGNOSIS — Z6826 Body mass index (BMI) 26.0-26.9, adult: Secondary | ICD-10-CM | POA: Diagnosis not present

## 2019-04-21 DIAGNOSIS — F1910 Other psychoactive substance abuse, uncomplicated: Secondary | ICD-10-CM | POA: Diagnosis not present

## 2019-04-21 DIAGNOSIS — Z79899 Other long term (current) drug therapy: Secondary | ICD-10-CM | POA: Diagnosis not present

## 2019-04-21 DIAGNOSIS — F419 Anxiety disorder, unspecified: Secondary | ICD-10-CM | POA: Diagnosis not present

## 2019-04-21 DIAGNOSIS — F1120 Opioid dependence, uncomplicated: Secondary | ICD-10-CM | POA: Diagnosis not present

## 2019-04-29 DIAGNOSIS — F419 Anxiety disorder, unspecified: Secondary | ICD-10-CM | POA: Diagnosis not present

## 2019-04-29 DIAGNOSIS — Z79899 Other long term (current) drug therapy: Secondary | ICD-10-CM | POA: Diagnosis not present

## 2019-04-29 DIAGNOSIS — F1910 Other psychoactive substance abuse, uncomplicated: Secondary | ICD-10-CM | POA: Diagnosis not present

## 2019-04-29 DIAGNOSIS — Z6824 Body mass index (BMI) 24.0-24.9, adult: Secondary | ICD-10-CM | POA: Diagnosis not present

## 2019-04-29 DIAGNOSIS — F1120 Opioid dependence, uncomplicated: Secondary | ICD-10-CM | POA: Diagnosis not present

## 2019-04-29 DIAGNOSIS — R6889 Other general symptoms and signs: Secondary | ICD-10-CM | POA: Diagnosis not present

## 2019-05-06 DIAGNOSIS — J449 Chronic obstructive pulmonary disease, unspecified: Secondary | ICD-10-CM | POA: Diagnosis not present

## 2019-05-06 DIAGNOSIS — I10 Essential (primary) hypertension: Secondary | ICD-10-CM | POA: Diagnosis not present

## 2019-05-06 DIAGNOSIS — J018 Other acute sinusitis: Secondary | ICD-10-CM | POA: Diagnosis not present

## 2019-05-06 DIAGNOSIS — E119 Type 2 diabetes mellitus without complications: Secondary | ICD-10-CM | POA: Diagnosis not present

## 2019-05-06 DIAGNOSIS — H6692 Otitis media, unspecified, left ear: Secondary | ICD-10-CM | POA: Diagnosis not present

## 2019-05-06 DIAGNOSIS — F319 Bipolar disorder, unspecified: Secondary | ICD-10-CM | POA: Diagnosis not present

## 2019-05-06 DIAGNOSIS — F17210 Nicotine dependence, cigarettes, uncomplicated: Secondary | ICD-10-CM | POA: Diagnosis not present

## 2019-08-26 DIAGNOSIS — K219 Gastro-esophageal reflux disease without esophagitis: Secondary | ICD-10-CM | POA: Diagnosis not present

## 2019-08-26 DIAGNOSIS — F319 Bipolar disorder, unspecified: Secondary | ICD-10-CM | POA: Diagnosis not present

## 2019-08-26 DIAGNOSIS — F17210 Nicotine dependence, cigarettes, uncomplicated: Secondary | ICD-10-CM | POA: Diagnosis not present

## 2019-08-26 DIAGNOSIS — E1165 Type 2 diabetes mellitus with hyperglycemia: Secondary | ICD-10-CM | POA: Diagnosis not present

## 2019-08-26 DIAGNOSIS — Z9119 Patient's noncompliance with other medical treatment and regimen: Secondary | ICD-10-CM | POA: Diagnosis not present

## 2019-08-26 DIAGNOSIS — I10 Essential (primary) hypertension: Secondary | ICD-10-CM | POA: Diagnosis not present

## 2019-09-07 DIAGNOSIS — R9431 Abnormal electrocardiogram [ECG] [EKG]: Secondary | ICD-10-CM | POA: Diagnosis not present

## 2019-09-07 DIAGNOSIS — J449 Chronic obstructive pulmonary disease, unspecified: Secondary | ICD-10-CM | POA: Diagnosis not present

## 2019-09-07 DIAGNOSIS — I110 Hypertensive heart disease with heart failure: Secondary | ICD-10-CM | POA: Diagnosis not present

## 2019-09-07 DIAGNOSIS — R0602 Shortness of breath: Secondary | ICD-10-CM | POA: Diagnosis not present

## 2019-09-07 DIAGNOSIS — K7031 Alcoholic cirrhosis of liver with ascites: Secondary | ICD-10-CM | POA: Diagnosis not present

## 2019-09-07 DIAGNOSIS — I509 Heart failure, unspecified: Secondary | ICD-10-CM | POA: Diagnosis not present

## 2019-09-07 DIAGNOSIS — R188 Other ascites: Secondary | ICD-10-CM | POA: Diagnosis not present

## 2019-09-08 DIAGNOSIS — I110 Hypertensive heart disease with heart failure: Secondary | ICD-10-CM | POA: Diagnosis not present

## 2019-09-08 DIAGNOSIS — I5031 Acute diastolic (congestive) heart failure: Secondary | ICD-10-CM | POA: Diagnosis not present

## 2019-09-08 DIAGNOSIS — Z8619 Personal history of other infectious and parasitic diseases: Secondary | ICD-10-CM | POA: Diagnosis not present

## 2019-09-08 DIAGNOSIS — Z5329 Procedure and treatment not carried out because of patient's decision for other reasons: Secondary | ICD-10-CM | POA: Diagnosis not present

## 2019-09-08 DIAGNOSIS — E119 Type 2 diabetes mellitus without complications: Secondary | ICD-10-CM

## 2019-09-08 DIAGNOSIS — F17210 Nicotine dependence, cigarettes, uncomplicated: Secondary | ICD-10-CM | POA: Diagnosis not present

## 2019-09-08 DIAGNOSIS — I340 Nonrheumatic mitral (valve) insufficiency: Secondary | ICD-10-CM | POA: Diagnosis not present

## 2019-09-08 DIAGNOSIS — K7031 Alcoholic cirrhosis of liver with ascites: Secondary | ICD-10-CM | POA: Diagnosis not present

## 2019-09-08 DIAGNOSIS — R0602 Shortness of breath: Secondary | ICD-10-CM | POA: Diagnosis not present

## 2019-09-08 DIAGNOSIS — I351 Nonrheumatic aortic (valve) insufficiency: Secondary | ICD-10-CM | POA: Diagnosis not present

## 2019-09-08 DIAGNOSIS — I509 Heart failure, unspecified: Secondary | ICD-10-CM

## 2019-09-08 DIAGNOSIS — R188 Other ascites: Secondary | ICD-10-CM | POA: Diagnosis not present

## 2019-09-08 DIAGNOSIS — I639 Cerebral infarction, unspecified: Secondary | ICD-10-CM

## 2019-09-08 DIAGNOSIS — I342 Nonrheumatic mitral (valve) stenosis: Secondary | ICD-10-CM | POA: Diagnosis not present

## 2019-09-08 DIAGNOSIS — J449 Chronic obstructive pulmonary disease, unspecified: Secondary | ICD-10-CM

## 2019-09-08 DIAGNOSIS — F1910 Other psychoactive substance abuse, uncomplicated: Secondary | ICD-10-CM

## 2019-09-08 DIAGNOSIS — I361 Nonrheumatic tricuspid (valve) insufficiency: Secondary | ICD-10-CM | POA: Diagnosis not present

## 2019-09-08 DIAGNOSIS — I69311 Memory deficit following cerebral infarction: Secondary | ICD-10-CM | POA: Diagnosis not present

## 2019-09-08 DIAGNOSIS — Z8679 Personal history of other diseases of the circulatory system: Secondary | ICD-10-CM | POA: Diagnosis not present

## 2019-09-08 DIAGNOSIS — R9431 Abnormal electrocardiogram [ECG] [EKG]: Secondary | ICD-10-CM | POA: Diagnosis not present

## 2019-09-09 DIAGNOSIS — F1910 Other psychoactive substance abuse, uncomplicated: Secondary | ICD-10-CM | POA: Diagnosis not present

## 2019-09-09 DIAGNOSIS — I5031 Acute diastolic (congestive) heart failure: Secondary | ICD-10-CM | POA: Diagnosis not present

## 2019-09-09 DIAGNOSIS — I340 Nonrheumatic mitral (valve) insufficiency: Secondary | ICD-10-CM | POA: Diagnosis not present

## 2019-09-23 DIAGNOSIS — R4182 Altered mental status, unspecified: Secondary | ICD-10-CM | POA: Diagnosis not present

## 2019-09-23 DIAGNOSIS — R0689 Other abnormalities of breathing: Secondary | ICD-10-CM | POA: Diagnosis not present

## 2019-09-23 DIAGNOSIS — R404 Transient alteration of awareness: Secondary | ICD-10-CM | POA: Diagnosis not present

## 2019-10-16 DIAGNOSIS — F1120 Opioid dependence, uncomplicated: Secondary | ICD-10-CM | POA: Diagnosis not present

## 2019-10-16 DIAGNOSIS — Z79899 Other long term (current) drug therapy: Secondary | ICD-10-CM | POA: Diagnosis not present

## 2019-10-16 DIAGNOSIS — I110 Hypertensive heart disease with heart failure: Secondary | ICD-10-CM | POA: Diagnosis not present

## 2019-10-16 DIAGNOSIS — E7800 Pure hypercholesterolemia, unspecified: Secondary | ICD-10-CM | POA: Diagnosis not present

## 2019-10-16 DIAGNOSIS — I509 Heart failure, unspecified: Secondary | ICD-10-CM | POA: Diagnosis not present

## 2019-10-16 DIAGNOSIS — F17210 Nicotine dependence, cigarettes, uncomplicated: Secondary | ICD-10-CM | POA: Diagnosis not present

## 2019-10-16 DIAGNOSIS — Z6824 Body mass index (BMI) 24.0-24.9, adult: Secondary | ICD-10-CM | POA: Diagnosis not present

## 2019-10-16 DIAGNOSIS — F1910 Other psychoactive substance abuse, uncomplicated: Secondary | ICD-10-CM | POA: Diagnosis not present

## 2019-10-16 DIAGNOSIS — E119 Type 2 diabetes mellitus without complications: Secondary | ICD-10-CM | POA: Diagnosis not present

## 2019-10-16 DIAGNOSIS — Z8673 Personal history of transient ischemic attack (TIA), and cerebral infarction without residual deficits: Secondary | ICD-10-CM | POA: Diagnosis not present

## 2019-10-16 DIAGNOSIS — F419 Anxiety disorder, unspecified: Secondary | ICD-10-CM | POA: Diagnosis not present

## 2019-10-23 DIAGNOSIS — F1910 Other psychoactive substance abuse, uncomplicated: Secondary | ICD-10-CM | POA: Diagnosis not present

## 2019-10-23 DIAGNOSIS — F419 Anxiety disorder, unspecified: Secondary | ICD-10-CM | POA: Diagnosis not present

## 2019-10-23 DIAGNOSIS — Z6824 Body mass index (BMI) 24.0-24.9, adult: Secondary | ICD-10-CM | POA: Diagnosis not present

## 2019-10-23 DIAGNOSIS — F1120 Opioid dependence, uncomplicated: Secondary | ICD-10-CM | POA: Diagnosis not present

## 2019-10-23 DIAGNOSIS — Z79899 Other long term (current) drug therapy: Secondary | ICD-10-CM | POA: Diagnosis not present

## 2019-10-30 DIAGNOSIS — F1910 Other psychoactive substance abuse, uncomplicated: Secondary | ICD-10-CM | POA: Diagnosis not present

## 2019-10-30 DIAGNOSIS — Z79899 Other long term (current) drug therapy: Secondary | ICD-10-CM | POA: Diagnosis not present

## 2019-10-30 DIAGNOSIS — F419 Anxiety disorder, unspecified: Secondary | ICD-10-CM | POA: Diagnosis not present

## 2019-10-30 DIAGNOSIS — F1120 Opioid dependence, uncomplicated: Secondary | ICD-10-CM | POA: Diagnosis not present

## 2019-10-30 DIAGNOSIS — Z6826 Body mass index (BMI) 26.0-26.9, adult: Secondary | ICD-10-CM | POA: Diagnosis not present

## 2019-11-06 DIAGNOSIS — Z6826 Body mass index (BMI) 26.0-26.9, adult: Secondary | ICD-10-CM | POA: Diagnosis not present

## 2019-11-06 DIAGNOSIS — F1120 Opioid dependence, uncomplicated: Secondary | ICD-10-CM | POA: Diagnosis not present

## 2019-11-06 DIAGNOSIS — F1910 Other psychoactive substance abuse, uncomplicated: Secondary | ICD-10-CM | POA: Diagnosis not present

## 2019-11-06 DIAGNOSIS — Z79899 Other long term (current) drug therapy: Secondary | ICD-10-CM | POA: Diagnosis not present

## 2019-11-06 DIAGNOSIS — F419 Anxiety disorder, unspecified: Secondary | ICD-10-CM | POA: Diagnosis not present

## 2019-11-13 DIAGNOSIS — F1120 Opioid dependence, uncomplicated: Secondary | ICD-10-CM | POA: Diagnosis not present

## 2019-11-13 DIAGNOSIS — Z79899 Other long term (current) drug therapy: Secondary | ICD-10-CM | POA: Diagnosis not present

## 2019-11-13 DIAGNOSIS — Z6828 Body mass index (BMI) 28.0-28.9, adult: Secondary | ICD-10-CM | POA: Diagnosis not present

## 2019-11-13 DIAGNOSIS — R6889 Other general symptoms and signs: Secondary | ICD-10-CM | POA: Diagnosis not present

## 2019-11-13 DIAGNOSIS — F1910 Other psychoactive substance abuse, uncomplicated: Secondary | ICD-10-CM | POA: Diagnosis not present

## 2019-11-13 DIAGNOSIS — F419 Anxiety disorder, unspecified: Secondary | ICD-10-CM | POA: Diagnosis not present

## 2019-11-27 DIAGNOSIS — F1120 Opioid dependence, uncomplicated: Secondary | ICD-10-CM | POA: Diagnosis not present

## 2019-11-27 DIAGNOSIS — Z6829 Body mass index (BMI) 29.0-29.9, adult: Secondary | ICD-10-CM | POA: Diagnosis not present

## 2019-11-27 DIAGNOSIS — R6889 Other general symptoms and signs: Secondary | ICD-10-CM | POA: Diagnosis not present

## 2019-11-27 DIAGNOSIS — F1910 Other psychoactive substance abuse, uncomplicated: Secondary | ICD-10-CM | POA: Diagnosis not present

## 2019-11-27 DIAGNOSIS — F419 Anxiety disorder, unspecified: Secondary | ICD-10-CM | POA: Diagnosis not present

## 2019-11-27 DIAGNOSIS — Z79899 Other long term (current) drug therapy: Secondary | ICD-10-CM | POA: Diagnosis not present

## 2019-12-09 DIAGNOSIS — Z9119 Patient's noncompliance with other medical treatment and regimen: Secondary | ICD-10-CM | POA: Diagnosis not present

## 2019-12-09 DIAGNOSIS — Z634 Disappearance and death of family member: Secondary | ICD-10-CM | POA: Diagnosis not present

## 2019-12-09 DIAGNOSIS — R6889 Other general symptoms and signs: Secondary | ICD-10-CM | POA: Diagnosis not present

## 2019-12-09 DIAGNOSIS — F4321 Adjustment disorder with depressed mood: Secondary | ICD-10-CM | POA: Diagnosis not present

## 2019-12-09 DIAGNOSIS — I10 Essential (primary) hypertension: Secondary | ICD-10-CM | POA: Diagnosis not present

## 2019-12-09 DIAGNOSIS — E1165 Type 2 diabetes mellitus with hyperglycemia: Secondary | ICD-10-CM | POA: Diagnosis not present

## 2019-12-09 DIAGNOSIS — I5040 Unspecified combined systolic (congestive) and diastolic (congestive) heart failure: Secondary | ICD-10-CM | POA: Diagnosis not present

## 2019-12-09 DIAGNOSIS — F17210 Nicotine dependence, cigarettes, uncomplicated: Secondary | ICD-10-CM | POA: Diagnosis not present

## 2019-12-11 DIAGNOSIS — R609 Edema, unspecified: Secondary | ICD-10-CM

## 2019-12-11 HISTORY — DX: Edema, unspecified: R60.9

## 2019-12-15 DIAGNOSIS — Z6829 Body mass index (BMI) 29.0-29.9, adult: Secondary | ICD-10-CM | POA: Diagnosis not present

## 2019-12-15 DIAGNOSIS — F1910 Other psychoactive substance abuse, uncomplicated: Secondary | ICD-10-CM | POA: Diagnosis not present

## 2019-12-15 DIAGNOSIS — F1120 Opioid dependence, uncomplicated: Secondary | ICD-10-CM | POA: Diagnosis not present

## 2019-12-15 DIAGNOSIS — F419 Anxiety disorder, unspecified: Secondary | ICD-10-CM | POA: Diagnosis not present

## 2019-12-15 DIAGNOSIS — Z79899 Other long term (current) drug therapy: Secondary | ICD-10-CM | POA: Diagnosis not present

## 2020-01-01 DIAGNOSIS — F1911 Other psychoactive substance abuse, in remission: Secondary | ICD-10-CM

## 2020-01-01 DIAGNOSIS — Z87898 Personal history of other specified conditions: Secondary | ICD-10-CM

## 2020-01-01 HISTORY — DX: Other psychoactive substance abuse, in remission: F19.11

## 2020-01-01 HISTORY — DX: Personal history of other specified conditions: Z87.898

## 2020-01-05 DIAGNOSIS — F1120 Opioid dependence, uncomplicated: Secondary | ICD-10-CM | POA: Diagnosis not present

## 2020-01-05 DIAGNOSIS — F419 Anxiety disorder, unspecified: Secondary | ICD-10-CM | POA: Diagnosis not present

## 2020-01-05 DIAGNOSIS — Z79899 Other long term (current) drug therapy: Secondary | ICD-10-CM | POA: Diagnosis not present

## 2020-01-05 DIAGNOSIS — F1910 Other psychoactive substance abuse, uncomplicated: Secondary | ICD-10-CM | POA: Diagnosis not present

## 2020-01-05 DIAGNOSIS — Z6829 Body mass index (BMI) 29.0-29.9, adult: Secondary | ICD-10-CM | POA: Diagnosis not present

## 2020-01-12 DIAGNOSIS — Z6829 Body mass index (BMI) 29.0-29.9, adult: Secondary | ICD-10-CM | POA: Diagnosis not present

## 2020-01-12 DIAGNOSIS — Z79899 Other long term (current) drug therapy: Secondary | ICD-10-CM | POA: Diagnosis not present

## 2020-01-12 DIAGNOSIS — R6889 Other general symptoms and signs: Secondary | ICD-10-CM | POA: Diagnosis not present

## 2020-01-12 DIAGNOSIS — F419 Anxiety disorder, unspecified: Secondary | ICD-10-CM | POA: Diagnosis not present

## 2020-01-12 DIAGNOSIS — F1120 Opioid dependence, uncomplicated: Secondary | ICD-10-CM | POA: Diagnosis not present

## 2020-01-12 DIAGNOSIS — F1910 Other psychoactive substance abuse, uncomplicated: Secondary | ICD-10-CM | POA: Diagnosis not present

## 2020-01-22 DIAGNOSIS — Z6829 Body mass index (BMI) 29.0-29.9, adult: Secondary | ICD-10-CM | POA: Diagnosis not present

## 2020-01-22 DIAGNOSIS — F1910 Other psychoactive substance abuse, uncomplicated: Secondary | ICD-10-CM | POA: Diagnosis not present

## 2020-01-22 DIAGNOSIS — F419 Anxiety disorder, unspecified: Secondary | ICD-10-CM | POA: Diagnosis not present

## 2020-01-22 DIAGNOSIS — Z79899 Other long term (current) drug therapy: Secondary | ICD-10-CM | POA: Diagnosis not present

## 2020-01-22 DIAGNOSIS — F1120 Opioid dependence, uncomplicated: Secondary | ICD-10-CM | POA: Diagnosis not present

## 2020-02-06 DIAGNOSIS — Z79899 Other long term (current) drug therapy: Secondary | ICD-10-CM | POA: Diagnosis not present

## 2020-02-06 DIAGNOSIS — F1120 Opioid dependence, uncomplicated: Secondary | ICD-10-CM | POA: Diagnosis not present

## 2020-02-06 DIAGNOSIS — Z6829 Body mass index (BMI) 29.0-29.9, adult: Secondary | ICD-10-CM | POA: Diagnosis not present

## 2020-02-06 DIAGNOSIS — F1910 Other psychoactive substance abuse, uncomplicated: Secondary | ICD-10-CM | POA: Diagnosis not present

## 2020-02-06 DIAGNOSIS — F419 Anxiety disorder, unspecified: Secondary | ICD-10-CM | POA: Diagnosis not present

## 2020-02-12 DIAGNOSIS — F1120 Opioid dependence, uncomplicated: Secondary | ICD-10-CM | POA: Diagnosis not present

## 2020-02-12 DIAGNOSIS — F1910 Other psychoactive substance abuse, uncomplicated: Secondary | ICD-10-CM | POA: Diagnosis not present

## 2020-02-12 DIAGNOSIS — F419 Anxiety disorder, unspecified: Secondary | ICD-10-CM | POA: Diagnosis not present

## 2020-02-12 DIAGNOSIS — Z79899 Other long term (current) drug therapy: Secondary | ICD-10-CM | POA: Diagnosis not present

## 2020-02-12 DIAGNOSIS — Z6829 Body mass index (BMI) 29.0-29.9, adult: Secondary | ICD-10-CM | POA: Diagnosis not present

## 2020-02-25 DIAGNOSIS — E1165 Type 2 diabetes mellitus with hyperglycemia: Secondary | ICD-10-CM | POA: Diagnosis not present

## 2020-02-25 DIAGNOSIS — K219 Gastro-esophageal reflux disease without esophagitis: Secondary | ICD-10-CM | POA: Diagnosis not present

## 2020-02-25 DIAGNOSIS — I5040 Unspecified combined systolic (congestive) and diastolic (congestive) heart failure: Secondary | ICD-10-CM | POA: Diagnosis not present

## 2020-02-25 DIAGNOSIS — F17210 Nicotine dependence, cigarettes, uncomplicated: Secondary | ICD-10-CM | POA: Diagnosis not present

## 2020-02-25 DIAGNOSIS — E785 Hyperlipidemia, unspecified: Secondary | ICD-10-CM | POA: Diagnosis not present

## 2020-02-25 DIAGNOSIS — I10 Essential (primary) hypertension: Secondary | ICD-10-CM | POA: Diagnosis not present

## 2020-02-26 DIAGNOSIS — F1910 Other psychoactive substance abuse, uncomplicated: Secondary | ICD-10-CM | POA: Diagnosis not present

## 2020-02-26 DIAGNOSIS — Z79899 Other long term (current) drug therapy: Secondary | ICD-10-CM | POA: Diagnosis not present

## 2020-02-26 DIAGNOSIS — E1165 Type 2 diabetes mellitus with hyperglycemia: Secondary | ICD-10-CM | POA: Diagnosis not present

## 2020-02-26 DIAGNOSIS — I10 Essential (primary) hypertension: Secondary | ICD-10-CM | POA: Diagnosis not present

## 2020-02-26 DIAGNOSIS — Z6829 Body mass index (BMI) 29.0-29.9, adult: Secondary | ICD-10-CM | POA: Diagnosis not present

## 2020-02-26 DIAGNOSIS — F419 Anxiety disorder, unspecified: Secondary | ICD-10-CM | POA: Diagnosis not present

## 2020-02-26 DIAGNOSIS — E782 Mixed hyperlipidemia: Secondary | ICD-10-CM | POA: Diagnosis not present

## 2020-02-26 DIAGNOSIS — F1120 Opioid dependence, uncomplicated: Secondary | ICD-10-CM | POA: Diagnosis not present

## 2020-03-11 DIAGNOSIS — Z79899 Other long term (current) drug therapy: Secondary | ICD-10-CM | POA: Diagnosis not present

## 2020-03-11 DIAGNOSIS — Z6829 Body mass index (BMI) 29.0-29.9, adult: Secondary | ICD-10-CM | POA: Diagnosis not present

## 2020-03-11 DIAGNOSIS — R6889 Other general symptoms and signs: Secondary | ICD-10-CM | POA: Diagnosis not present

## 2020-03-11 DIAGNOSIS — F419 Anxiety disorder, unspecified: Secondary | ICD-10-CM | POA: Diagnosis not present

## 2020-03-11 DIAGNOSIS — F1120 Opioid dependence, uncomplicated: Secondary | ICD-10-CM | POA: Diagnosis not present

## 2020-03-11 DIAGNOSIS — F1910 Other psychoactive substance abuse, uncomplicated: Secondary | ICD-10-CM | POA: Diagnosis not present

## 2020-03-25 DIAGNOSIS — F419 Anxiety disorder, unspecified: Secondary | ICD-10-CM | POA: Diagnosis not present

## 2020-03-25 DIAGNOSIS — Z79899 Other long term (current) drug therapy: Secondary | ICD-10-CM | POA: Diagnosis not present

## 2020-03-25 DIAGNOSIS — Z6829 Body mass index (BMI) 29.0-29.9, adult: Secondary | ICD-10-CM | POA: Diagnosis not present

## 2020-03-25 DIAGNOSIS — F1120 Opioid dependence, uncomplicated: Secondary | ICD-10-CM | POA: Diagnosis not present

## 2020-03-25 DIAGNOSIS — F1910 Other psychoactive substance abuse, uncomplicated: Secondary | ICD-10-CM | POA: Diagnosis not present

## 2020-04-02 DIAGNOSIS — F1120 Opioid dependence, uncomplicated: Secondary | ICD-10-CM | POA: Diagnosis not present

## 2020-04-02 DIAGNOSIS — F1910 Other psychoactive substance abuse, uncomplicated: Secondary | ICD-10-CM | POA: Diagnosis not present

## 2020-04-02 DIAGNOSIS — Z6829 Body mass index (BMI) 29.0-29.9, adult: Secondary | ICD-10-CM | POA: Diagnosis not present

## 2020-04-02 DIAGNOSIS — F419 Anxiety disorder, unspecified: Secondary | ICD-10-CM | POA: Diagnosis not present

## 2020-04-02 DIAGNOSIS — Z79899 Other long term (current) drug therapy: Secondary | ICD-10-CM | POA: Diagnosis not present

## 2020-04-09 DIAGNOSIS — Z6829 Body mass index (BMI) 29.0-29.9, adult: Secondary | ICD-10-CM | POA: Diagnosis not present

## 2020-04-09 DIAGNOSIS — F1910 Other psychoactive substance abuse, uncomplicated: Secondary | ICD-10-CM | POA: Diagnosis not present

## 2020-04-09 DIAGNOSIS — Z79899 Other long term (current) drug therapy: Secondary | ICD-10-CM | POA: Diagnosis not present

## 2020-04-09 DIAGNOSIS — F419 Anxiety disorder, unspecified: Secondary | ICD-10-CM | POA: Diagnosis not present

## 2020-04-09 DIAGNOSIS — F1120 Opioid dependence, uncomplicated: Secondary | ICD-10-CM | POA: Diagnosis not present

## 2020-04-16 DIAGNOSIS — F1910 Other psychoactive substance abuse, uncomplicated: Secondary | ICD-10-CM | POA: Diagnosis not present

## 2020-04-16 DIAGNOSIS — Z6829 Body mass index (BMI) 29.0-29.9, adult: Secondary | ICD-10-CM | POA: Diagnosis not present

## 2020-04-16 DIAGNOSIS — F1120 Opioid dependence, uncomplicated: Secondary | ICD-10-CM | POA: Diagnosis not present

## 2020-04-16 DIAGNOSIS — R6889 Other general symptoms and signs: Secondary | ICD-10-CM | POA: Diagnosis not present

## 2020-04-16 DIAGNOSIS — Z79899 Other long term (current) drug therapy: Secondary | ICD-10-CM | POA: Diagnosis not present

## 2020-04-16 DIAGNOSIS — F419 Anxiety disorder, unspecified: Secondary | ICD-10-CM | POA: Diagnosis not present

## 2020-04-22 DIAGNOSIS — Z79899 Other long term (current) drug therapy: Secondary | ICD-10-CM | POA: Diagnosis not present

## 2020-04-22 DIAGNOSIS — F1120 Opioid dependence, uncomplicated: Secondary | ICD-10-CM | POA: Diagnosis not present

## 2020-04-22 DIAGNOSIS — F1910 Other psychoactive substance abuse, uncomplicated: Secondary | ICD-10-CM | POA: Diagnosis not present

## 2020-04-22 DIAGNOSIS — F419 Anxiety disorder, unspecified: Secondary | ICD-10-CM | POA: Diagnosis not present

## 2020-04-22 DIAGNOSIS — Z6828 Body mass index (BMI) 28.0-28.9, adult: Secondary | ICD-10-CM | POA: Diagnosis not present

## 2020-04-22 DIAGNOSIS — R6889 Other general symptoms and signs: Secondary | ICD-10-CM | POA: Diagnosis not present

## 2020-05-07 DIAGNOSIS — F1910 Other psychoactive substance abuse, uncomplicated: Secondary | ICD-10-CM | POA: Diagnosis not present

## 2020-05-07 DIAGNOSIS — F419 Anxiety disorder, unspecified: Secondary | ICD-10-CM | POA: Diagnosis not present

## 2020-05-07 DIAGNOSIS — Z79899 Other long term (current) drug therapy: Secondary | ICD-10-CM | POA: Diagnosis not present

## 2020-05-07 DIAGNOSIS — Z6828 Body mass index (BMI) 28.0-28.9, adult: Secondary | ICD-10-CM | POA: Diagnosis not present

## 2020-05-07 DIAGNOSIS — F1120 Opioid dependence, uncomplicated: Secondary | ICD-10-CM | POA: Diagnosis not present

## 2020-05-07 DIAGNOSIS — R6889 Other general symptoms and signs: Secondary | ICD-10-CM | POA: Diagnosis not present

## 2020-05-21 DIAGNOSIS — R6889 Other general symptoms and signs: Secondary | ICD-10-CM | POA: Diagnosis not present

## 2020-05-21 DIAGNOSIS — F419 Anxiety disorder, unspecified: Secondary | ICD-10-CM | POA: Diagnosis not present

## 2020-05-21 DIAGNOSIS — F1910 Other psychoactive substance abuse, uncomplicated: Secondary | ICD-10-CM | POA: Diagnosis not present

## 2020-05-21 DIAGNOSIS — F1120 Opioid dependence, uncomplicated: Secondary | ICD-10-CM | POA: Diagnosis not present

## 2020-05-21 DIAGNOSIS — Z6830 Body mass index (BMI) 30.0-30.9, adult: Secondary | ICD-10-CM | POA: Diagnosis not present

## 2020-05-21 DIAGNOSIS — Z79899 Other long term (current) drug therapy: Secondary | ICD-10-CM | POA: Diagnosis not present

## 2020-06-04 DIAGNOSIS — F419 Anxiety disorder, unspecified: Secondary | ICD-10-CM | POA: Diagnosis not present

## 2020-06-04 DIAGNOSIS — Z79899 Other long term (current) drug therapy: Secondary | ICD-10-CM | POA: Diagnosis not present

## 2020-06-04 DIAGNOSIS — R6889 Other general symptoms and signs: Secondary | ICD-10-CM | POA: Diagnosis not present

## 2020-06-04 DIAGNOSIS — F1120 Opioid dependence, uncomplicated: Secondary | ICD-10-CM | POA: Diagnosis not present

## 2020-06-04 DIAGNOSIS — Z6830 Body mass index (BMI) 30.0-30.9, adult: Secondary | ICD-10-CM | POA: Diagnosis not present

## 2020-06-18 DIAGNOSIS — F419 Anxiety disorder, unspecified: Secondary | ICD-10-CM | POA: Diagnosis not present

## 2020-06-18 DIAGNOSIS — Z6830 Body mass index (BMI) 30.0-30.9, adult: Secondary | ICD-10-CM | POA: Diagnosis not present

## 2020-06-18 DIAGNOSIS — F1120 Opioid dependence, uncomplicated: Secondary | ICD-10-CM | POA: Diagnosis not present

## 2020-06-18 DIAGNOSIS — F1910 Other psychoactive substance abuse, uncomplicated: Secondary | ICD-10-CM | POA: Diagnosis not present

## 2020-06-18 DIAGNOSIS — R6889 Other general symptoms and signs: Secondary | ICD-10-CM | POA: Diagnosis not present

## 2020-06-18 DIAGNOSIS — Z79899 Other long term (current) drug therapy: Secondary | ICD-10-CM | POA: Diagnosis not present

## 2020-07-02 DIAGNOSIS — Z79899 Other long term (current) drug therapy: Secondary | ICD-10-CM | POA: Diagnosis not present

## 2020-07-02 DIAGNOSIS — R6889 Other general symptoms and signs: Secondary | ICD-10-CM | POA: Diagnosis not present

## 2020-07-02 DIAGNOSIS — F1910 Other psychoactive substance abuse, uncomplicated: Secondary | ICD-10-CM | POA: Diagnosis not present

## 2020-07-02 DIAGNOSIS — Z6830 Body mass index (BMI) 30.0-30.9, adult: Secondary | ICD-10-CM | POA: Diagnosis not present

## 2020-07-02 DIAGNOSIS — F419 Anxiety disorder, unspecified: Secondary | ICD-10-CM | POA: Diagnosis not present

## 2020-07-02 DIAGNOSIS — F1120 Opioid dependence, uncomplicated: Secondary | ICD-10-CM | POA: Diagnosis not present

## 2020-07-16 DIAGNOSIS — F1910 Other psychoactive substance abuse, uncomplicated: Secondary | ICD-10-CM | POA: Diagnosis not present

## 2020-07-16 DIAGNOSIS — F1120 Opioid dependence, uncomplicated: Secondary | ICD-10-CM | POA: Diagnosis not present

## 2020-07-16 DIAGNOSIS — Z79899 Other long term (current) drug therapy: Secondary | ICD-10-CM | POA: Diagnosis not present

## 2020-07-16 DIAGNOSIS — F419 Anxiety disorder, unspecified: Secondary | ICD-10-CM | POA: Diagnosis not present

## 2020-07-16 DIAGNOSIS — Z6830 Body mass index (BMI) 30.0-30.9, adult: Secondary | ICD-10-CM | POA: Diagnosis not present

## 2020-07-29 DIAGNOSIS — R0600 Dyspnea, unspecified: Secondary | ICD-10-CM | POA: Diagnosis not present

## 2020-07-29 DIAGNOSIS — G2581 Restless legs syndrome: Secondary | ICD-10-CM | POA: Diagnosis not present

## 2020-07-29 DIAGNOSIS — Z79899 Other long term (current) drug therapy: Secondary | ICD-10-CM | POA: Diagnosis not present

## 2020-07-29 DIAGNOSIS — J449 Chronic obstructive pulmonary disease, unspecified: Secondary | ICD-10-CM | POA: Diagnosis not present

## 2020-07-29 DIAGNOSIS — R6889 Other general symptoms and signs: Secondary | ICD-10-CM | POA: Diagnosis not present

## 2020-07-29 DIAGNOSIS — M545 Low back pain, unspecified: Secondary | ICD-10-CM | POA: Diagnosis not present

## 2020-07-29 DIAGNOSIS — F17210 Nicotine dependence, cigarettes, uncomplicated: Secondary | ICD-10-CM | POA: Diagnosis not present

## 2020-07-30 DIAGNOSIS — F419 Anxiety disorder, unspecified: Secondary | ICD-10-CM | POA: Diagnosis not present

## 2020-07-30 DIAGNOSIS — Z79899 Other long term (current) drug therapy: Secondary | ICD-10-CM | POA: Diagnosis not present

## 2020-07-30 DIAGNOSIS — F1120 Opioid dependence, uncomplicated: Secondary | ICD-10-CM | POA: Diagnosis not present

## 2020-07-30 DIAGNOSIS — Z6830 Body mass index (BMI) 30.0-30.9, adult: Secondary | ICD-10-CM | POA: Diagnosis not present

## 2020-07-30 DIAGNOSIS — R6889 Other general symptoms and signs: Secondary | ICD-10-CM | POA: Diagnosis not present

## 2020-07-30 DIAGNOSIS — F1910 Other psychoactive substance abuse, uncomplicated: Secondary | ICD-10-CM | POA: Diagnosis not present

## 2020-08-12 DIAGNOSIS — F1120 Opioid dependence, uncomplicated: Secondary | ICD-10-CM | POA: Diagnosis not present

## 2020-08-12 DIAGNOSIS — F1910 Other psychoactive substance abuse, uncomplicated: Secondary | ICD-10-CM | POA: Diagnosis not present

## 2020-08-12 DIAGNOSIS — F419 Anxiety disorder, unspecified: Secondary | ICD-10-CM | POA: Diagnosis not present

## 2020-08-12 DIAGNOSIS — Z79899 Other long term (current) drug therapy: Secondary | ICD-10-CM | POA: Diagnosis not present

## 2020-08-12 DIAGNOSIS — Z6830 Body mass index (BMI) 30.0-30.9, adult: Secondary | ICD-10-CM | POA: Diagnosis not present

## 2021-01-27 DIAGNOSIS — I340 Nonrheumatic mitral (valve) insufficiency: Secondary | ICD-10-CM | POA: Diagnosis not present

## 2021-01-27 DIAGNOSIS — I361 Nonrheumatic tricuspid (valve) insufficiency: Secondary | ICD-10-CM | POA: Diagnosis not present

## 2021-01-27 DIAGNOSIS — I371 Nonrheumatic pulmonary valve insufficiency: Secondary | ICD-10-CM | POA: Diagnosis not present

## 2021-01-27 DIAGNOSIS — I342 Nonrheumatic mitral (valve) stenosis: Secondary | ICD-10-CM | POA: Diagnosis not present

## 2021-03-30 ENCOUNTER — Other Ambulatory Visit: Payer: Self-pay

## 2021-03-30 DIAGNOSIS — K219 Gastro-esophageal reflux disease without esophagitis: Secondary | ICD-10-CM | POA: Insufficient documentation

## 2021-03-30 DIAGNOSIS — I509 Heart failure, unspecified: Secondary | ICD-10-CM

## 2021-03-30 DIAGNOSIS — R110 Nausea: Secondary | ICD-10-CM

## 2021-03-30 DIAGNOSIS — N179 Acute kidney failure, unspecified: Secondary | ICD-10-CM

## 2021-03-30 DIAGNOSIS — L97509 Non-pressure chronic ulcer of other part of unspecified foot with unspecified severity: Secondary | ICD-10-CM

## 2021-03-30 DIAGNOSIS — R7881 Bacteremia: Secondary | ICD-10-CM

## 2021-03-30 DIAGNOSIS — R918 Other nonspecific abnormal finding of lung field: Secondary | ICD-10-CM

## 2021-03-30 DIAGNOSIS — K759 Inflammatory liver disease, unspecified: Secondary | ICD-10-CM | POA: Insufficient documentation

## 2021-03-30 DIAGNOSIS — M129 Arthropathy, unspecified: Secondary | ICD-10-CM | POA: Insufficient documentation

## 2021-03-30 DIAGNOSIS — I330 Acute and subacute infective endocarditis: Secondary | ICD-10-CM

## 2021-03-30 DIAGNOSIS — I10 Essential (primary) hypertension: Secondary | ICD-10-CM | POA: Insufficient documentation

## 2021-03-30 DIAGNOSIS — I639 Cerebral infarction, unspecified: Secondary | ICD-10-CM

## 2021-03-30 DIAGNOSIS — Z8679 Personal history of other diseases of the circulatory system: Secondary | ICD-10-CM

## 2021-03-30 DIAGNOSIS — J189 Pneumonia, unspecified organism: Secondary | ICD-10-CM

## 2021-03-30 DIAGNOSIS — E119 Type 2 diabetes mellitus without complications: Secondary | ICD-10-CM | POA: Insufficient documentation

## 2021-03-30 DIAGNOSIS — R188 Other ascites: Secondary | ICD-10-CM

## 2021-03-30 DIAGNOSIS — E11621 Type 2 diabetes mellitus with foot ulcer: Secondary | ICD-10-CM

## 2021-03-30 DIAGNOSIS — F32A Depression, unspecified: Secondary | ICD-10-CM | POA: Insufficient documentation

## 2021-03-30 DIAGNOSIS — E871 Hypo-osmolality and hyponatremia: Secondary | ICD-10-CM | POA: Insufficient documentation

## 2021-03-30 DIAGNOSIS — G629 Polyneuropathy, unspecified: Secondary | ICD-10-CM | POA: Insufficient documentation

## 2021-03-30 DIAGNOSIS — I5031 Acute diastolic (congestive) heart failure: Secondary | ICD-10-CM

## 2021-03-30 DIAGNOSIS — J449 Chronic obstructive pulmonary disease, unspecified: Secondary | ICD-10-CM | POA: Insufficient documentation

## 2021-03-30 DIAGNOSIS — B356 Tinea cruris: Secondary | ICD-10-CM

## 2021-03-30 DIAGNOSIS — T7840XA Allergy, unspecified, initial encounter: Secondary | ICD-10-CM | POA: Insufficient documentation

## 2021-03-30 DIAGNOSIS — I38 Endocarditis, valve unspecified: Secondary | ICD-10-CM | POA: Insufficient documentation

## 2021-03-30 DIAGNOSIS — E860 Dehydration: Secondary | ICD-10-CM

## 2021-03-30 DIAGNOSIS — B9561 Methicillin susceptible Staphylococcus aureus infection as the cause of diseases classified elsewhere: Secondary | ICD-10-CM

## 2021-03-30 DIAGNOSIS — R601 Generalized edema: Secondary | ICD-10-CM

## 2021-03-30 DIAGNOSIS — R300 Dysuria: Secondary | ICD-10-CM

## 2021-03-30 DIAGNOSIS — F319 Bipolar disorder, unspecified: Secondary | ICD-10-CM | POA: Insufficient documentation

## 2021-03-30 DIAGNOSIS — F1110 Opioid abuse, uncomplicated: Secondary | ICD-10-CM | POA: Insufficient documentation

## 2021-03-30 DIAGNOSIS — E785 Hyperlipidemia, unspecified: Secondary | ICD-10-CM | POA: Insufficient documentation

## 2021-03-30 HISTORY — DX: Acute kidney failure, unspecified: N17.9

## 2021-03-30 HISTORY — DX: Nausea: R11.0

## 2021-03-30 HISTORY — DX: Hypo-osmolality and hyponatremia: E87.1

## 2021-03-30 HISTORY — DX: Other ascites: R18.8

## 2021-03-30 HISTORY — DX: Acute diastolic (congestive) heart failure: I50.31

## 2021-03-30 HISTORY — DX: Dysuria: R30.0

## 2021-03-30 HISTORY — DX: Heart failure, unspecified: I50.9

## 2021-03-30 HISTORY — DX: Methicillin susceptible Staphylococcus aureus infection as the cause of diseases classified elsewhere: B95.61

## 2021-03-30 HISTORY — DX: Non-pressure chronic ulcer of other part of unspecified foot with unspecified severity: L97.509

## 2021-03-30 HISTORY — DX: Methicillin susceptible Staphylococcus aureus infection as the cause of diseases classified elsewhere: I33.0

## 2021-03-30 HISTORY — DX: Type 2 diabetes mellitus with foot ulcer: E11.621

## 2021-03-30 HISTORY — DX: Tinea cruris: B35.6

## 2021-03-30 HISTORY — DX: Cerebral infarction, unspecified: I63.9

## 2021-03-30 HISTORY — DX: Pneumonia, unspecified organism: J18.9

## 2021-03-30 HISTORY — DX: Other nonspecific abnormal finding of lung field: R91.8

## 2021-03-30 HISTORY — DX: Personal history of other diseases of the circulatory system: Z86.79

## 2021-03-30 HISTORY — DX: Bacteremia: R78.81

## 2021-03-30 HISTORY — DX: Generalized edema: R60.1

## 2021-03-30 HISTORY — DX: Dehydration: E86.0

## 2021-03-31 ENCOUNTER — Encounter: Payer: Self-pay | Admitting: Cardiology

## 2021-03-31 ENCOUNTER — Other Ambulatory Visit: Payer: Self-pay

## 2021-03-31 ENCOUNTER — Ambulatory Visit (INDEPENDENT_AMBULATORY_CARE_PROVIDER_SITE_OTHER): Payer: Medicare Other | Admitting: Cardiology

## 2021-03-31 VITALS — BP 112/68 | HR 72 | Ht 67.0 in | Wt 186.4 lb

## 2021-03-31 DIAGNOSIS — I5030 Unspecified diastolic (congestive) heart failure: Secondary | ICD-10-CM | POA: Diagnosis not present

## 2021-03-31 DIAGNOSIS — I340 Nonrheumatic mitral (valve) insufficiency: Secondary | ICD-10-CM

## 2021-03-31 DIAGNOSIS — I5031 Acute diastolic (congestive) heart failure: Secondary | ICD-10-CM

## 2021-03-31 DIAGNOSIS — I509 Heart failure, unspecified: Secondary | ICD-10-CM | POA: Insufficient documentation

## 2021-03-31 DIAGNOSIS — I059 Rheumatic mitral valve disease, unspecified: Secondary | ICD-10-CM | POA: Diagnosis not present

## 2021-03-31 DIAGNOSIS — F17210 Nicotine dependence, cigarettes, uncomplicated: Secondary | ICD-10-CM | POA: Diagnosis not present

## 2021-03-31 DIAGNOSIS — I050 Rheumatic mitral stenosis: Secondary | ICD-10-CM

## 2021-03-31 DIAGNOSIS — I38 Endocarditis, valve unspecified: Secondary | ICD-10-CM | POA: Insufficient documentation

## 2021-03-31 NOTE — Progress Notes (Signed)
Cardiology Office Note:    Date:  03/31/2021   ID:  Emily Larson, DOB [birth year], MRN [id number]  PCP:  Adela Glimpse, NP  Cardiologist:  Garwin Brothers, MD   Referring MD: Adela Glimpse, NP    ASSESSMENT:    1. Severe mitral regurgitation by prior echocardiogram   2. Endocarditis of mitral valve   3. Heart failure, acute diastolic (HCC)   4. Diastolic heart failure due to valvular disease, unspecified heart failure chronicity (HCC)   5. Severe mitral regurgitation   6. Moderate mitral stenosis   7. Cigarette smoker    PLAN:    In order of problems listed above:  Primary prevention stressed with the patient.  Importance of compliance with diet medication stressed and she vocalized understanding. Congestive heart failure: Systolic function is well-preserved.  Patient has severe mitral regurgitation and moderate mitral stenosis.  I will refer her to our structural heart disease clinic for evaluation and needful.  She is going to need an extensive evaluation in terms of coronary artery disease, probably pulmonary evaluation and also it would probably help getting a mental health evaluation before she is committed to her major procedure like valvular cardiac surgery to fix this issue. Cigarette smoker: I spent 5 minutes with the patient discussing solely about smoking. Smoking cessation was counseled. I suggested to the patient also different medications and pharmacological interventions. Patient is keen to try stopping on its own at this time. He will get back to me if he needs any further assistance in this matter. History of bipolar disorder based on the chart.  I do not have those details.  She also has a history where MRSA endocarditis is mentioned.  I do not have those details.  She will be seen in follow-up appointment after structurally heart disease team evaluates her and does the needful.  Patient and daughter had multiple questions which were answered to their  satisfaction.   Medication Adjustments/Labs and Tests Ordered: Current medicines are reviewed at length with the patient today.  Concerns regarding medicines are outlined above.  Orders Placed This Encounter  Procedures   Ambulatory referral to Structural Heart/Valve Clinic (only at [facility])   EKG 12-Lead   No orders of the defined types were placed in this encounter.    History of Present Illness:    Emily Larson is a 45 y.o. female who is being seen today for the evaluation of congestive heart failure and severe mitral regurgitation and moderate mitral stenosis.  At the request of Adela Glimpse, NP.  Patient is a pleasant 45 year old female.  She has past medical history of endocarditis.  She was admitted to the hospital recently with congestive heart failure.  She has severe mitral regurgitation, moderate mitral stenosis and history of smoking.  She has a complex past medical history.  There is also history of bipolar disorder.  She is here for follow-up.  She denies any chest pain orthopnea or PND.  She underwent extensive diuresis at [facility] and was discharged.  Subsequently she has done well.  Her daughter accompanies her for this visit.  At the time of my evaluation, the patient is alert awake oriented and in no distress.  Past Medical History:  Diagnosis Date   Abdominal ascites 03/30/2021   Acute kidney injury (HCC) 03/30/2021   Acute respiratory failure with hypoxia and hypercapnia (HCC) 04/23/2016   Alcohol intoxication (HCC) 04/23/2016   Allergy    Anasarca 03/30/2021   Arthropathy    Asthma exacerbation  04/23/2016   Bacteremia due to methicillin susceptible Staphylococcus aureus (MSSA) 03/30/2021   Bipolar 1 disorder (HCC)    Cervical radicular pain 12/27/2012   Chronic airway obstruction (HCC)    Chronic obstructive pulmonary disease, unspecified (HCC) 05/29/2014   Chronic pain syndrome 12/27/2012   Chronic viral hepatitis C (HCC) 05/05/2016   Confusion 06/28/2014   CVA  (cerebral vascular accident) (HCC) 03/30/2021   Cyst of left ovary 01/20/2016   Depression    Diabetes mellitus without complication (HCC)    Diabetic foot ulcer (HCC) 03/30/2021   Drug overdose 04/23/2016   Dysuria 03/30/2021   Endocarditis    Mitral valve[facility] at [facility]   Endocarditis due to methicillin susceptible Staphylococcus aureus (MSSA) 03/30/2021   Endocarditis of mitral valve 06/08/2014   Esophageal reflux    Essential hypertension 05/29/2014   Fluid retention 12/11/2019   Heart failure, acute diastolic (HCC) 03/30/2021   Heart failure, unspecified (HCC) 03/30/2021   Hepatitis    History of endocarditis 03/30/2021   History of intravenous drug abuse (HCC) 01/01/2020   History of stroke 05/29/2014   Hyperlipidemia    Hypertension    Hypomenorrhea/oligomenorrhea 01/20/2016   Hyponatremia 03/30/2021   IVDU (intravenous drug user) 04/23/2016   Low back pain, unspecified 12/27/2012   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   Lung mass 03/30/2021   Major depressive disorder, recurrent, in remission, unspecified (HCC) 06/12/2014   Mitral regurgitation 05/05/2016   Nauseated 03/30/2021   Neck pain 12/27/2012   Old cerebrovascular accident (CVA) without late effect 11/05/2018   Opioid abuse (HCC)    Osteomyelitis of toe of left foot (HCC) 06/08/2014   Overdose 04/23/2016   Pneumonia 03/30/2021   Polyneuropathy    Tinea cruris 03/30/2021   Tobacco abuse 06/30/2014   Transaminitis 04/23/2016   Unspecified visual loss 06/28/2014    Past Surgical History:  Procedure Laterality Date   adnoidectomy     CESAREAN SECTION     TONSILLECTOMY      Current Medications: Current Meds  Medication Sig   cariprazine (VRAYLAR) 1.5 MG capsule Take 1.5 mg by mouth daily.   famotidine (PEPCID) 40 MG tablet Take 40 mg by mouth daily.   furosemide (LASIX) 40 MG tablet Take 40 mg by mouth 2 (two) times daily.   ipratropium-albuterol (DUONEB) 0.5-2.5 (3) MG/3ML SOLN Take 3 mLs by nebulization 3 (three) times daily.   pregabalin  (LYRICA) 150 MG capsule Take 150 mg by mouth 3 (three) times daily.   Semaglutide (RYBELSUS) 3 MG TABS Take 3 mg by mouth daily.   SUBOXONE 8-2 MG FILM Take 1 Film by mouth 2 (two) times daily.   VENTOLIN HFA 108 (90 Base) MCG/ACT inhaler Inhale 1-2 puffs into the lungs every 6 (six) hours as needed for shortness of breath.     Allergies:   Amoxicillin, Keflex [cephalexin], and Penicillins   Social History   Socioeconomic History   Marital status: Single    Spouse name: Not on file   Number of children: Not on file   Years of education: Not on file   Highest education level: Not on file  Occupational History   Not on file  Tobacco Use   Smoking status: Every Day   Smokeless tobacco: Never  Substance and Sexual Activity   Alcohol use: Yes   Drug use: Yes    Types: Cocaine, Heroin    Comment: heroin tonight   Sexual activity: Not on file  Other Topics Concern   Not on file  Social History  Narrative   Not on file   Social Determinants of Health   Financial Resource Strain: Not on file  Food Insecurity: Not on file  Transportation Needs: Not on file  Physical Activity: Not on file  Stress: Not on file  Social Connections: Not on file     Family History: The patient's family history includes ADD / ADHD in an other family member; Alcohol abuse in an other family member; Diabetes in an other family member; Hypertension in an other family member; Stroke in an other family member.  ROS:   Please see the history of present illness.    All other systems reviewed and are negative.  EKGs/Labs/Other Studies Reviewed:    The following studies were reviewed today: I discussed my findings with the patient at extensive length.   Recent Labs: No results found for requested labs within last 8760 hours.  Recent Lipid Panel No results found for: CHOL, TRIG, HDL, CHOLHDL, VLDL, LDLCALC, LDLDIRECT  Physical Exam:    VS:  BP 112/68   Pulse 72   Ht 5\' 7"  (1.702 m)   Wt 186 lb  6.4 oz (84.6 kg)   SpO2 94%   BMI 29.19 kg/m     Wt Readings from Last 3 Encounters:  03/31/21 186 lb 6.4 oz (84.6 kg)  04/23/16 199 lb 1.2 oz (90.3 kg)     GEN: Patient is in no acute distress HEENT: Normal NECK: No JVD; No carotid bruits LYMPHATICS: No lymphadenopathy CARDIAC: S1 S2 regular, 2/6 systolic murmur at the apex. RESPIRATORY:  Clear to auscultation without rales, wheezing or rhonchi  ABDOMEN: Soft, non-tender, non-distended MUSCULOSKELETAL:  No edema; No deformity  SKIN: Warm and dry NEUROLOGIC:  Alert and oriented x 3 PSYCHIATRIC:  Normal affect    Signed, 06/23/16, MD  03/31/2021 10:21 AM    [facility]

## 2021-03-31 NOTE — Patient Instructions (Signed)
Medication Instructions:  No medication changes. *If you need a refill on your cardiac medications before your next appointment, please call your pharmacy*   Lab Work: None ordered If you have labs (blood work) drawn today and your tests are completely normal, you will receive your results only by: MyChart Message (if you have MyChart) OR A paper copy in the mail If you have any lab test that is abnormal or we need to change your treatment, we will call you to review the results.   Testing/Procedures: None ordered   Follow-Up: At [facility], you and your health needs are our priority.  As part of our continuing mission to provide you with exceptional heart care, we have created designated Provider Care Teams.  These Care Teams include your primary Cardiologist (physician) and Advanced Practice Providers (APPs -  Physician Assistants and Nurse Practitioners) who all work together to provide you with the care you need, when you need it.  We recommend signing up for the patient portal called "MyChart".  Sign up information is provided on this After Visit Summary.  MyChart is used to connect with patients for Virtual Visits (Telemedicine).  Patients are able to view lab/test results, encounter notes, upcoming appointments, etc.  Non-urgent messages can be sent to your provider as well.   To learn more about what you can do with MyChart, go to [email or web address].    Your next appointment:   After appointment with structural heart.  The format for your next appointment:   In Person  Provider:   Belva Crome, MD   Other Instructions NA

## 2021-04-15 ENCOUNTER — Other Ambulatory Visit: Payer: Self-pay

## 2021-04-15 ENCOUNTER — Ambulatory Visit
Admission: RE | Admit: 2021-04-15 | Discharge: 2021-04-15 | Disposition: A | Discharge status: Home / Self Care | Payer: Self-pay | Source: Ambulatory Visit | Attending: Cardiovascular Disease | Admitting: Cardiovascular Disease

## 2021-04-15 DIAGNOSIS — I059 Rheumatic mitral valve disease, unspecified: Secondary | ICD-10-CM

## 2021-04-22 ENCOUNTER — Ambulatory Visit (INDEPENDENT_AMBULATORY_CARE_PROVIDER_SITE_OTHER): Payer: Medicare Other | Admitting: Cardiovascular Disease

## 2021-04-22 ENCOUNTER — Encounter: Payer: Self-pay | Admitting: Cardiovascular Disease

## 2021-04-22 ENCOUNTER — Other Ambulatory Visit: Payer: Self-pay

## 2021-04-22 VITALS — BP 126/80 | HR 73 | Ht 67.0 in | Wt 199.6 lb

## 2021-04-22 DIAGNOSIS — I340 Nonrheumatic mitral (valve) insufficiency: Secondary | ICD-10-CM

## 2021-04-22 DIAGNOSIS — I5033 Acute on chronic diastolic (congestive) heart failure: Secondary | ICD-10-CM

## 2021-04-22 DIAGNOSIS — I272 Pulmonary hypertension, unspecified: Secondary | ICD-10-CM

## 2021-04-22 MED ORDER — FUROSEMIDE 40 MG PO TABS: ORAL_TABLET | ORAL | 1 refills | Status: DC

## 2021-04-22 NOTE — H&P (View-Only) (Signed)
Cardiology Office Note:    Date:  04/22/2021   ID:  Emily Larson, DOB 11/27/1975, MRN [id number]  PCP:  Cranford, Tonya, NP   [facility] Providers Cardiologist:  None     Referring MD: Cranford, Tonya, NP   Chief Complaint  Patient presents with   Shortness of Breath    History of Present Illness:    Emily Larson is a 44 y.o. female referred by Dr. Revankar for evaluation of severe mitral regurgitation.  The patient is here with her friend, Robert, today. She reports a history of mitral valve endocarditis about 7 years ago, requiring a prolonged hospitalization with IV antibiotics. She remembers completing at least 6 weeks of IV antibiotics. At the time her endocarditis was related to IV drug use. She has had some relapses but has now been drug free for 2 years. She has a good support system with her children and grandchildren.   The patient was recently hospitalized last at [facility] with acute diastolic CHF.  She presented with progressive swelling in both legs and the abdomen.  This is been a chronic problem for her but worse of late.  She was diuresed in the hospital with some improvement in symptoms.  An echocardiogram demonstrated severe mitral regurgitation, moderate mitral stenosis, and severe pulmonary hypertension.  The patient was seen in follow-up by Dr. Revankar and she is referred today for evaluation of treatment options related to her valvular heart disease.  The patient has had multiple hospitalizations over the past few years.  She reports at least 3 hospitalizations for heart failure in the past year.  States that she has chronic edema and has adjusted her home diuretic dosing based on how much swelling she is experiencing.  She is also limited by shortness of breath with activity, orthopnea, and fatigue.  She has had no chest pain or pressure.  No PND.  No recent fevers or chills.  Past Medical History:  Diagnosis Date   Abdominal ascites 03/30/2021   Acute  kidney injury (HCC) 03/30/2021   Acute respiratory failure with hypoxia and hypercapnia (HCC) 04/23/2016   Alcohol intoxication (HCC) 04/23/2016   Allergy    Anasarca 03/30/2021   Arthropathy    Asthma exacerbation 04/23/2016   Bacteremia due to methicillin susceptible Staphylococcus aureus (MSSA) 03/30/2021   Bipolar 1 disorder (HCC)    Cervical radicular pain 12/27/2012   Chronic airway obstruction (HCC)    Chronic obstructive pulmonary disease, unspecified (HCC) 05/29/2014   Chronic pain syndrome 12/27/2012   Chronic viral hepatitis C (HCC) 05/05/2016   Confusion 06/28/2014   CVA (cerebral vascular accident) (HCC) 03/30/2021   Cyst of left ovary 01/20/2016   Depression    Diabetes mellitus without complication (HCC)    Diabetic foot ulcer (HCC) 03/30/2021   Drug overdose 04/23/2016   Dysuria 03/30/2021   Endocarditis    Mitral valve, at [facility] 2015   Endocarditis due to methicillin susceptible Staphylococcus aureus (MSSA) 03/30/2021   Endocarditis of mitral valve 06/08/2014   Esophageal reflux    Essential hypertension 05/29/2014   Fluid retention 12/11/2019   Heart failure, acute diastolic (HCC) 03/30/2021   Heart failure, unspecified (HCC) 03/30/2021   Hepatitis    History of endocarditis 03/30/2021   History of intravenous drug abuse (HCC) 01/01/2020   History of stroke 05/29/2014   Hyperlipidemia    Hypertension    Hypomenorrhea/oligomenorrhea 01/20/2016   Hyponatremia 03/30/2021   IVDU (intravenous drug user) 04/23/2016   Low back pain, unspecified 12/27/2012     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   Lung mass 03/30/2021   Major depressive disorder, recurrent, in remission, unspecified (HCC) 06/12/2014   Mitral regurgitation 05/05/2016   Nauseated 03/30/2021   Neck pain 12/27/2012   Old cerebrovascular accident (CVA) without late effect 11/05/2018   Opioid abuse (HCC)    Osteomyelitis of toe of left foot (HCC) 06/08/2014   Overdose 04/23/2016   Pneumonia 03/30/2021   Polyneuropathy    Tinea cruris 03/30/2021    Tobacco abuse 06/30/2014   Transaminitis 04/23/2016   Unspecified visual loss 06/28/2014    Past Surgical History:  Procedure Laterality Date   adnoidectomy     CESAREAN SECTION     TONSILLECTOMY      Current Medications: Current Meds  Medication Sig   cariprazine (VRAYLAR) 1.5 MG capsule Take 1.5 mg by mouth daily.   famotidine (PEPCID) 40 MG tablet Take 40 mg by mouth daily.   furosemide (LASIX) 40 MG tablet Take 40 mg by mouth 2 (two) times daily.   ipratropium-albuterol (DUONEB) 0.5-2.5 (3) MG/3ML SOLN Take 3 mLs by nebulization 3 (three) times daily.   pregabalin (LYRICA) 150 MG capsule Take 150 mg by mouth 3 (three) times daily.   Semaglutide (RYBELSUS) 3 MG TABS Take 3 mg by mouth daily.   SUBOXONE 8-2 MG FILM Take 1 Film by mouth 2 (two) times daily.   VENTOLIN HFA 108 (90 Base) MCG/ACT inhaler Inhale 1-2 puffs into the lungs every 6 (six) hours as needed for shortness of breath.     Allergies:   Amoxicillin, Keflex [cephalexin], and Penicillins   Social History   Socioeconomic History   Marital status: Single    Spouse name: Not on file   Number of children: Not on file   Years of education: Not on file   Highest education level: Not on file  Occupational History   Not on file  Tobacco Use   Smoking status: Every Day   Smokeless tobacco: Never  Vaping Use   Vaping Use: Every day  Substance and Sexual Activity   Alcohol use: Yes   Drug use: Yes    Types: Cocaine, Heroin    Comment: heroin tonight   Sexual activity: Not on file  Other Topics Concern   Not on file  Social History Narrative   Not on file   Social Determinants of Health   Financial Resource Strain: Not on file  Food Insecurity: Not on file  Transportation Needs: Not on file  Physical Activity: Not on file  Stress: Not on file  Social Connections: Not on file     Family History: The patient's family history includes ADD / ADHD in an other family member; Alcohol abuse in an other family  member; Diabetes in an other family member; Hypertension in an other family member; Stroke in an other family member.  ROS:   Please see the history of present illness.    All other systems reviewed and are negative.  EKGs/Labs/Other Studies Reviewed:    The following studies were reviewed today: 2D echocardiogram from [facility] is personally reviewed.  Image quality is somewhat limited by the acoustic windows.  The patient appears to have normal LV systolic function.  The left atrium is severely dilated.  There appears to be severe mitral regurgitation especially on the parasternal long axis views.  There is moderate mitral stenosis with mean transvalvular gradient of 10 mmHg.  There is severe pulmonary hypertension with RV systolic pressure estimated as high as 90 mmHg.  EKG:  EKG is not ordered today.  Recent Labs: No results found for requested labs within last 8760 hours.  Recent Lipid Panel No results found for: CHOL, TRIG, HDL, CHOLHDL, VLDL, LDLCALC, LDLDIRECT   Risk Assessment/Calculations:           Physical Exam:    VS:  BP 126/80   Pulse 73   Ht 5\' 7"  (1.702 m)   Wt 199 lb 9.6 oz (90.5 kg)   SpO2 97%   BMI 31.26 kg/m     Wt Readings from Last 3 Encounters:  04/22/21 199 lb 9.6 oz (90.5 kg)  03/31/21 186 lb 6.4 oz (84.6 kg)  04/23/16 199 lb 1.2 oz (90.3 kg)     GEN:  Well nourished, well developed in no acute distress HEENT: Normal NECK: JVP is moderately elevated with positive HJR; No carotid bruits LYMPHATICS: No lymphadenopathy CARDIAC: RRR, 2/6 systolic murmur at the left lower sternal border RESPIRATORY:  Clear to auscultation without rales, wheezing or rhonchi  ABDOMEN: Soft, non-tender, non-distended MUSCULOSKELETAL: 2+ left lower extremity edema, 1+ right lower extremity edema; No deformity  SKIN: Mild erythema of both lower legs NEUROLOGIC:  Alert and oriented x 3 PSYCHIATRIC:  Normal affect   ASSESSMENT:    1. Acute on chronic  diastolic heart failure (HCC)   2. Severe mitral regurgitation   3. Severe pulmonary hypertension (HCC)    PLAN:    In order of problems listed above:  Records from [facility] are reviewed.  Echo report and images are personally reviewed.  Laboratory data is reviewed and demonstrates markedly elevated BNP.  Her clinical symptoms, exam findings, and review of hospital records clearly indicate progression of diastolic heart failure.  I suspect this is related to her valvular heart disease as well as the presence of pulmonary hypertension.  The 2D echo imaging there is somewhat limited, and transesophageal echo will be important to better define the etiology and severity of her valvular heart disease.  I suspect she has had a longstanding agitation ever since she had endocarditis several years ago.  I reviewed the risks, indications, and alternatives to transesophageal echo with the patient.  She understands the risk of serious complication such as esophageal injury or perforation are extremely rare.  She asks if we can set the procedure up with anesthesia.  I have also recommended right and left heart catheterization to assess the hemodynamic significance of her valvular disease and better define her hemodynamics in the setting of acute on chronic heart failure.  Coronary angiography will be performed to evaluate for obstructive CAD since she may require heart valve surgery. I have reviewed the risks, indications, and alternatives to cardiac catheterization, possible angioplasty, and stenting with the patient. Risks include but are not limited to bleeding, infection, vascular injury, stroke, myocardial infection, arrhythmia, kidney injury, radiation-related injury in the case of prolonged fluoroscopy use, emergency cardiac surgery, and death. The patient understands the risks of serious complication is 1-2 in 1000 with diagnostic cardiac cath and 1-2% or less with angioplasty/stenting.   As above,  likely related to previous endocarditis.  Further assessment with transesophageal echo and cardiac catheterization pending. Patient with evidence of volume overload on exam.  I have recommended an increase in her furosemide to 80 mg in the morning and 40 mg in the afternoon.   Shared Decision Making/Informed Consent The risks [esophageal damage, perforation (1:10,000 risk), bleeding, pharyngeal hematoma as well as other potential complications associated with conscious sedation including aspiration, arrhythmia, respiratory failure and  death], benefits (treatment guidance and diagnostic support) and alternatives of a transesophageal echocardiogram were discussed in detail with Ms. Etheredge and she is willing to proceed.     Medication Adjustments/Labs and Tests Ordered: Current medicines are reviewed at length with the patient today.  Concerns regarding medicines are outlined above.  No orders of the defined types were placed in this encounter.  No orders of the defined types were placed in this encounter.   There are no Patient Instructions on file for this visit.   Signed, Tonny Bollman, MD  04/22/2021 11:10 AM    [facility]

## 2021-04-22 NOTE — Progress Notes (Signed)
Cardiology Office Note:    Date:  04/22/2021   ID:  Emily JackMandy Pegg, DOB 07/03/1976, MRN [id number]  PCP:  Adela Glimpseranford, Tonya, NP   [facility] Providers Cardiologist:  None     Referring MD: Adela Glimpseranford, Tonya, NP   Chief Complaint  Patient presents with   Shortness of Breath    History of Present Illness:    Emily Larson is a 45 y.o. female referred by Dr. Tomie Chinaevankar for evaluation of severe mitral regurgitation.  The patient is here with her friend, Molly MaduroRobert, today. She reports a history of mitral valve endocarditis about 7 years ago, requiring a prolonged hospitalization with IV antibiotics. She remembers completing at least 6 weeks of IV antibiotics. At the time her endocarditis was related to IV drug use. She has had some relapses but has now been drug free for 2 years. She has a good support system with her children and grandchildren.   The patient was recently hospitalized last at [facility] with acute diastolic CHF.  She presented with progressive swelling in both legs and the abdomen.  This is been a chronic problem for her but worse of late.  She was diuresed in the hospital with some improvement in symptoms.  An echocardiogram demonstrated severe mitral regurgitation, moderate mitral stenosis, and severe pulmonary hypertension.  The patient was seen in follow-up by Dr. Tomie Chinaevankar and she is referred today for evaluation of treatment options related to her valvular heart disease.  The patient has had multiple hospitalizations over the past few years.  She reports at least 3 hospitalizations for heart failure in the past year.  States that she has chronic edema and has adjusted her home diuretic dosing based on how much swelling she is experiencing.  She is also limited by shortness of breath with activity, orthopnea, and fatigue.  She has had no chest pain or pressure.  No PND.  No recent fevers or chills.  Past Medical History:  Diagnosis Date   Abdominal ascites 03/30/2021   Acute  kidney injury (HCC) 03/30/2021   Acute respiratory failure with hypoxia and hypercapnia (HCC) 04/23/2016   Alcohol intoxication (HCC) 04/23/2016   Allergy    Anasarca 03/30/2021   Arthropathy    Asthma exacerbation 04/23/2016   Bacteremia due to methicillin susceptible Staphylococcus aureus (MSSA) 03/30/2021   Bipolar 1 disorder (HCC)    Cervical radicular pain 12/27/2012   Chronic airway obstruction (HCC)    Chronic obstructive pulmonary disease, unspecified (HCC) 05/29/2014   Chronic pain syndrome 12/27/2012   Chronic viral hepatitis C (HCC) 05/05/2016   Confusion 06/28/2014   CVA (cerebral vascular accident) (HCC) 03/30/2021   Cyst of left ovary 01/20/2016   Depression    Diabetes mellitus without complication (HCC)    Diabetic foot ulcer (HCC) 03/30/2021   Drug overdose 04/23/2016   Dysuria 03/30/2021   Endocarditis    [facility]   Endocarditis due to methicillin susceptible Staphylococcus aureus (MSSA) 03/30/2021   Endocarditis of mitral valve 06/08/2014   Esophageal reflux    Essential hypertension 05/29/2014   Fluid retention 12/11/2019   Heart failure, acute diastolic (HCC) 03/30/2021   Heart failure, unspecified (HCC) 03/30/2021   Hepatitis    History of endocarditis 03/30/2021   History of intravenous drug abuse (HCC) 01/01/2020   History of stroke 05/29/2014   Hyperlipidemia    Hypertension    Hypomenorrhea/oligomenorrhea 01/20/2016   Hyponatremia 03/30/2021   IVDU (intravenous drug user) 04/23/2016   Low back pain, unspecified 12/27/2012  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   Lung mass 03/30/2021   Major depressive disorder, recurrent, in remission, unspecified (HCC) 06/12/2014   Mitral regurgitation 05/05/2016   Nauseated 03/30/2021   Neck pain 12/27/2012   Old cerebrovascular accident (CVA) without late effect 11/05/2018   Opioid abuse (HCC)    Osteomyelitis of toe of left foot (HCC) 06/08/2014   Overdose 04/23/2016   Pneumonia 03/30/2021   Polyneuropathy    Tinea cruris 03/30/2021    Tobacco abuse 06/30/2014   Transaminitis 04/23/2016   Unspecified visual loss 06/28/2014    Past Surgical History:  Procedure Laterality Date   adnoidectomy     CESAREAN SECTION     TONSILLECTOMY      Current Medications: Current Meds  Medication Sig   cariprazine (VRAYLAR) 1.5 MG capsule Take 1.5 mg by mouth daily.   famotidine (PEPCID) 40 MG tablet Take 40 mg by mouth daily.   furosemide (LASIX) 40 MG tablet Take 40 mg by mouth 2 (two) times daily.   ipratropium-albuterol (DUONEB) 0.5-2.5 (3) MG/3ML SOLN Take 3 mLs by nebulization 3 (three) times daily.   pregabalin (LYRICA) 150 MG capsule Take 150 mg by mouth 3 (three) times daily.   Semaglutide (RYBELSUS) 3 MG TABS Take 3 mg by mouth daily.   SUBOXONE 8-2 MG FILM Take 1 Film by mouth 2 (two) times daily.   VENTOLIN HFA 108 (90 Base) MCG/ACT inhaler Inhale 1-2 puffs into the lungs every 6 (six) hours as needed for shortness of breath.     Allergies:   Amoxicillin, Keflex [cephalexin], and Penicillins   Social History   Socioeconomic History   Marital status: Single    Spouse name: Not on file   Number of children: Not on file   Years of education: Not on file   Highest education level: Not on file  Occupational History   Not on file  Tobacco Use   Smoking status: Every Day   Smokeless tobacco: Never  Vaping Use   Vaping Use: Every day  Substance and Sexual Activity   Alcohol use: Yes   Drug use: Yes    Types: Cocaine, Heroin    Comment: heroin tonight   Sexual activity: Not on file  Other Topics Concern   Not on file  Social History Narrative   Not on file   Social Determinants of Health   Financial Resource Strain: Not on file  Food Insecurity: Not on file  Transportation Needs: Not on file  Physical Activity: Not on file  Stress: Not on file  Social Connections: Not on file     Family History: The patient's family history includes ADD / ADHD in an other family member; Alcohol abuse in an other family  member; Diabetes in an other family member; Hypertension in an other family member; Stroke in an other family member.  ROS:   Please see the history of present illness.    All other systems reviewed and are negative.  EKGs/Labs/Other Studies Reviewed:    The following studies were reviewed today: 2D echocardiogram from [facility] is personally reviewed.  Image quality is somewhat limited by the acoustic windows.  The patient appears to have normal LV systolic function.  The left atrium is severely dilated.  There appears to be severe mitral regurgitation especially on the parasternal long axis views.  There is moderate mitral stenosis with mean transvalvular gradient of 10 mmHg.  There is severe pulmonary hypertension with RV systolic pressure estimated as high as 90 mmHg.  EKG:  EKG is not ordered today.  Recent Labs: No results found for requested labs within last 8760 hours.  Recent Lipid Panel No results found for: CHOL, TRIG, HDL, CHOLHDL, VLDL, LDLCALC, LDLDIRECT   Risk Assessment/Calculations:           Physical Exam:    VS:  BP 126/80   Pulse 73   Ht 5\' 7"  (1.702 m)   Wt 199 lb 9.6 oz (90.5 kg)   SpO2 97%   BMI 31.26 kg/m     Wt Readings from Last 3 Encounters:  04/22/21 199 lb 9.6 oz (90.5 kg)  03/31/21 186 lb 6.4 oz (84.6 kg)  04/23/16 199 lb 1.2 oz (90.3 kg)     GEN:  Well nourished, well developed in no acute distress HEENT: Normal NECK: JVP is moderately elevated with positive HJR; No carotid bruits LYMPHATICS: No lymphadenopathy CARDIAC: RRR, 2/6 systolic murmur at the left lower sternal border RESPIRATORY:  Clear to auscultation without rales, wheezing or rhonchi  ABDOMEN: Soft, non-tender, non-distended MUSCULOSKELETAL: 2+ left lower extremity edema, 1+ right lower extremity edema; No deformity  SKIN: Mild erythema of both lower legs NEUROLOGIC:  Alert and oriented x 3 PSYCHIATRIC:  Normal affect   ASSESSMENT:    1. Acute on chronic  diastolic heart failure (HCC)   2. Severe mitral regurgitation   3. Severe pulmonary hypertension (HCC)    PLAN:    In order of problems listed above:  Records from [facility] are reviewed.  Echo report and images are personally reviewed.  Laboratory data is reviewed and demonstrates markedly elevated BNP.  Her clinical symptoms, exam findings, and review of hospital records clearly indicate progression of diastolic heart failure.  I suspect this is related to her valvular heart disease as well as the presence of pulmonary hypertension.  The 2D echo imaging there is somewhat limited, and transesophageal echo will be important to better define the etiology and severity of her valvular heart disease.  I suspect she has had a longstanding agitation ever since she had endocarditis several years ago.  I reviewed the risks, indications, and alternatives to transesophageal echo with the patient.  She understands the risk of serious complication such as esophageal injury or perforation are extremely rare.  She asks if we can set the procedure up with anesthesia.  I have also recommended right and left heart catheterization to assess the hemodynamic significance of her valvular disease and better define her hemodynamics in the setting of acute on chronic heart failure.  Coronary angiography will be performed to evaluate for obstructive CAD since she may require heart valve surgery. I have reviewed the risks, indications, and alternatives to cardiac catheterization, possible angioplasty, and stenting with the patient. Risks include but are not limited to bleeding, infection, vascular injury, stroke, myocardial infection, arrhythmia, kidney injury, radiation-related injury in the case of prolonged fluoroscopy use, emergency cardiac surgery, and death. The patient understands the risks of serious complication is 1-2 in 1000 with diagnostic cardiac cath and 1-2% or less with angioplasty/stenting.   As above,  likely related to previous endocarditis.  Further assessment with transesophageal echo and cardiac catheterization pending. Patient with evidence of volume overload on exam.  I have recommended an increase in her furosemide to 80 mg in the morning and 40 mg in the afternoon.   Shared Decision Making/Informed Consent The risks [esophageal damage, perforation (1:10,000 risk), bleeding, pharyngeal hematoma as well as other potential complications associated with conscious sedation including aspiration, arrhythmia, respiratory failure and  death], benefits (treatment guidance and diagnostic support) and alternatives of a transesophageal echocardiogram were discussed in detail with Ms. Etheredge and she is willing to proceed.     Medication Adjustments/Labs and Tests Ordered: Current medicines are reviewed at length with the patient today.  Concerns regarding medicines are outlined above.  No orders of the defined types were placed in this encounter.  No orders of the defined types were placed in this encounter.   There are no Patient Instructions on file for this visit.   Signed, Tonny Bollman, MD  04/22/2021 11:10 AM    [facility]

## 2021-04-22 NOTE — Patient Instructions (Addendum)
Medication Instructions:  Your physician has recommended you make the following change in your medication: Increase furosemide to 80 mg every morning and 40 mg every afternoon  *If you need a refill on your cardiac medications before your next appointment, please call your pharmacy*   Lab Work: Lab work to be done today--CBC, CMET, BNP If you have labs (blood work) drawn today and your tests are completely normal, you will receive your results only by: MyChart Message (if you have MyChart) OR A paper copy in the mail If you have any lab test that is abnormal or we need to change your treatment, we will call you to review the results.   Testing/Procedures: Your physician has requested that you have a cardiac catheterization. Cardiac catheterization is used to diagnose and/or treat various heart conditions. Doctors may recommend this procedure for a number of different reasons. The most common reason is to evaluate chest pain. Chest pain can be a symptom of coronary artery disease (CAD), and cardiac catheterization can show whether plaque is narrowing or blocking your heart's arteries. This procedure is also used to evaluate the valves, as well as measure the blood flow and oxygen levels in different parts of your heart. For further information please visit [email or web address] Please follow instruction sheet, as given.  Your physician has requested that you have a TEE. During a TEE, sound waves are used to create images of your heart. It provides your doctor with information about the size and shape of your heart and how well your heart's chambers and valves are working. In this test, a transducer is attached to the end of a flexible tube that's guided down your throat and into your esophagus (the tube leading from you mouth to your stomach) to get a more detailed image of your heart. You are not awake for the procedure. Please see the instruction sheet given to you today. For further information  please visit [email or web address]     Follow-Up: At [facility], you and your health needs are our priority.  As part of our continuing mission to provide you with exceptional heart care, we have created designated Provider Care Teams.  These Care Teams include your primary Cardiologist (physician) and Advanced Practice Providers (APPs -  Physician Assistants and Nurse Practitioners) who all work together to provide you with the care you need, when you need it.  We recommend signing up for the patient portal called "MyChart".  Sign up information is provided on this After Visit Summary.  MyChart is used to connect with patients for Virtual Visits (Telemedicine).  Patients are able to view lab/test results, encounter notes, upcoming appointments, etc.  Non-urgent messages can be sent to your provider as well.   To learn more about what you can do with MyChart, go to [email or web address].    Your next appointment:   To be arranged after procedure     Other Instructions    You are scheduled for a TEE on September 9,2022 with Dr. Eden Emms.  Please arrive at the [facility] (Main Entrance A) at [facility]: [address] [place], [place] [address] at 7:30 am   DIET: Nothing to eat or drink after midnight except a sip of water with medications (see medication instructions below)  FYI: For your safety, and to allow [place] to monitor your vital signs accurately during the surgery/procedure we request that   if you have artificial nails, gel coating, SNS etc. Please have those removed prior to your surgery/procedure. Not having  the nail coverings /polish removed may result in cancellation or delay of your surgery/procedure.   Medication Instructions:  Do not take furosemide and any diabetes medication the morning of the procedure   Bring your insurance cards.  *Special Note: Every effort is made to have your procedure done on time. Occasionally there are emergencies that occur at the  hospital that may cause delays. Please be patient if a delay does occur.     [facility] CARDIOVASCULAR DIVISION [facility] ST OFFICE [address] [place], [address] [place] [address] Dept: [phone] Loc: [phone]  Precilla Purnell  04/22/2021  You are scheduled for a Cardiac Catheterization on Friday, September 9 with Dr. Lorine Bears.  1. Please arrive at the [facility] (Main Entrance A) at [facility]: [address] [place], [place] [address] at 7:30 AM (This time is two hours before your procedure to ensure your preparation). Free valet parking service is available.   Special note: Every effort is made to have your procedure done on time. Please understand that emergencies sometimes delay scheduled procedures.  2. Diet: Do not eat solid foods after midnight.    3. Labs: done in office on September 2,2022  4. Medication instructions in preparation for your procedure:  Do not take furosemide and any diabetes medication the morning of the procedure.    Contrast Allergy: No   On the morning of your procedure, take your Aspirin 81 mg and any morning medicines NOT listed above.  You may use sips of water.  5. Plan for one night stay--bring personal belongings. 6. Bring a current list of your medications and current insurance cards. 7. You MUST have a responsible person to drive you home. 8. Someone MUST be with you the first 24 hours after you arrive home or your discharge will be delayed. 9. Please wear clothes that are easy to get on and off and wear slip-on shoes.  Thank you for allowing [place] to care for you!   -- [facility]

## 2021-04-23 LAB — COMPREHENSIVE METABOLIC PANEL
ALT: 10 IU/L (ref 0–32)
AST: 17 IU/L (ref 0–40)
Albumin/Globulin Ratio: 1.4 (ref 1.2–2.2)
Albumin: 4.3 g/dL (ref 3.8–4.8)
Alkaline Phosphatase: 121 IU/L (ref 44–121)
BUN/Creatinine Ratio: 17 (ref 9–23)
BUN: 20 mg/dL (ref 6–24)
Bilirubin Total: 0.8 mg/dL (ref 0.0–1.2)
CO2: 26 mmol/L (ref 20–29)
Calcium: 9 mg/dL (ref 8.7–10.2)
Chloride: 100 mmol/L (ref 96–106)
Creatinine, Ser: 1.19 mg/dL — ABNORMAL HIGH (ref 0.57–1.00)
Globulin, Total: 3 g/dL (ref 1.5–4.5)
Glucose: 241 mg/dL — ABNORMAL HIGH (ref 65–99)
Potassium: 3.6 mmol/L (ref 3.5–5.2)
Sodium: 141 mmol/L (ref 134–144)
Total Protein: 7.3 g/dL (ref 6.0–8.5)
eGFR: 59 mL/min/{1.73_m2} — ABNORMAL LOW (ref >59)

## 2021-04-23 LAB — CBC
Hematocrit: 48 % — ABNORMAL HIGH (ref 34.0–46.6)
Hemoglobin: 15.3 g/dL (ref 11.1–15.9)
MCH: 28.2 pg (ref 26.6–33.0)
MCHC: 31.9 g/dL (ref 31.5–35.7)
MCV: 88 fL (ref 79–97)
Platelets: 104 10*3/uL — ABNORMAL LOW (ref 150–450)
RBC: 5.43 x10E6/uL — ABNORMAL HIGH (ref 3.77–5.28)
RDW: 14.6 % (ref 11.7–15.4)
WBC: 7.9 10*3/uL (ref 3.4–10.8)

## 2021-04-23 LAB — PRO B NATRIURETIC PEPTIDE: NT-Pro BNP: 3347 pg/mL — ABNORMAL HIGH (ref 0–130)

## 2021-04-28 ENCOUNTER — Other Ambulatory Visit: Payer: Self-pay | Admitting: Cardiovascular Disease

## 2021-04-28 ENCOUNTER — Telehealth: Payer: Self-pay | Admitting: *Deleted

## 2021-04-28 DIAGNOSIS — I340 Nonrheumatic mitral (valve) insufficiency: Secondary | ICD-10-CM

## 2021-04-28 NOTE — Telephone Encounter (Signed)
Cardiac catheterization scheduled at [facility] for: Friday April 29, 2021 9:15 AM/TEE 8:30 AM [facility] Main Entrance A [facility]) at: 7:30 AM   Nothing to eat or drink after midnight prior to procedures, may have sips of water to take medications.  Medication instructions: Hold: Lasix-AM of procedure Rybelsus-AM of procedure  Except hold medications morning medications can be taken pre-cath with sips of water including aspirin 81 mg.    Confirmed patient has responsible adult to drive home post procedure and be with patient first 24 hours after arriving home.  Patients are allowed one visitor in the waiting room during the time they are at the hospital for their procedure. Both patient and visitor must wear a mask once they enter the hospital.   Patient reports does not currently have any symptoms concerning for COVID-19 and no household members with COVID-19 like illness.      Reviewed procedure/mask/visitor instructions with patient.

## 2021-04-29 ENCOUNTER — Other Ambulatory Visit: Payer: Self-pay

## 2021-04-29 ENCOUNTER — Ambulatory Visit (HOSPITAL_BASED_OUTPATIENT_CLINIC_OR_DEPARTMENT_OTHER): Payer: Medicare Other

## 2021-04-29 ENCOUNTER — Ambulatory Visit (HOSPITAL_COMMUNITY): Payer: Medicare Other | Admitting: Certified Registered"

## 2021-04-29 ENCOUNTER — Ambulatory Visit (HOSPITAL_COMMUNITY)
Admission: RE | Admit: 2021-04-29 | Discharge: 2021-04-29 | Disposition: A | Discharge status: Home / Self Care | Payer: Medicare Other | Attending: Cardiovascular Disease | Admitting: Cardiovascular Disease

## 2021-04-29 ENCOUNTER — Encounter (HOSPITAL_COMMUNITY)
Admission: RE | Disposition: A | Discharge status: Home / Self Care | Payer: Self-pay | Source: Home / Self Care | Attending: Cardiovascular Disease

## 2021-04-29 ENCOUNTER — Ambulatory Visit (HOSPITAL_COMMUNITY): Admission: RE | Admit: 2021-04-29 | Payer: Medicare Other | Source: Home / Self Care | Admitting: Cardiovascular Disease

## 2021-04-29 ENCOUNTER — Encounter (HOSPITAL_COMMUNITY): Payer: Self-pay | Admitting: Cardiovascular Disease

## 2021-04-29 DIAGNOSIS — Z880 Allergy status to penicillin: Secondary | ICD-10-CM | POA: Insufficient documentation

## 2021-04-29 DIAGNOSIS — Z79899 Other long term (current) drug therapy: Secondary | ICD-10-CM | POA: Diagnosis not present

## 2021-04-29 DIAGNOSIS — I081 Rheumatic disorders of both mitral and tricuspid valves: Secondary | ICD-10-CM | POA: Insufficient documentation

## 2021-04-29 DIAGNOSIS — F17200 Nicotine dependence, unspecified, uncomplicated: Secondary | ICD-10-CM | POA: Insufficient documentation

## 2021-04-29 DIAGNOSIS — I361 Nonrheumatic tricuspid (valve) insufficiency: Secondary | ICD-10-CM

## 2021-04-29 DIAGNOSIS — I5033 Acute on chronic diastolic (congestive) heart failure: Secondary | ICD-10-CM | POA: Insufficient documentation

## 2021-04-29 DIAGNOSIS — I110 Hypertensive heart disease with heart failure: Secondary | ICD-10-CM | POA: Insufficient documentation

## 2021-04-29 DIAGNOSIS — I340 Nonrheumatic mitral (valve) insufficiency: Secondary | ICD-10-CM

## 2021-04-29 DIAGNOSIS — I342 Nonrheumatic mitral (valve) stenosis: Secondary | ICD-10-CM

## 2021-04-29 DIAGNOSIS — Z881 Allergy status to other antibiotic agents status: Secondary | ICD-10-CM | POA: Diagnosis not present

## 2021-04-29 DIAGNOSIS — I272 Pulmonary hypertension, unspecified: Secondary | ICD-10-CM | POA: Diagnosis not present

## 2021-04-29 HISTORY — PX: RIGHT/LEFT HEART CATH AND CORONARY ANGIOGRAPHY: CATH118266

## 2021-04-29 HISTORY — PX: TEE WITHOUT CARDIOVERSION: SHX5443

## 2021-04-29 LAB — POCT I-STAT EG7
Acid-Base Excess: 4 mmol/L — ABNORMAL HIGH (ref 0.0–2.0)
Bicarbonate: 31.1 mmol/L — ABNORMAL HIGH (ref 20.0–28.0)
Calcium, Ion: 1.16 mmol/L (ref 1.15–1.40)
HCT: 45 % (ref 36.0–46.0)
Hemoglobin: 15.3 g/dL — ABNORMAL HIGH (ref 12.0–15.0)
O2 Saturation: 52 %
Potassium: 3.1 mmol/L — ABNORMAL LOW (ref 3.5–5.1)
Sodium: 142 mmol/L (ref 135–145)
TCO2: 33 mmol/L — ABNORMAL HIGH (ref 22–32)
pCO2, Ven: 56.7 mmHg (ref 44.0–60.0)
pH, Ven: 7.347 (ref 7.250–7.430)
pO2, Ven: 30 mmHg — CL (ref 32.0–45.0)

## 2021-04-29 LAB — ECHO TEE
Area-P 1/2: 2.75 cm2
MV M vel: 3.15 m/s
MV Peak grad: 39.7 mmHg
Radius: 1 cm

## 2021-04-29 LAB — POCT I-STAT 7, (LYTES, BLD GAS, ICA,H+H)
Acid-Base Excess: 2 mmol/L (ref 0.0–2.0)
Bicarbonate: 28.1 mmol/L — ABNORMAL HIGH (ref 20.0–28.0)
Calcium, Ion: 1.12 mmol/L — ABNORMAL LOW (ref 1.15–1.40)
HCT: 43 % (ref 36.0–46.0)
Hemoglobin: 14.6 g/dL (ref 12.0–15.0)
O2 Saturation: 92 %
Potassium: 3 mmol/L — ABNORMAL LOW (ref 3.5–5.1)
Sodium: 143 mmol/L (ref 135–145)
TCO2: 30 mmol/L (ref 22–32)
pCO2 arterial: 48 mmHg (ref 32.0–48.0)
pH, Arterial: 7.376 (ref 7.350–7.450)
pO2, Arterial: 66 mmHg — ABNORMAL LOW (ref 83.0–108.0)

## 2021-04-29 SURGERY — RIGHT/LEFT HEART CATH AND CORONARY ANGIOGRAPHY
Anesthesia: LOCAL

## 2021-04-29 SURGERY — ECHOCARDIOGRAM, TRANSESOPHAGEAL
Anesthesia: Monitor Anesthesia Care

## 2021-04-29 MED ORDER — EPHEDRINE SULFATE-NACL 50-0.9 MG/10ML-% IV SOSY
PREFILLED_SYRINGE | INTRAVENOUS | Status: DC | PRN
  Administered 2021-04-29: 10 mg via INTRAVENOUS
  Administered 2021-04-29 (×3): 5 mg via INTRAVENOUS

## 2021-04-29 MED ORDER — IOHEXOL 350 MG/ML SOLN
INTRAVENOUS | Status: DC | PRN
  Administered 2021-04-29: 65 mL

## 2021-04-29 MED ORDER — SODIUM CHLORIDE 0.9% FLUSH: 3.0000 mL | Freq: Two times a day (BID) | INTRAVENOUS | Status: DC

## 2021-04-29 MED ORDER — FENTANYL CITRATE (PF) 100 MCG/2ML IJ SOLN
INTRAMUSCULAR | Status: DC | PRN
  Administered 2021-04-29: 25 ug via INTRAVENOUS

## 2021-04-29 MED ORDER — LIDOCAINE HCL (PF) 1 % IJ SOLN
INTRAMUSCULAR | Status: DC | PRN
  Administered 2021-04-29 (×2): 2 mL

## 2021-04-29 MED ORDER — VERAPAMIL HCL 2.5 MG/ML IV SOLN
INTRAVENOUS | Status: DC | PRN
  Administered 2021-04-29 (×2): 10 mL via INTRA_ARTERIAL

## 2021-04-29 MED ORDER — SODIUM CHLORIDE 0.9 % IV SOLN: 250.0000 mL | INTRAVENOUS | Status: DC | PRN

## 2021-04-29 MED ORDER — SODIUM CHLORIDE 0.9 % IV SOLN: INTRAVENOUS | Status: DC

## 2021-04-29 MED ORDER — MIDAZOLAM HCL 2 MG/2ML IJ SOLN
INTRAMUSCULAR | Status: DC | PRN
  Administered 2021-04-29 (×2): 1 mg via INTRAVENOUS

## 2021-04-29 MED ORDER — PHENYLEPHRINE 40 MCG/ML (10ML) SYRINGE FOR IV PUSH (FOR BLOOD PRESSURE SUPPORT)
PREFILLED_SYRINGE | INTRAVENOUS | Status: DC | PRN
  Administered 2021-04-29: 80 ug via INTRAVENOUS
  Administered 2021-04-29: 120 ug via INTRAVENOUS
  Administered 2021-04-29: 80 ug via INTRAVENOUS
  Administered 2021-04-29 (×2): 120 ug via INTRAVENOUS

## 2021-04-29 MED ORDER — HEPARIN (PORCINE) IN NACL 1000-0.9 UT/500ML-% IV SOLN
INTRAVENOUS | Status: DC | PRN
  Administered 2021-04-29 (×2): 500 mL

## 2021-04-29 MED ORDER — ACETAMINOPHEN 325 MG PO TABS: 650.0000 mg | ORAL_TABLET | ORAL | Status: DC | PRN

## 2021-04-29 MED ORDER — SODIUM CHLORIDE 0.9 % WEIGHT BASED INFUSION: 1.0000 mL/kg/h | INTRAVENOUS | Status: DC

## 2021-04-29 MED ORDER — SODIUM CHLORIDE 0.9% FLUSH: 3.0000 mL | INTRAVENOUS | Status: DC | PRN

## 2021-04-29 MED ORDER — PROPOFOL 500 MG/50ML IV EMUL
INTRAVENOUS | Status: DC | PRN
  Administered 2021-04-29: 200 ug/kg/min via INTRAVENOUS

## 2021-04-29 MED ORDER — HEPARIN SODIUM (PORCINE) 1000 UNIT/ML IJ SOLN
INTRAMUSCULAR | Status: DC | PRN
  Administered 2021-04-29: 4500 [IU] via INTRAVENOUS

## 2021-04-29 MED ORDER — ASPIRIN 81 MG PO CHEW: 81.0000 mg | CHEWABLE_TABLET | ORAL | Status: DC

## 2021-04-29 MED ORDER — SODIUM CHLORIDE 0.9 % WEIGHT BASED INFUSION: 3.0000 mL/kg/h | INTRAVENOUS | Status: AC

## 2021-04-29 MED ORDER — LIDOCAINE 2% (20 MG/ML) 5 ML SYRINGE
INTRAMUSCULAR | Status: DC | PRN
  Administered 2021-04-29: 100 mg via INTRAVENOUS

## 2021-04-29 MED ORDER — ONDANSETRON HCL 4 MG/2ML IJ SOLN: 4.0000 mg | Freq: Four times a day (QID) | INTRAMUSCULAR | Status: DC | PRN

## 2021-04-29 SURGICAL SUPPLY — 16 items
CATH BALLN WEDGE 5F 110CM (CATHETERS) ×1 IMPLANT
CATH INFINITI 5 FR JL3.5 (CATHETERS) ×1 IMPLANT
CATH INFINITI 5FR ANG PIGTAIL (CATHETERS) ×1 IMPLANT
CATH INFINITI 5FR JK (CATHETERS) ×1 IMPLANT
DEVICE RAD COMP TR BAND LRG (VASCULAR PRODUCTS) ×1 IMPLANT
GLIDESHEATH SLEND SS 6F .021 (SHEATH) ×1 IMPLANT
GUIDEWIRE INQWIRE 1.5J.035X260 (WIRE) IMPLANT
INQWIRE 1.5J .035X260CM (WIRE) ×2
KIT HEART LEFT (KITS) ×2 IMPLANT
PACK CARDIAC CATHETERIZATION (CUSTOM PROCEDURE TRAY) ×2 IMPLANT
SHEATH GLIDE SLENDER 4/5FR (SHEATH) ×1 IMPLANT
SHEATH PROBE COVER 6X72 (BAG) ×1 IMPLANT
TRANSDUCER W/STOPCOCK (MISCELLANEOUS) ×3 IMPLANT
TUBING ART PRESS 72  MALE/FEM (TUBING) ×2
TUBING ART PRESS 72 MALE/FEM (TUBING) IMPLANT
TUBING CIL FLEX 10 FLL-RA (TUBING) ×2 IMPLANT

## 2021-04-29 NOTE — CV Procedure (Signed)
TEE: Anesthesia: Propofol Patient with low BP and sats through out procedure Limited Time to keep probe down  Moderate MS moderate to severe MR Valve not repairable. Heavily calcified with  Likely old/chronic sequelae of anterior leaflet Vegetation  Normal AV Moderate TR No ASD No LAA thrombus EF 60%  Normal RV No effusion  See full report in [employer] 3D imaging of MV performed   Charlton Haws MD [facility]

## 2021-04-29 NOTE — Transfer of Care (Signed)
Immediate Anesthesia Transfer of Care Note  Patient: Emily Larson  Procedure(s) Performed: TRANSESOPHAGEAL ECHOCARDIOGRAM (TEE)  Patient Location: Endoscopy Unit  Anesthesia Type:MAC  Level of Consciousness: sedated, patient cooperative and responds to stimulation  Airway & Oxygen Therapy: Patient Spontanous Breathing and Patient connected to nasal cannula oxygen  Post-op Assessment: Report given to RN and Post -op Vital signs reviewed and stable  Post vital signs: Reviewed and stable  Last Vitals:  Vitals Value Taken Time  BP    Temp    Pulse    Resp    SpO2      Last Pain:  Vitals:   04/29/21 0745  TempSrc: Temporal  PainSc: 0-No pain         Complications: No notable events documented.

## 2021-04-29 NOTE — Interval H&P Note (Signed)
History and Physical Interval Note:  04/29/2021 9:52 AM  Emily Larson  has presented today for surgery, with the diagnosis of MR.  The various methods of treatment have been discussed with the patient and family. After consideration of risks, benefits and other options for treatment, the patient has consented to  Procedure(s): RIGHT/LEFT HEART CATH AND CORONARY ANGIOGRAPHY (N/A) as a surgical intervention.  The patient's history has been reviewed, patient examined, no change in status, stable for surgery.  I have reviewed the patient's chart and labs.  Questions were answered to the patient's satisfaction.     Lorine Bears

## 2021-04-29 NOTE — Interval H&P Note (Signed)
History and Physical Interval Note:  04/29/2021 7:33 AM  Emily Larson  has presented today for surgery, with the diagnosis of MITRAL REGURGITATION.  The various methods of treatment have been discussed with the patient and family. After consideration of risks, benefits and other options for treatment, the patient has consented to  Procedure(s) with comments: TRANSESOPHAGEAL ECHOCARDIOGRAM (TEE) (N/A) - CATH AFTER PROCEDURE as a surgical intervention.  The patient's history has been reviewed, patient examined, no change in status, stable for surgery.  I have reviewed the patient's chart and labs.  Questions were answered to the patient's satisfaction.     Charlton Haws

## 2021-04-29 NOTE — Progress Notes (Signed)
  Echocardiogram Echocardiogram Transesophageal has been performed.  Roosvelt Maser F 04/29/2021, 9:31 AM

## 2021-04-29 NOTE — Anesthesia Preprocedure Evaluation (Signed)
Anesthesia Evaluation  Patient identified by MRN, date of birth, ID band Patient awake    Reviewed: Allergy & Precautions, NPO status , Patient's Chart, lab work & pertinent test results  History of Anesthesia Complications Negative for: history of anesthetic complications  Airway Mallampati: IV  TM Distance: >3 FB Neck ROM: Full    Dental  (+) Dental Advisory Given   Pulmonary asthma , COPD,  COPD inhaler, Current Smoker and Patient abstained from smoking.,    breath sounds clear to auscultation       Cardiovascular hypertension, +CHF  + Valvular Problems/Murmurs MR  Rhythm:Regular     Neuro/Psych PSYCHIATRIC DISORDERS Depression Bipolar Disorder CVA    GI/Hepatic GERD  Medicated,(+) Hepatitis -, C  Endo/Other  diabetes  Renal/GU Renal InsufficiencyRenal diseaseLab Results      Component                Value               Date                      CREATININE               1.19 (H)            04/22/2021                Musculoskeletal   Abdominal   Peds  Hematology   Anesthesia Other Findings   Reproductive/Obstetrics                             Anesthesia Physical Anesthesia Plan  ASA: 3  Anesthesia Plan: MAC   Post-op Pain Management:    Induction:   PONV Risk Score and Plan: 1 and Propofol infusion and Treatment may vary due to age or medical condition  Airway Management Planned: Nasal Cannula  Additional Equipment: None  Intra-op Plan:   Post-operative Plan:   Informed Consent: I have reviewed the patients History and Physical, chart, labs and discussed the procedure including the risks, benefits and alternatives for the proposed anesthesia with the patient or authorized representative who has indicated his/her understanding and acceptance.     Dental advisory given  Plan Discussed with: CRNA and Anesthesiologist  Anesthesia Plan Comments:         Anesthesia  Quick Evaluation

## 2021-04-30 ENCOUNTER — Encounter (HOSPITAL_COMMUNITY): Payer: Self-pay | Admitting: Cardiovascular Disease

## 2021-05-02 ENCOUNTER — Encounter (HOSPITAL_COMMUNITY): Payer: Self-pay | Admitting: Cardiovascular Disease

## 2021-05-02 NOTE — Anesthesia Postprocedure Evaluation (Signed)
Anesthesia Post Note  Patient: Emily Larson  Procedure(s) Performed: TRANSESOPHAGEAL ECHOCARDIOGRAM (TEE)     Patient location during evaluation: Endoscopy Anesthesia Type: MAC Level of consciousness: awake and alert Pain management: pain level controlled Vital Signs Assessment: post-procedure vital signs reviewed and stable Respiratory status: spontaneous breathing, nonlabored ventilation, respiratory function stable and patient connected to nasal cannula oxygen Cardiovascular status: stable and blood pressure returned to baseline Postop Assessment: no apparent nausea or vomiting Anesthetic complications: no   No notable events documented.  Last Vitals:  Vitals:   04/29/21 1250 04/29/21 1300  BP:  (!) 84/51  Pulse: (!) 52 (!) 50  Resp:    Temp:    SpO2: (!) 89% 90%    Last Pain:  Vitals:   04/29/21 1100  TempSrc:   PainSc: 0-No pain                 Fronie Holstein

## 2021-05-11 ENCOUNTER — Telehealth: Payer: Self-pay | Admitting: Cardiovascular Disease

## 2021-05-11 NOTE — Telephone Encounter (Signed)
Spoke with patient and reviewed test results per her request. Questions were answered to her satisfaction. She is aware that she should receive a call from HF clinic to schedule an appointment. She states she was told by Dr. Tomie China that she needed open heart surgery. I advised her that she has seen multiple providers recently and that she should contact his office with specific questions regarding things he has stated. She verbalized understanding and agreement and thanked me for the call.

## 2021-05-11 NOTE — Telephone Encounter (Signed)
Follow up:     Patient returning a call concerning results.

## 2021-05-11 NOTE — Telephone Encounter (Signed)
Patient called and wants to know about the result of her tests that was done and also what's next. Also wants to know who should she make an appt with. Please call back

## 2021-05-11 NOTE — Telephone Encounter (Signed)
Per [facility] RN, patient is supposed to get a call from HF clinic to schedule an appointment. Left message for patient to call back and advised her that she should expect a call from them to get scheduled. Also advised she may call back for additional details.

## 2021-05-13 ENCOUNTER — Other Ambulatory Visit: Payer: Self-pay

## 2021-05-13 DIAGNOSIS — I340 Nonrheumatic mitral (valve) insufficiency: Secondary | ICD-10-CM

## 2021-05-13 DIAGNOSIS — I272 Pulmonary hypertension, unspecified: Secondary | ICD-10-CM

## 2021-05-18 ENCOUNTER — Telehealth (HOSPITAL_COMMUNITY): Payer: Self-pay | Admitting: Vascular Surgery

## 2021-05-18 NOTE — Telephone Encounter (Signed)
Left pt message to make new pt urgent referral w/ db , asked pt to call back to schedule appt

## 2021-05-23 ENCOUNTER — Encounter (HOSPITAL_COMMUNITY): Payer: Medicare Other | Admitting: Internal Medicine

## 2021-05-27 ENCOUNTER — Encounter (HOSPITAL_COMMUNITY): Payer: Self-pay | Admitting: Internal Medicine

## 2021-05-27 ENCOUNTER — Other Ambulatory Visit: Payer: Self-pay

## 2021-05-27 ENCOUNTER — Ambulatory Visit (HOSPITAL_COMMUNITY)
Admission: RE | Admit: 2021-05-27 | Discharge: 2021-05-27 | Disposition: A | Discharge status: Home / Self Care | Payer: Medicare Other | Source: Ambulatory Visit | Attending: Internal Medicine | Admitting: Internal Medicine

## 2021-05-27 VITALS — BP 130/80 | HR 82 | Wt 193.2 lb

## 2021-05-27 DIAGNOSIS — I38 Endocarditis, valve unspecified: Secondary | ICD-10-CM

## 2021-05-27 DIAGNOSIS — F17210 Nicotine dependence, cigarettes, uncomplicated: Secondary | ICD-10-CM

## 2021-05-27 DIAGNOSIS — R0602 Shortness of breath: Secondary | ICD-10-CM | POA: Diagnosis present

## 2021-05-27 DIAGNOSIS — Z79899 Other long term (current) drug therapy: Secondary | ICD-10-CM | POA: Diagnosis not present

## 2021-05-27 DIAGNOSIS — I110 Hypertensive heart disease with heart failure: Secondary | ICD-10-CM | POA: Diagnosis not present

## 2021-05-27 DIAGNOSIS — E119 Type 2 diabetes mellitus without complications: Secondary | ICD-10-CM | POA: Diagnosis not present

## 2021-05-27 DIAGNOSIS — I272 Pulmonary hypertension, unspecified: Secondary | ICD-10-CM

## 2021-05-27 DIAGNOSIS — R0600 Dyspnea, unspecified: Secondary | ICD-10-CM | POA: Insufficient documentation

## 2021-05-27 DIAGNOSIS — I340 Nonrheumatic mitral (valve) insufficiency: Secondary | ICD-10-CM | POA: Diagnosis not present

## 2021-05-27 DIAGNOSIS — F1410 Cocaine abuse, uncomplicated: Secondary | ICD-10-CM | POA: Diagnosis not present

## 2021-05-27 DIAGNOSIS — J449 Chronic obstructive pulmonary disease, unspecified: Secondary | ICD-10-CM | POA: Insufficient documentation

## 2021-05-27 DIAGNOSIS — I5033 Acute on chronic diastolic (congestive) heart failure: Secondary | ICD-10-CM | POA: Diagnosis not present

## 2021-05-27 DIAGNOSIS — F1110 Opioid abuse, uncomplicated: Secondary | ICD-10-CM | POA: Diagnosis not present

## 2021-05-27 DIAGNOSIS — I5022 Chronic systolic (congestive) heart failure: Secondary | ICD-10-CM | POA: Diagnosis not present

## 2021-05-27 LAB — BRAIN NATRIURETIC PEPTIDE: B Natriuretic Peptide: 928.3 pg/mL — ABNORMAL HIGH (ref 0.0–100.0)

## 2021-05-27 LAB — CBC
HCT: 48.9 % — ABNORMAL HIGH (ref 36.0–46.0)
Hemoglobin: 15.8 g/dL — ABNORMAL HIGH (ref 12.0–15.0)
MCH: 28.6 pg (ref 26.0–34.0)
MCHC: 32.3 g/dL (ref 30.0–36.0)
MCV: 88.4 fL (ref 80.0–100.0)
Platelets: 105 10*3/uL — ABNORMAL LOW (ref 150–400)
RBC: 5.53 MIL/uL — ABNORMAL HIGH (ref 3.87–5.11)
RDW: 16.4 % — ABNORMAL HIGH (ref 11.5–15.5)
WBC: 7.6 10*3/uL (ref 4.0–10.5)
nRBC: 0 % (ref 0.0–0.2)

## 2021-05-27 LAB — COMPREHENSIVE METABOLIC PANEL
ALT: 15 U/L (ref 0–44)
AST: 23 U/L (ref 15–41)
Albumin: 3.9 g/dL (ref 3.5–5.0)
Alkaline Phosphatase: 103 U/L (ref 38–126)
Anion gap: 10 (ref 5–15)
BUN: 20 mg/dL (ref 6–20)
CO2: 24 mmol/L (ref 22–32)
Calcium: 9.3 mg/dL (ref 8.9–10.3)
Chloride: 103 mmol/L (ref 98–111)
Creatinine, Ser: 0.94 mg/dL (ref 0.44–1.00)
GFR, Estimated: >60 mL/min (ref >60)
Glucose, Bld: 252 mg/dL — ABNORMAL HIGH (ref 70–99)
Potassium: 3.3 mmol/L — ABNORMAL LOW (ref 3.5–5.1)
Sodium: 137 mmol/L (ref 135–145)
Total Bilirubin: 1.4 mg/dL — ABNORMAL HIGH (ref 0.3–1.2)
Total Protein: 7.5 g/dL (ref 6.5–8.1)

## 2021-05-27 MED ORDER — SPIRONOLACTONE 25 MG PO TABS: 12.5000 mg | ORAL_TABLET | Freq: Every day | ORAL | 3 refills | Status: AC

## 2021-05-27 MED ORDER — TORSEMIDE 20 MG PO TABS: 40.0000 mg | ORAL_TABLET | Freq: Two times a day (BID) | ORAL | 3 refills | Status: DC

## 2021-05-27 NOTE — Progress Notes (Signed)
ADVANCED HF CLINIC CONSULT NOTE  Referring Physician: Dr. Excell Seltzer, Casimiro Needle Primary Care: Berta Minor Primary Cardiologist: Belva Crome  HPI: 45 yo female with h/o obesity, COPD with ongoing tobacco use, DM2, polysubstance abuse including cocaine and IV,opioid use, HCV, chronic diastolic HF, mitral valve endocarditis with severe MR/moderate MS and severe pulmonary HTN referred by Dr. Excell Seltzer for further evaluation of her PH prior to possible MVR.   She was hospitalized for nearly 2 months at [facility]  in 2015 for acute mitral valve endocarditis 2/2 IVDU  Since that time she has multiple hospitalizations for diastolic CHF. Recently readmitted to [facility] with recurrent HF. ECHO done at [facility] normal EF with severe MR, moderate MS and severe PH.  Diuresed and referred to Dr. Excell Seltzer who arranged for [facility] and TEE. Results below  [facility] 04/29/21: normal coronaries LVEDP = 17 RA  = 23 RV  = 105/10 PA   = 100/40 (59)  PW  = 28  (v 35)  CO 3.25, CI 1.6 PVR = 9.5 WU PAPi = 2.6  Mitral mean gradient 16, peak gradient 10, MVAI 0.32    TEE 04/29/2021: EF 60-65%, normal RV size and function, Mod-Severe MR, likely burnt out vegetation on both leaflets, mild to mod MS, TR mild-mod  Referred here for further evaluation prior to possible MVR.  She is here with her daughter. Continues to smoke ~ 1.5 ppd of cigarettes. Reports being clean from substance use for 2 years.  Gets short of breath just going from the car to the door.  Just bought a scale has not started keeping track of weights.  Feels like she gets resistant to lasix.  Thinks she has extra fluid today mainly centrally and top of thighs along with worsening dyspnea.  Endorses orthopnea, PND as well.  Not checking bp at home. Denies CP  Review of Systems: [y] = yes, [ ]  = no   General: Weight gain [ y]; Weight loss [ ] ; Anorexia [ ] ; Fatigue ]; Fever [ ] ; Chills [ ] ; Weakness [ ]   Cardiac: Chest pain/pressure [ ] ;  Resting SOB [ y]; Exertional SOB [ y]; Orthopnea [ y]; Pedal Edema ]; Palpitations [ ] ; Syncope [ ] ; Presyncope [ ] ; Paroxysmal nocturnal dyspnea[ ]   Pulmonary: Cough ]; Wheezing[ y]; Hemoptysis[ ] ; Sputum [ ] ; Snoring [ ]   GI: Vomiting[ ] ; Dysphagia[ ] ; Melena[ ] ; Hematochezia [ ] ; Heartburn[ ] ; Abdominal pain [ ] ; Constipation [ ] ; Diarrhea [ ] ; BRBPR [ ]   GU: Hematuria[ ] ; Dysuria [ ] ; Nocturia[ ]   Vascular: Pain in legs with walking [ ] ; Pain in feet with lying flat [ ] ; Non-healing sores [ ] ; Stroke [ ] ; TIA [ ] ; Slurred speech [ ] ;  Neuro: Headaches[ ] ; Vertigo[ ] ; Seizures[ ] ; Paresthesias[ ] ;Blurred vision [ ] ; Diplopia [ ] ; Vision changes [ ]   Ortho/Skin: Arthritis [ y]; Joint pain [ y]; Muscle pain [ ] ; Joint swelling [ ] ; Back Pain [ ] ; Rash [ ]   Psych: Depression[ y]; Anxiety[y ]  Heme: Bleeding problems [ ] ; Clotting disorders [ ] ; Anemia [ ]   Endocrine: Diabetes [place] ]; Thyroid dysfunction[ ]    Past Medical History:  Diagnosis Date   Abdominal ascites 03/30/2021   Acute kidney injury (HCC) 03/30/2021   Acute respiratory failure with hypoxia and hypercapnia (HCC) 04/23/2016   Alcohol intoxication (HCC) 04/23/2016   Allergy    Anasarca 03/30/2021   Arthropathy    Asthma exacerbation 04/23/2016   Bacteremia due to  methicillin susceptible Staphylococcus aureus (MSSA) 03/30/2021   Bipolar 1 disorder (HCC)    Cervical radicular pain 12/27/2012   Chronic airway obstruction (HCC)    Chronic obstructive pulmonary disease, unspecified (HCC) 05/29/2014   Chronic pain syndrome 12/27/2012   Chronic viral hepatitis C (HCC) 05/05/2016   Confusion 06/28/2014   CVA (cerebral vascular accident) (HCC) 03/30/2021   Cyst of left ovary 01/20/2016   Depression    Diabetes mellitus without complication (HCC)    Diabetic foot ulcer (HCC) 03/30/2021   Drug overdose 04/23/2016   Dysuria 03/30/2021   Endocarditis    Mitral valve, at [facility] 2015   Endocarditis due to methicillin susceptible Staphylococcus aureus  (MSSA) 03/30/2021   Endocarditis of mitral valve 06/08/2014   Esophageal reflux    Essential hypertension 05/29/2014   Fluid retention 12/11/2019   Heart failure, acute diastolic (HCC) 03/30/2021   Heart failure, unspecified (HCC) 03/30/2021   Hepatitis    History of endocarditis 03/30/2021   History of intravenous drug abuse (HCC) 01/01/2020   History of stroke 05/29/2014   Hyperlipidemia    Hypertension    Hypomenorrhea/oligomenorrhea 01/20/2016   Hyponatremia 03/30/2021   IVDU (intravenous drug user) 04/23/2016   Low back pain, unspecified 12/27/2012   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   Lung mass 03/30/2021   Major depressive disorder, recurrent, in remission, unspecified (HCC) 06/12/2014   Mitral regurgitation 05/05/2016   Nauseated 03/30/2021   Neck pain 12/27/2012   Old cerebrovascular accident (CVA) without late effect 11/05/2018   Opioid abuse (HCC)    Osteomyelitis of toe of left foot (HCC) 06/08/2014   Overdose 04/23/2016   Pneumonia 03/30/2021   Polyneuropathy    Tinea cruris 03/30/2021   Tobacco abuse 06/30/2014   Transaminitis 04/23/2016   Unspecified visual loss 06/28/2014    Current Outpatient Medications  Medication Sig Dispense Refill   cariprazine (VRAYLAR) 1.5 MG capsule Take 1.5 mg by mouth in the morning.     Cholecalciferol (VITAMIN D3) 125 MCG (5000 UT) TABS Take 5,000 Units by mouth daily.     famotidine (PEPCID) 40 MG tablet Take 40 mg by mouth in the morning.     nicotine (NICODERM CQ - DOSED IN MG/24 HOURS) 21 mg/24hr patch Place 21 mg onto the skin daily.     pregabalin (LYRICA) 150 MG capsule Take 150 mg by mouth 3 (three) times daily.     Semaglutide (RYBELSUS) 3 MG TABS Take 3 mg by mouth in the morning.     spironolactone (ALDACTONE) 25 MG tablet Take 0.5 tablets (12.5 mg total) by mouth daily. 15 tablet 3   SUBOXONE 8-2 MG FILM Take 1 Film by mouth 2 (two) times daily.     torsemide (DEMADEX) 20 MG tablet Take 2 tablets (40 mg total) by mouth 2 (two) times daily. 120  tablet 3   VENTOLIN HFA 108 (90 Base) MCG/ACT inhaler Inhale 1-2 puffs into the lungs every 6 (six) hours as needed for shortness of breath.     vitamin B-12 (CYANOCOBALAMIN) 500 MCG tablet Take 500 mcg by mouth daily.     No current facility-administered medications for this encounter.    Allergies  Allergen Reactions   Amoxicillin Anaphylaxis   Keflex [Cephalexin] Itching and Rash   Penicillins Rash    Has patient had a PCN reaction causing immediate rash, facial/tongue/throat swelling, SOB or lightheadedness with hypotension:Yes Has patient had a PCN reaction causing severe rash involving mucus membranes or skin necrosis:no Has patient had a PCN reaction that required hospitalization:unsure  Has patient had a PCN reaction occurring within the last 10 years:No If all of the above answers are "NO", then may proceed with Cephalosporin use.       Social History   Socioeconomic History   Marital status: Single    Spouse name: Not on file   Number of children: Not on file   Years of education: Not on file   Highest education level: Not on file  Occupational History   Not on file  Tobacco Use   Smoking status: Every Day   Smokeless tobacco: Never  Vaping Use   Vaping Use: Every day  Substance and Sexual Activity   Alcohol use: Yes   Drug use: Yes    Types: Cocaine, Heroin    Comment: heroin tonight   Sexual activity: Not on file  Other Topics Concern   Not on file  Social History Narrative   Not on file   Social Determinants of Health   Financial Resource Strain: Not on file  Food Insecurity: Not on file  Transportation Needs: Not on file  Physical Activity: Not on file  Stress: Not on file  Social Connections: Not on file  Intimate Partner Violence: Not on file      Family History  Problem Relation Age of Onset   ADD / ADHD Other    Alcohol abuse Other    Stroke Other    Diabetes Other    Hypertension Other     Vitals:   05/27/21 0905  BP: 130/80   Pulse: 82  SpO2: 96%  Weight: 87.6 kg (193 lb 3.2 oz)    PHYSICAL EXAM: General:  Obese chronically ill appearing HEENT: normal Neck: supple. JVD to ear . Carotids 2+ bilat; no bruits. No lymphadenopathy or thryomegaly appreciated. Cor: PMI nondisplaced. Regular rate & rhythm. 3/6 MR. Lungs: decreased BS throughout bilateral expiratory wheezing, no rales Abdomen: obese soft, nontender, + distended. No hepatosplenomegaly. No bruits or masses. Good bowel sounds. Extremities: no cyanosis, clubbing, rash, 2-3+ woody LE edema bilaterally Neuro: alert & oriented x 3, cranial nerves grossly intact. moves all 4 extremities w/o difficulty. Affect pleasant.  ECG: NSR rate 70 LPFB non-specific diffuse ST depression Personally reviewed  ReDS: 37%   ASSESSMENT & PLAN:  1. Acute on Chronic Diastolic HF due to severe mitral valve disease -TEE 04/29/2021 EF 60-65%, normal RV size and function, Severe MR,  Mod MS likely burnt out vegetation on both leaflets, TR mild-mod - NYHA IIIb with R>L HF - she is markedly volume overloaded - switch lasix to torsemide 40 BID - start spironolactone 12.5 daily - Check labs today and in 1 week  2. Pulmonary HTN - multifactorial, based on current workup primarily group 2 and 3  - I walked her around the office today and sats remained 92-95% - She will need optimization of her inhaler regimen as well as a sleep study - I have called Dr. Blenda Nicely (her mother's pulmonologist) who will see her this week - Stressed need to stop smoking  3. Mitral valve disease, severe -TEE 04/29/2021 EF 60-65%, normal RV size and function, Mod MS/Severe MR, likely burnt out vegetation on both leaflets, TR mild-mod - Ideally she will need to valve replaced but currently not a surgical candidate - Once volume status and pulmonary status optimized will repeat RHC and refer to Dr. Laneta Simmers CT surgery to discuss possible surgical repair options - If PA pressures remain elevated on RHC would  consider trial of milrinone +/- NO to optimize  in house - With markedly elevated PCW on recent cath will not start selective pulmonary artery vasodilators at this time  4. COPD with ongoing tobacco use - refer to Dr. Derrell Lolling as above - stressed need to quit   5. Opioid Use Disorder -sees Dr. Nedra Hai in [place] who prescribes suboxone -has not used IV opiates or cocaine in 2 years per her report  6. HCV -needs ID follow up eventually  Total time spent 45 minutes. Over half that time spent discussing above.   Arvilla Meres, MD  11:10 PM

## 2021-05-27 NOTE — Progress Notes (Signed)
ReDS Vest / Clip - 05/27/21 1000       ReDS Vest / Clip   Station Marker D    Ruler Value 37    ReDS Value Range Moderate volume overload    ReDS Actual Value 37

## 2021-05-27 NOTE — Patient Instructions (Signed)
Stop Furosemide  Start Torsemide 40 mg (2 tabs) Twice daily   Start Spironolactone 12.5 mg (1/2 tab) Daily  Labs done today, we will call you for abnormal results  Your physician recommends that you return for lab work in: 1 week, we have provided you with a prescription to have this done locally  Please wear your compression hose daily, place them on as soon as you get up in the morning and remove before you go to bed at night.  You have been referred to Dr Laneta Simmers for heart valve surgery, his office will call you for an appointment  You have been referred to Dr Blenda Nicely, pulmonary in [place], his office will call you for an appointment  Your physician discussed the hazards of tobacco use. Tobacco use cessation is recommended and techniques and options to help you quit were discussed.  Your physician recommends that you schedule a follow-up appointment in: 6 weeks  If you have any questions or concerns before your next appointment please send [place] a message through [place] or call our office at [phone].    TO LEAVE A MESSAGE FOR THE NURSE SELECT OPTION 2, PLEASE LEAVE A MESSAGE INCLUDING: YOUR NAME DATE OF BIRTH CALL BACK NUMBER REASON FOR CALL**this is important as we prioritize the call backs  YOU WILL RECEIVE A CALL BACK THE SAME DAY AS LONG AS YOU CALL BEFORE 4:00 PM  At the [facility], you and your health needs are our priority. As part of our continuing mission to provide you with exceptional heart care, we have created designated Provider Care Teams. These Care Teams include your primary Cardiologist (physician) and Advanced Practice Providers (APPs- Physician Assistants and Nurse Practitioners) who all work together to provide you with the care you need, when you need it.   You may see any of the following providers on your designated Care Team at your next follow up: Dr Arvilla Meres Dr Marca Ancona Dr Brandon Melnick, NP Robbie Lis, [place] Mikki Santee Karle Plumber, PharmD   Please be sure to bring in all your medications bottles to every appointment.

## 2021-06-23 ENCOUNTER — Telehealth (HOSPITAL_COMMUNITY): Payer: Self-pay | Admitting: *Deleted

## 2021-06-23 ENCOUNTER — Encounter (HOSPITAL_COMMUNITY): Payer: Self-pay | Admitting: *Deleted

## 2021-06-23 DIAGNOSIS — I38 Endocarditis, valve unspecified: Secondary | ICD-10-CM

## 2021-06-23 DIAGNOSIS — I5022 Chronic systolic (congestive) heart failure: Secondary | ICD-10-CM

## 2021-06-23 MED ORDER — POTASSIUM CHLORIDE CRYS ER 20 MEQ PO TBCR: 40.0000 meq | EXTENDED_RELEASE_TABLET | Freq: Every day | ORAL | 3 refills | Status: DC

## 2021-06-23 NOTE — Telephone Encounter (Signed)
Have been unable to reach pt, multiple messages left for pt to call back. Letter mailed advising K low needs to start KCL and rx sent to pharmacy   Noralee Space, RN  05/31/2021  5:11 PM EDT     Bensimhon, Bevelyn Buckles, MD  Noralee Space, RN Add kdur 40 daily. Take 2 the first day.    Repeat BMET 1 week      Left message to call back

## 2021-06-29 ENCOUNTER — Other Ambulatory Visit: Payer: Self-pay

## 2021-06-29 ENCOUNTER — Institutional Professional Consult (permissible substitution) (INDEPENDENT_AMBULATORY_CARE_PROVIDER_SITE_OTHER): Payer: Medicare Other | Admitting: Surgery

## 2021-06-29 ENCOUNTER — Other Ambulatory Visit: Payer: Self-pay | Admitting: *Deleted

## 2021-06-29 ENCOUNTER — Encounter: Payer: Self-pay | Admitting: Surgery

## 2021-06-29 VITALS — BP 115/72 | HR 72 | Resp 20 | Ht 67.0 in | Wt 191.0 lb

## 2021-06-29 DIAGNOSIS — I340 Nonrheumatic mitral (valve) insufficiency: Secondary | ICD-10-CM | POA: Diagnosis not present

## 2021-06-29 NOTE — Progress Notes (Signed)
Patient ID: Emily Larson, female   DOB: September 02, 1975, 45 y.o.   MRN: [id number]        [address]       [place] [address]             [phone]          CARDIOTHORACIC SURGERY CONSULTATION REPORT  PCP is Darrol Jump, NP Referring Provider is Sherren Mocha, MD Primary Cardiologist is Jyl Heinz, MD  Reason for consultation: Mitral valve endocarditis with severe mitral regurgitation and moderate mitral stenosis  HPI:  The patient is a 45 year old woman with a history of type 2 diabetes, polysubstance abuse including cocaine and IV opioids, chronic diastolic heart failure, and mitral valve endocarditis with severe mitral regurgitation and moderate mitral stenosis as well as severe pulmonary hypertension.  She was hospitalized for almost 2 months at [facility] in 2015 for acute mitral valve endocarditis due to IV drug use.  Since that time she has had multiple hospitalizations for acute on chronic diastolic congestive heart failure and was recently readmitted to [facility] with recurrent heart failure.  An echocardiogram done at [facility] reportedly showed severe mitral regurgitation and moderate mitral stenosis with severe pulmonary hypertension.  She was diuresed and referred to Dr. Burt Knack.  She underwent a TEE on 04/29/2021 showing a calcified mitral valve with restricted motion.  There is severe mitral regurgitation and mild to moderate mitral stenosis.  It was felt this was likely due to old vegetation on both leaflets but particularly the anterior leaflet.  The peak mitral valve gradient was 8.6 mmHg with a mean gradient of 4 mmHg.  There is mild to moderate tricuspid regurgitation.  There is no aortic stenosis or insufficiency.  There is mild pulmonary valve regurgitation.  Right ventricular systolic function was normal.  Left ventricular ejection fraction was 60 to 65%. She subsequently underwent cardiac catheterization on 04/29/2021 showing normal  coronary arteries.  LVEDP was mildly elevated at 17 mmHg but there was severe pulmonary hypertension at 100/40 which was equal to systemic arterial pressure.  Mean pulmonary wedge pressure is 28 with a V wave of 35.  Mean right atrial pressure was 21 mmHg.  Cardiac index was low at 1.6.  The mean mitral valve gradient was calculated at 16.38 mmHg with a peak gradient of 10 mmHg.  The patient is here today with her daughter.  She continues to smoke but said that she cut down to about 6 to 8 cigarettes/day.  She denies any IV drug use for the past 2 years.  She said that she is getting short of breath from walking short distances such as from her car to the door.  She reports orthopnea and PND.  She has had no chest pain or pressure.  She does have some chest tightness.  She has had dizzy spells.  Past Medical History:  Diagnosis Date   Abdominal ascites 03/30/2021   Acute kidney injury ([place]) 03/30/2021   Acute respiratory failure with hypoxia and hypercapnia ([place]) 04/23/2016   Alcohol intoxication ([place]) 04/23/2016   Allergy    Anasarca 03/30/2021   Arthropathy    Asthma exacerbation 04/23/2016   Bacteremia due to methicillin susceptible Staphylococcus aureus (MSSA) 03/30/2021   Bipolar 1 disorder (HCC)    Cervical radicular pain 12/27/2012   Chronic airway obstruction (HCC)    Chronic obstructive pulmonary disease, unspecified ([place]) 05/29/2014   Chronic pain syndrome 12/27/2012   Chronic viral hepatitis C ([place]) 05/05/2016   Confusion 06/28/2014  CVA (cerebral vascular accident) ([place]) 03/30/2021   Cyst of left ovary 01/20/2016   Depression    Diabetes mellitus without complication ([place])    Diabetic foot ulcer ([place]) 03/30/2021   Drug overdose 04/23/2016   Dysuria 03/30/2021   Endocarditis    Mitral valve, at [facility] 2015   Endocarditis due to methicillin susceptible Staphylococcus aureus (MSSA) 03/30/2021   Endocarditis of mitral valve 06/08/2014   Esophageal reflux    Essential hypertension 05/29/2014   Fluid  retention 12/11/2019   Heart failure, acute diastolic ([place]) [id number]   Heart failure, unspecified ([place]) 03/30/2021   Hepatitis    History of endocarditis 03/30/2021   History of intravenous drug abuse ([place]) 01/01/2020   History of stroke 05/29/2014   Hyperlipidemia    Hypertension    Hypomenorrhea/oligomenorrhea 01/20/2016   Hyponatremia 03/30/2021   IVDU (intravenous drug user) 04/23/2016   Low back pain, unspecified 12/27/2012   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   Lung mass 03/30/2021   Major depressive disorder, recurrent, in remission, unspecified ([place]) 06/12/2014   Mitral regurgitation 05/05/2016   Nauseated 03/30/2021   Neck pain 12/27/2012   Old cerebrovascular accident (CVA) without late effect 11/05/2018   Opioid abuse ([place])    Osteomyelitis of toe of left foot ([place]) 06/08/2014   Overdose 04/23/2016   Pneumonia 03/30/2021   Polyneuropathy    Tinea cruris 03/30/2021   Tobacco abuse 06/30/2014   Transaminitis 04/23/2016   Unspecified visual loss 06/28/2014    Past Surgical History:  Procedure Laterality Date   adnoidectomy     CESAREAN SECTION     RIGHT/LEFT HEART CATH AND CORONARY ANGIOGRAPHY N/A 04/29/2021   Procedure: RIGHT/LEFT HEART CATH AND CORONARY ANGIOGRAPHY;  Surgeon: Wellington Hampshire, MD;  Location: [place] [facility];  Service: Cardiovascular;  Laterality: N/A;   TEE WITHOUT CARDIOVERSION N/A 04/29/2021   Procedure: TRANSESOPHAGEAL ECHOCARDIOGRAM (TEE);  Surgeon: Josue Hector, MD;  Location: [facility] ENDOSCOPY;  Service: Cardiovascular;  Laterality: N/A;  CATH AFTER PROCEDURE   TONSILLECTOMY      Family History  Problem Relation Age of Onset   ADD / ADHD Other    Alcohol abuse Other    Stroke Other    Diabetes Other    Hypertension Other     Social History   Socioeconomic History   Marital status: Single    Spouse name: Not on file   Number of children: Not on file   Years of education: Not on file   Highest education level: Not on file  Occupational History   Not on  file  Tobacco Use   Smoking status: Every Day   Smokeless tobacco: Never  Vaping Use   Vaping Use: Every day  Substance and Sexual Activity   Alcohol use: Yes   Drug use: Yes    Types: Cocaine, Heroin    Comment: heroin tonight   Sexual activity: Not on file  Other Topics Concern   Not on file  Social History Narrative   Not on file   Social Determinants of Health   Financial Resource Strain: Not on file  Food Insecurity: Not on file  Transportation Needs: Not on file  Physical Activity: Not on file  Stress: Not on file  Social Connections: Not on file  Intimate Partner Violence: Not on file    Prior to Admission medications   Medication Sig Start Date End Date Taking? Authorizing Provider  cariprazine (VRAYLAR) 1.5 MG capsule Take 1.5 mg by mouth in the morning. 01/26/21  Yes [provider]  Cholecalciferol (VITAMIN D3) 125 MCG (5000 UT) TABS Take 5,000 Units by mouth daily.   Yes [provider]  famotidine (PEPCID) 40 MG tablet Take 40 mg by mouth in the morning. 01/26/21  Yes [provider]  nicotine (NICODERM CQ - DOSED IN MG/24 HOURS) 21 mg/24hr patch Place 21 mg onto the skin daily.   Yes [provider]  potassium chloride SA (KLOR-CON) 20 MEQ tablet Take 2 tablets (40 mEq total) by mouth daily. 06/23/21  Yes Bensimhon, Shaune Pascal, MD  pregabalin (LYRICA) 150 MG capsule Take 150 mg by mouth 3 (three) times daily. 01/26/21  Yes [provider]  Semaglutide (RYBELSUS) 3 MG TABS Take 3 mg by mouth in the morning. 01/26/21  Yes [provider]  spironolactone (ALDACTONE) 25 MG tablet Take 0.5 tablets (12.5 mg total) by mouth daily. 05/27/21  Yes Bensimhon, Shaune Pascal, MD  SUBOXONE 8-2 MG FILM Take 1 Film by mouth 2 (two) times daily. 03/31/16  Yes [provider]  torsemide (DEMADEX) 20 MG tablet Take 2 tablets (40 mg total) by mouth 2 (two) times daily. 05/27/21  Yes Bensimhon, Shaune Pascal, MD  VENTOLIN HFA 108 (90 Base)  MCG/ACT inhaler Inhale 1-2 puffs into the lungs every 6 (six) hours as needed for shortness of breath. 01/16/16  Yes [provider]  vitamin B-12 (CYANOCOBALAMIN) 500 MCG tablet Take 500 mcg by mouth daily.   Yes [provider]    Current Outpatient Medications  Medication Sig Dispense Refill   cariprazine (VRAYLAR) 1.5 MG capsule Take 1.5 mg by mouth in the morning.     Cholecalciferol (VITAMIN D3) 125 MCG (5000 UT) TABS Take 5,000 Units by mouth daily.     famotidine (PEPCID) 40 MG tablet Take 40 mg by mouth in the morning.     nicotine (NICODERM CQ - DOSED IN MG/24 HOURS) 21 mg/24hr patch Place 21 mg onto the skin daily.     potassium chloride SA (KLOR-CON) 20 MEQ tablet Take 2 tablets (40 mEq total) by mouth daily. 60 tablet 3   pregabalin (LYRICA) 150 MG capsule Take 150 mg by mouth 3 (three) times daily.     Semaglutide (RYBELSUS) 3 MG TABS Take 3 mg by mouth in the morning.     spironolactone (ALDACTONE) 25 MG tablet Take 0.5 tablets (12.5 mg total) by mouth daily. 15 tablet 3   SUBOXONE 8-2 MG FILM Take 1 Film by mouth 2 (two) times daily.     torsemide (DEMADEX) 20 MG tablet Take 2 tablets (40 mg total) by mouth 2 (two) times daily. 120 tablet 3   VENTOLIN HFA 108 (90 Base) MCG/ACT inhaler Inhale 1-2 puffs into the lungs every 6 (six) hours as needed for shortness of breath.     vitamin B-12 (CYANOCOBALAMIN) 500 MCG tablet Take 500 mcg by mouth daily.     No current facility-administered medications for this visit.    Allergies  Allergen Reactions   Amoxicillin Anaphylaxis   Keflex [Cephalexin] Itching and Rash   Penicillins Rash    Has patient had a PCN reaction causing immediate rash, facial/tongue/throat swelling, SOB or lightheadedness with hypotension:Yes Has patient had a PCN reaction causing severe rash involving mucus membranes or skin necrosis:no Has patient had a PCN reaction that required hospitalization:unsure Has patient had a PCN reaction  occurring within the last 10 years:No If all of the above answers are "NO", then may proceed with Cephalosporin use.       Review  of Systems:   General:  normal appetite, + decreased energy, + weight gain, no weight loss, no fever  Cardiac:  no chest pain with exertion, + chest tightness at rest, +SOB with mild exertion, no resting SOB, + PND, + orthopnea, no palpitations, no arrhythmia, no atrial fibrillation, + LE edema, + dizzy spells, no syncope  Respiratory:  + shortness of breath, no home oxygen, + productive cough, + dry cough, no bronchitis, + wheezing, no hemoptysis, + asthma, + pain with inspiration or cough, no sleep apnea, no CPAP at night  GI:   no difficulty swallowing, + reflux, no frequent heartburn, no hiatal hernia, no abdominal pain, + constipation, no diarrhea, no hematochezia, no hematemesis, no melena  GU:   no dysuria,  no frequency, no urinary tract infection, no hematuria, no kidney stones, no kidney disease  Vascular:  no pain suggestive of claudication, no pain in feet, + leg cramps, + varicose veins, no DVT, no non-healing foot ulcer  Neuro:   + stroke, no TIA's, no seizures, + headaches, no temporary blindness one eye,  no slurred speech, + peripheral neuropathy, + chronic pain, no instability of gait, + memory/cognitive dysfunction  Musculoskeletal: + arthritis, no joint swelling, + myalgias, no difficulty walking, normal mobility   Skin:   no rash, + itching, no skin infections, no pressure sores or ulcerations  Psych:   + anxiety, + depression, + nervousness, no unusual recent stress  Eyes:   + blurry vision, + floaters, no recent vision changes, + wears glasses or contacts  ENT:   no hearing loss, no loose or painful teeth, no dentures, has not seen a dentist in a long time.  Hematologic:  no easy bruising, no abnormal bleeding, no clotting disorder, no frequent epistaxis  Endocrine:  + diabetes, does not check CBG's at home     Physical Exam:   BP 115/72    Pulse 72   Resp 20   Ht 5\' 7"  (1.702 m)   Wt 191 lb (86.6 kg)   SpO2 94% Comment: RA  BMI 29.91 kg/m   General:  well-appearing  HEENT:  Unremarkable, NCAT, PERLA, EOMI  Neck:   no JVD, no bruits, no adenopathy   Chest:   clear to auscultation, symmetrical breath sounds, no wheezes, no rhonchi   CV:   RRR, no murmur   Abdomen:  soft, non-tender, no masses   Extremities:  warm, well-perfused, pulses palpable at ankle, + mild lower extremity edema  Rectal/GU  Deferred  Neuro:   Grossly non-focal and symmetrical throughout  Skin:   Clean and dry, no rashes, no breakdown  Diagnostic Tests:  TRANSESOPHOGEAL ECHO REPORT         Patient Name:   [facility] Date of Exam: 04/29/2021  Medical Rec #:  [id number]  Height:       67.0 in  Accession #:    [id number] Weight:       203.0 lb  Date of Birth:  02/24/1976  BSA:          2.035 m  Patient Age:    77 years   BP:           90/60 mmHg  Patient Gender: F          HR:           77 bpm.  Exam Location:  Inpatient   Procedure: Transesophageal Echo   Indications:    SBE mitral valve diseaease     History:  Patient has prior history of Echocardiogram examinations.  CHF;                  Mitral Valve Disease.     Sonographer:    Merrie Roof RDCS  Referring Phys: [phone] MICHAEL COOPER   PROCEDURE: The transesophogeal probe was passed without difficulty through  the esophogus of the patient. Sedation performed by different physician.  The patient developed no complications during the procedure.   IMPRESSIONS     1. Left ventricular ejection fraction, by estimation, is 60 to 65%. The  left ventricle has normal function.   2. Right ventricular systolic function is normal. The right ventricular  size is normal.   3. Left atrial size was severely dilated. No left atrial/left atrial  appendage thrombus was detected.   4. Right atrial size was mildly dilated.   5. Calcified with restricted motion mild/moderate MS Moderate to severe   MR      Likely burnt out vegetation on both leaflets but particularly the  anterior      Valve is not repairable and needs to be replaced . The mitral valve is  abnormal. Moderate to severe mitral valve regurgitation. Mild to moderate  mitral stenosis.   6. Tricuspid valve regurgitation is mild to moderate.   7. The aortic valve is normal in structure. Aortic valve regurgitation is  not visualized. No aortic stenosis is present.   FINDINGS   Left Ventricle: Left ventricular ejection fraction, by estimation, is 60  to 65%. The left ventricle has normal function. The left ventricular  internal cavity size was normal in size.   Right Ventricle: The right ventricular size is normal. No increase in  right ventricular Prew thickness. Right ventricular systolic function is  normal.   Left Atrium: Left atrial size was severely dilated. No left atrial/left  atrial appendage thrombus was detected.   Right Atrium: Right atrial size was mildly dilated.   Pericardium: There is no evidence of pericardial effusion.   Mitral Valve: Calcified with restricted motion mild/moderate MS Moderate  to severe MR  Likely burnt out vegetation on both leaflets but particularly the anterior  Valve is not repairable and needs to be replaced. The mitral valve is  abnormal. Moderate to severe mitral valve regurgitation. Mild to moderate  mitral valve stenosis. MV peak gradient, 8.6 mmHg. The mean mitral valve  gradient is 4.0 mmHg.   Tricuspid Valve: The tricuspid valve is normal in structure. Tricuspid  valve regurgitation is mild to moderate.   Aortic Valve: The aortic valve is normal in structure. Aortic valve  regurgitation is not visualized. No aortic stenosis is present.   Pulmonic Valve: The pulmonic valve was normal in structure. Pulmonic valve  regurgitation is mild.   Aorta: The aortic root is normal in size and structure.   IAS/Shunts: No atrial level shunt detected by color flow Doppler.       MITRAL VALVE  MV Area (PHT): 2.75 cm  MV Peak grad:  8.6 mmHg  MV Mean grad:  4.0 mmHg  MV Vmax:       1.47 m/s  MV Vmean:      88.2 cm/s  MR Peak grad:    39.7 mmHg  MR Mean grad:    27.0 mmHg  MR Vmax:         315.00 cm/s  MR Vmean:        241.0 cm/s  MR PISA:         6.28 cm  MR PISA Eff ROA: 65 mm  MR PISA Radius:  1.00 cm   Charlton HawsPeter Nishan MD  Electronically signed by Charlton HawsPeter Nishan MD  Signature Date/Time: 04/29/2021/9:49:43 AM         Final     Physicians Panel Physicians Referring Physician Case Authorizing Physician  Iran OuchArida, Muhammad A, MD (Primary)    Procedures RIGHT/LEFT HEART CATH AND CORONARY ANGIOGRAPHY  Conclusion 1. Normal coronary arteries.  2. Right heart catheterization showed severely elevated right atrial pressure, severe pulmonary hypertension and severely decreased cardiac output. LVEDP was only mildly elevated at 17 mmHg.  3. There is evidence of severe mitral stenosis with mean gradient of 16 mmHg and valve area of 0.65.  Recommendations:  The patient will need to be evaluated for mitral valve replacement but it will be very high risk given degree of pulmonary hypertension and severely reduced cardiac output.  Will refer the patient to the advanced heart failure clinic. Avoid overdiuresis. Volume status appears to be optimal.  Surgeon Notes   04/29/2021 9:42 AM CV Procedure signed by Wendall StadeNishan, Peter C, MD  Indications Mitral valve disease [I05.9 (ICD-10-CM)]  Procedural Details Technical Details Procedural Details: The pre-existing IV in the right antecubital vein was exchanged under sterile fashion to a slender sheath. The right wrist was prepped, draped, and anesthetized with 1% lidocaine. Using the modified Seldinger technique, a 5 French sheath was introduced into the right radial artery. 3 mg of verapamil was administered through the sheath, weight-based unfractionated heparin was administered intravenously. Right heart catheterization was  performed using a 5 French Swan-Ganz catheter. Cardiac output was calculated by the Fick method. A Jackie catheter was used for selective coronary angiography. A JL 3.5 catheter was needed to engage the left main coronary artery. A pigtail catheter was used for LV pressure with pullback and simultaneous pressure recording of LV and wedge pressure. Catheter exchanges were performed over an exchange length guidewire. There were no immediate procedural complications. A TR band was used for radial hemostasis at the completion of the procedure. The patient was transferred to the post catheterization recovery area for further monitoring.    Estimated blood loss <50 mL.   During this procedure medications were administered to achieve and maintain moderate conscious sedation while the patient's heart rate, blood pressure, and oxygen saturation were continuously monitored and I was present face-to-face 100% of this time.  Medications (Filter: Administrations occurring from 0950 to 1046 on 04/29/21)  fentaNYL (SUBLIMAZE) injection (mcg) Total dose: 25 mcg  Date/Time Rate/Dose/Volume Action    04/29/21 1003 25 mcg Given   midazolam (VERSED) injection (mg) Total dose: 2 mg  Date/Time Rate/Dose/Volume Action    04/29/21 1003 1 mg Given   1032 1 mg Given   lidocaine (PF) (XYLOCAINE) 1 % injection (mL) Total volume: 4 mL  Date/Time Rate/Dose/Volume Action    04/29/21 1008 2 mL Given   1010 2 mL Given   Heparin (Porcine) in NaCl 1000-0.9 UT/500ML-% SOLN (mL) Total volume: 1,000 mL  Date/Time Rate/Dose/Volume Action    04/29/21 1009 500 mL Given   1009 500 mL Given   Radial Cocktail/Verapamil only (mL) Total volume: 20 mL  Date/Time Rate/Dose/Volume Action    04/29/21 1013 10 mL Given   1031 10 mL Given   heparin sodium (porcine) injection (Units) Total dose: 4,500 Units  Date/Time Rate/Dose/Volume Action    04/29/21 1021 4,500 Units Given   iohexol (OMNIPAQUE) 350 MG/ML injection  (mL) Total volume: 65 mL  Date/Time Rate/Dose/Volume Action  04/29/21 1040 65 mL Given   Sedation Time Sedation Time Physician-1: 35 minutes 19 seconds  Contrast Medication Name Total Dose  iohexol (OMNIPAQUE) 350 MG/ML injection 65 mL  Radiation/Fluoro Fluoro time: 6.9 (min)  DAP: 11626 (mGycm2)  Cumulative Air Kerma: 220 (mGy)  Coronary Findings Diagnostic Dominance: Right  Left Main  Vessel is angiographically normal.   Left Circumflex  Vessel is angiographically normal.   Right Coronary Artery  Vessel is angiographically normal.   Right Posterior Descending Artery  Vessel is angiographically normal.   First Right Posterolateral Branch  Vessel is angiographically normal.  Intervention No interventions have been documented.  Coronary Diagrams Diagnostic Dominance: Right  &&&&&&  Intervention Implants  No implant documentation for this case.   Syngo Images Show images for CARDIAC CATHETERIZATION  Images on Long Term Storage Show images for Dise, October Link to Procedure Log   Procedure Log  Hemo Data Flowsheet Row Most Recent Value  Fick Cardiac Output 3.25 L/min  Fick Cardiac Output Index 1.6 (L/min)/BSA  Mitral Mean Gradient 16.38 mmHg  Mitral Peak Gradient 10 mmHg  Mitral Valve Area Index 0.32 cm2/BSA  RA A Wave 23 mmHg  RA V Wave 23 mmHg  RA Mean 21 mmHg  RV Systolic Pressure 105 mmHg  RV Diastolic Pressure 10 mmHg  RV EDP 25 mmHg  PA Systolic Pressure 100 mmHg  PA Diastolic Pressure 40 mmHg  PA Mean 59 mmHg  PW A Wave 29 mmHg  PW V Wave 35 mmHg  PW Mean 28 mmHg  AO Systolic Pressure 92 mmHg  AO Diastolic Pressure 57 mmHg  AO Mean 73 mmHg  LV Systolic Pressure 94 mmHg  LV Diastolic Pressure 9 mmHg  LV EDP 20 mmHg  AOp Systolic Pressure 90 mmHg  AOp Diastolic Pressure 52 mmHg  AOp Mean Pressure 68 mmHg  LVp Systolic Pressure 87 mmHg  LVp Diastolic Pressure 7 mmHg  LVp EDP Pressure 18 mmHg  QP/QS 1  TPVR Index 36.92 HRUI  TSVR Index  45.69 HRUI  PVR SVR Ratio 0.65  TPVR/TSVR Ratio 0.81     Impression:  This 45 year old woman has severe mitral regurgitation and moderate mitral stenosis status post mitral valve endocarditis in 2015 with New [place] Heart Association class IIIb symptoms of right greater than left heart failure and recurrent admissions for acute on chronic diastolic heart failure.  She has systemic pulmonary artery pressures on her cardiac catheterization with low cardiac output and I do not think that she would be an operative candidate unless these can be brought down.  I also think she needs to completely discontinue smoking.  She has assured me that she has not used IV drugs in the past 2 years.  She will require admission for right heart catheterization and inpatient tune up under the direction of Dr. Gala Romney to decide if she will be an operative candidate.  She will also require dental evaluation preoperatively.  I reviewed the TEE and cath images with her and her daughter and discussed all the complexities involved in making a decision about surgery.  All of her questions have been answered and she is in agreement to proceed with further work-up.  Plan:  I will coordinate her further preoperative work-up with Dr. Gala Romney and we will arrange dental evaluation.  I spent 60 minutes performing this consultation and > 50% of this time was spent face to face counseling and coordinating the care of this patient's severe mitral regurgitation and moderate mitral stenosis with severe pulmonary hypertension.  Gaye Pollack, MD 06/29/2021 12:41 PM

## 2021-07-08 ENCOUNTER — Ambulatory Visit (HOSPITAL_COMMUNITY)
Admission: RE | Admit: 2021-07-08 | Discharge: 2021-07-08 | Disposition: A | Discharge status: Home / Self Care | Payer: Medicare Other | Source: Ambulatory Visit | Attending: Surgery | Admitting: Surgery

## 2021-07-08 ENCOUNTER — Other Ambulatory Visit: Payer: Self-pay

## 2021-07-08 DIAGNOSIS — F17210 Nicotine dependence, cigarettes, uncomplicated: Secondary | ICD-10-CM | POA: Diagnosis not present

## 2021-07-08 DIAGNOSIS — I340 Nonrheumatic mitral (valve) insufficiency: Secondary | ICD-10-CM | POA: Diagnosis present

## 2021-07-08 DIAGNOSIS — R0600 Dyspnea, unspecified: Secondary | ICD-10-CM | POA: Diagnosis not present

## 2021-07-08 LAB — PULMONARY FUNCTION TEST
DL/VA % pred: 99 %
DL/VA: 4.29 ml/min/mmHg/L
DLCO unc % pred: 68 %
DLCO unc: 16.4 ml/min/mmHg
FEF 25-75 Post: 1.22 L/sec
FEF 25-75 Pre: 1.03 L/sec
FEF2575-%Change-Post: 17 %
FEF2575-%Pred-Post: 37 %
FEF2575-%Pred-Pre: 31 %
FEV1-%Change-Post: 4 %
FEV1-%Pred-Post: 49 %
FEV1-%Pred-Pre: 47 %
FEV1-Post: 1.62 L
FEV1-Pre: 1.56 L
FEV1FVC-%Change-Post: 2 %
FEV1FVC-%Pred-Pre: 86 %
FEV6-%Change-Post: -1 %
FEV6-%Pred-Post: 54 %
FEV6-%Pred-Pre: 55 %
FEV6-Post: 2.16 L
FEV6-Pre: 2.2 L
FEV6FVC-%Pred-Post: 102 %
FEV6FVC-%Pred-Pre: 102 %
FVC-%Change-Post: 1 %
FVC-%Pred-Post: 55 %
FVC-%Pred-Pre: 54 %
FVC-Post: 2.24 L
FVC-Pre: 2.2 L
Post FEV1/FVC ratio: 72 %
Post FEV6/FVC ratio: 100 %
Pre FEV1/FVC ratio: 71 %
Pre FEV6/FVC Ratio: 100 %
RV % pred: 113 %
RV: 2.02 L
TLC % pred: 77 %
TLC: 4.26 L

## 2021-07-08 MED ORDER — ALBUTEROL SULFATE (2.5 MG/3ML) 0.083% IN NEBU
2.5000 mg | INHALATION_SOLUTION | Freq: Once | RESPIRATORY_TRACT | Status: AC
  Administered 2021-07-08: 2.5 mg via RESPIRATORY_TRACT

## 2021-07-12 ENCOUNTER — Ambulatory Visit (HOSPITAL_BASED_OUTPATIENT_CLINIC_OR_DEPARTMENT_OTHER)
Admission: RE | Admit: 2021-07-12 | Discharge: 2021-07-12 | Disposition: A | Discharge status: Home / Self Care | Payer: Medicare Other | Source: Ambulatory Visit | Attending: Internal Medicine | Admitting: Internal Medicine

## 2021-07-12 ENCOUNTER — Encounter (HOSPITAL_COMMUNITY): Payer: Self-pay | Admitting: Internal Medicine

## 2021-07-12 VITALS — BP 124/76 | HR 73 | Wt 199.6 lb

## 2021-07-12 DIAGNOSIS — E119 Type 2 diabetes mellitus without complications: Secondary | ICD-10-CM | POA: Insufficient documentation

## 2021-07-12 DIAGNOSIS — I340 Nonrheumatic mitral (valve) insufficiency: Secondary | ICD-10-CM

## 2021-07-12 DIAGNOSIS — B182 Chronic viral hepatitis C: Secondary | ICD-10-CM | POA: Insufficient documentation

## 2021-07-12 DIAGNOSIS — I5030 Unspecified diastolic (congestive) heart failure: Secondary | ICD-10-CM

## 2021-07-12 DIAGNOSIS — E669 Obesity, unspecified: Secondary | ICD-10-CM | POA: Insufficient documentation

## 2021-07-12 DIAGNOSIS — J449 Chronic obstructive pulmonary disease, unspecified: Secondary | ICD-10-CM | POA: Insufficient documentation

## 2021-07-12 DIAGNOSIS — I5033 Acute on chronic diastolic (congestive) heart failure: Secondary | ICD-10-CM | POA: Insufficient documentation

## 2021-07-12 DIAGNOSIS — I509 Heart failure, unspecified: Secondary | ICD-10-CM

## 2021-07-12 DIAGNOSIS — F17210 Nicotine dependence, cigarettes, uncomplicated: Secondary | ICD-10-CM | POA: Insufficient documentation

## 2021-07-12 DIAGNOSIS — I272 Pulmonary hypertension, unspecified: Secondary | ICD-10-CM | POA: Insufficient documentation

## 2021-07-12 DIAGNOSIS — Z720 Tobacco use: Secondary | ICD-10-CM

## 2021-07-12 DIAGNOSIS — I059 Rheumatic mitral valve disease, unspecified: Secondary | ICD-10-CM

## 2021-07-12 LAB — CBC
HCT: 45.6 % (ref 36.0–46.0)
Hemoglobin: 14.8 g/dL (ref 12.0–15.0)
MCH: 28.8 pg (ref 26.0–34.0)
MCHC: 32.5 g/dL (ref 30.0–36.0)
MCV: 88.9 fL (ref 80.0–100.0)
Platelets: 109 10*3/uL — ABNORMAL LOW (ref 150–400)
RBC: 5.13 MIL/uL — ABNORMAL HIGH (ref 3.87–5.11)
RDW: 15.6 % — ABNORMAL HIGH (ref 11.5–15.5)
WBC: 7.7 10*3/uL (ref 4.0–10.5)
nRBC: 0 % (ref 0.0–0.2)

## 2021-07-12 LAB — BASIC METABOLIC PANEL
Anion gap: 14 (ref 5–15)
BUN: 18 mg/dL (ref 6–20)
CO2: 24 mmol/L (ref 22–32)
Calcium: 8.8 mg/dL — ABNORMAL LOW (ref 8.9–10.3)
Chloride: 98 mmol/L (ref 98–111)
Creatinine, Ser: 0.98 mg/dL (ref 0.44–1.00)
GFR, Estimated: >60 mL/min (ref >60)
Glucose, Bld: 324 mg/dL — ABNORMAL HIGH (ref 70–99)
Potassium: 3.6 mmol/L (ref 3.5–5.1)
Sodium: 136 mmol/L (ref 135–145)

## 2021-07-12 LAB — BRAIN NATRIURETIC PEPTIDE: B Natriuretic Peptide: 667.6 pg/mL — ABNORMAL HIGH (ref 0.0–100.0)

## 2021-07-12 MED ORDER — TORSEMIDE 20 MG PO TABS: 60.0000 mg | ORAL_TABLET | Freq: Two times a day (BID) | ORAL | 3 refills | Status: DC

## 2021-07-12 MED ORDER — METOLAZONE 2.5 MG PO TABS: 2.5000 mg | ORAL_TABLET | ORAL | 1 refills | Status: DC

## 2021-07-12 NOTE — Patient Instructions (Addendum)
Increase Torsemide to 60 mg Twice daily  Start metolazone 2.5 mg Daily for 3 days only.  Take extra Potassium (2 Tablets) with the metolazone.  Labs done today, your results will be available in MyChart, we will contact you for abnormal readings.  Plan for hospital admission on Friday 07/15/2021. Hospital will call you when the bed is ready. **If weight down 15lbs Friday am Please the office so we can cancel the admission.**  Your physician recommends that you schedule a follow-up appointment in: 1 month  If you have any questions or concerns before your next appointment please send [place] a message through [place] or call our office at [phone].    TO LEAVE A MESSAGE FOR THE NURSE SELECT OPTION 2, PLEASE LEAVE A MESSAGE INCLUDING: YOUR NAME DATE OF BIRTH CALL BACK NUMBER REASON FOR CALL**this is important as we prioritize the call backs  YOU WILL RECEIVE A CALL BACK THE SAME DAY AS LONG AS YOU CALL BEFORE 4:00 PM  At the [facility], you and your health needs are our priority. As part of our continuing mission to provide you with exceptional heart care, we have created designated Provider Care Teams. These Care Teams include your primary Cardiologist (physician) and Advanced Practice Providers (APPs- Physician Assistants and Nurse Practitioners) who all work together to provide you with the care you need, when you need it.   You may see any of the following providers on your designated Care Team at your next follow up: Dr Arvilla Meres Dr Carron Curie, NP Robbie Lis, [facility] [place], [place] Karle Plumber, PharmD   Please be sure to bring in all your medications bottles to every appointment.

## 2021-07-12 NOTE — Progress Notes (Addendum)
[facility]ED [facility] CLINIC NOTE  Referring Physician: Dr. Excell Seltzer, Casimiro Needle Primary Care: Berta Minor Primary Cardiologist: Belva Crome  HPI: 45 yo female with h/o obesity, COPD with ongoing tobacco use, DM2, polysubstance abuse including cocaine and IV,opioid use, HCV, chronic diastolic HF, mitral valve endocarditis with severe MR/moderate MS and severe pulmonary HTN referred by Dr. Excell Seltzer for further evaluation of her PH prior to possible MVR.   She was hospitalized for nearly 2 months at [facility]  in 2015 for acute mitral valve endocarditis 2/2 IVDU  Since that time she has multiple hospitalizations for diastolic CHF. Recently readmitted to [facility] with recurrent HF. ECHO done at [facility] normal EF with severe MR, moderate MS and severe PH.  Diuresed and referred to Dr. Excell Seltzer who arranged for [facility] and TEE. Results below  [facility] 04/29/21: normal coronaries LVEDP = 17 RA  = 23 RV  = 105/10 PA   = 100/40 (59)  PW  = 28  (v 35)  CO 3.25, CI 1.6 PVR = 9.5 WU PAPi = 2.6  Mitral mean gradient 16, peak gradient 10, MVAI 0.32   TEE 04/29/2021: EF 60-65%, normal RV size and function, Mod-Severe MR, likely burnt out vegetation on both leaflets, mild to mod MS, TR mild-mod  Referred here for further evaluation prior to possible MVR.  She is here with her daughter. Recently seen by Dr. Laneta Simmers who agreed with the need for MVR but not sure if she would be a candidate unless she can be tuned up significantly. Has cut back smoking to 1/2 ppd. Can do ADLs without too much difficulty but SOB with anything else. Weight up about 9 pounds since we saw her previously. Taking torsemide 40 bid and spiro 12.5 daily with decent response. Drinking a lot of fluids at least a 6-pack per day of sprite/sierra mist    Past Medical History:  Diagnosis Date   Abdominal ascites 03/30/2021   Acute kidney injury (HCC) 03/30/2021   Acute respiratory failure with hypoxia and hypercapnia (HCC) 04/23/2016    Alcohol intoxication (HCC) 04/23/2016   Allergy    Anasarca 03/30/2021   Arthropathy    Asthma exacerbation 04/23/2016   Bacteremia due to methicillin susceptible Staphylococcus aureus (MSSA) 03/30/2021   Bipolar 1 disorder (HCC)    Cervical radicular pain 12/27/2012   Chronic airway obstruction (HCC)    Chronic obstructive pulmonary disease, unspecified (HCC) 05/29/2014   Chronic pain syndrome 12/27/2012   Chronic viral hepatitis C (HCC) 05/05/2016   Confusion 06/28/2014   CVA (cerebral vascular accident) (HCC) 03/30/2021   Cyst of left ovary 01/20/2016   Depression    Diabetes mellitus without complication (HCC)    Diabetic foot ulcer (HCC) 03/30/2021   Drug overdose 04/23/2016   Dysuria 03/30/2021   Endocarditis    Mitral valve, at [facility] 2015   Endocarditis due to methicillin susceptible Staphylococcus aureus (MSSA) 03/30/2021   Endocarditis of mitral valve 06/08/2014   Esophageal reflux    Essential hypertension 05/29/2014   Fluid retention 12/11/2019   Heart failure, acute diastolic (HCC) 03/30/2021   Heart failure, unspecified (HCC) 03/30/2021   Hepatitis    History of endocarditis 03/30/2021   History of intravenous drug abuse (HCC) 01/01/2020   History of stroke 05/29/2014   Hyperlipidemia    Hypertension    Hypomenorrhea/oligomenorrhea 01/20/2016   Hyponatremia 03/30/2021   IVDU (intravenous drug user) 04/23/2016   Low back pain, unspecified 12/27/2012   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   Lung mass 03/30/2021   Major  depressive disorder, recurrent, in remission, unspecified (HCC) 06/12/2014   Mitral regurgitation 05/05/2016   Nauseated 03/30/2021   Neck pain 12/27/2012   Old cerebrovascular accident (CVA) without late effect 11/05/2018   Opioid abuse (HCC)    Osteomyelitis of toe of left foot (HCC) 06/08/2014   Overdose 04/23/2016   Pneumonia 03/30/2021   Polyneuropathy    Tinea cruris 03/30/2021   Tobacco abuse 06/30/2014   Transaminitis 04/23/2016   Unspecified visual loss 06/28/2014    Current  Outpatient Medications  Medication Sig Dispense Refill   cariprazine (VRAYLAR) 1.5 MG capsule Take 1.5 mg by mouth in the morning.     famotidine (PEPCID) 40 MG tablet Take 40 mg by mouth in the morning.     nicotine (NICODERM CQ - DOSED IN MG/24 HOURS) 21 mg/24hr patch Place 21 mg onto the skin daily.     potassium chloride SA (KLOR-CON) 20 MEQ tablet Take 2 tablets (40 mEq total) by mouth daily. 60 tablet 3   pregabalin (LYRICA) 150 MG capsule Take 150 mg by mouth 3 (three) times daily.     Semaglutide (RYBELSUS) 3 MG TABS Take 3 mg by mouth in the morning.     spironolactone (ALDACTONE) 25 MG tablet Take 0.5 tablets (12.5 mg total) by mouth daily. 15 tablet 3   SUBOXONE 8-2 MG FILM Take 1 Film by mouth 2 (two) times daily.     torsemide (DEMADEX) 20 MG tablet Take 2 tablets (40 mg total) by mouth 2 (two) times daily. 120 tablet 3   VENTOLIN HFA 108 (90 Base) MCG/ACT inhaler Inhale 1-2 puffs into the lungs every 6 (six) hours as needed for shortness of breath.     vitamin B-12 (CYANOCOBALAMIN) 500 MCG tablet Take 500 mcg by mouth daily.     No current facility-administered medications for this encounter.    Allergies  Allergen Reactions   Amoxicillin Anaphylaxis   Keflex [Cephalexin] Itching and Rash   Penicillins Rash    Has patient had a PCN reaction causing immediate rash, facial/tongue/throat swelling, SOB or lightheadedness with hypotension:Yes Has patient had a PCN reaction causing severe rash involving mucus membranes or skin necrosis:no Has patient had a PCN reaction that required hospitalization:unsure Has patient had a PCN reaction occurring within the last 10 years:No If all of the above answers are "NO", then may proceed with Cephalosporin use.       Social History   Socioeconomic History   Marital status: Single    Spouse name: Not on file   Number of children: Not on file   Years of education: Not on file   Highest education level: Not on file  Occupational  History   Not on file  Tobacco Use   Smoking status: Every Day   Smokeless tobacco: Never  Vaping Use   Vaping Use: Every day  Substance and Sexual Activity   Alcohol use: Yes   Drug use: Yes    Types: Cocaine, Heroin    Comment: heroin tonight   Sexual activity: Not on file  Other Topics Concern   Not on file  Social History Narrative   Not on file   Social Determinants of Health   Financial Resource Strain: Not on file  Food Insecurity: Not on file  Transportation Needs: Not on file  Physical Activity: Not on file  Stress: Not on file  Social Connections: Not on file  Intimate Partner Violence: Not on file      Family History  Problem Relation Age of  Onset   ADD / ADHD Other    Alcohol abuse Other    Stroke Other    Diabetes Other    Hypertension Other     Vitals:   07/12/21 0951  BP: 124/76  Pulse: 73  SpO2: 96%  Weight: 90.5 kg (199 lb 9.6 oz)   Wt Readings from Last 3 Encounters:  07/12/21 90.5 kg (199 lb 9.6 oz)  06/29/21 86.6 kg (191 lb)  05/27/21 87.6 kg (193 lb 3.2 oz)     PHYSICAL EXAM: General:  Obese chronically ill appearing HEENT: normal Neck: supple. + jvp Carotids 2+ bilat; no bruits. No lymphadenopathy or thryomegaly appreciated. Cor: PMI nondisplaced. Regular rate & rhythm.2/6 TR  3/6 MR Lungs:  decreased throguhout Abdomen: obese soft, nontender, nondistended. No hepatosplenomegaly. No bruits or masses. Good bowel sounds. Extremities: no cyanosis, clubbing, rash, 3+ woodytedema Neuro: alert & orientedx3, cranial nerves grossly intact. moves all 4 extremities w/o difficulty. Affect pleasant  ReDS: 37% -> 49%   ASSESSMENT & PLAN:  1. Acute on Chronic Diastolic HF due to severe mitral valve disease -TEE 04/29/2021 EF 60-65%, normal RV size and function, Severe MR,  Mod MS likely burnt out vegetation on both leaflets, TR mild-mod - NYHA IIIb with R>L HF - Remains markedly volume overloaded. Failing outpatient management. Will need to  be admitted for better volume control but wants to wait until after Thanksgiving - Increase torsemide to 60 bid add metolazone 2.5 mg daily for next 3 days with Kdur extra - If weight not down at least 15 pounds will direct admit on Friday - Continue spironolactone 12.5 daily - Check labs today and in 1 week  2. Pulmonary HTN - multifactorial, based on current workup primarily group 2 and 3  - Hall walk sats remained 92-95% - She will need optimization of her inhaler regimen as well as a sleep study - She is seeing Dr. Blenda Nicely  - Stressed need to stop smoking  3. Mitral valve disease, severe -TEE 04/29/2021 EF 60-65%, normal RV size and function, Mod MS/Severe MR, likely burnt out vegetation on both leaflets, TR mild-mod - Ideally she will need to valve replaced but currently not a surgical candidate - Has seen Dr. Laneta Simmers and agrees with need for surgical MVR but has to stop smoking completely and be optimized. We discussed this at length - She will likely need one admit now for volume optimization and then working on quitting smoking followed by another admit for IV optimization with possible milrinone - With markedly elevated PCW on recent cath will not start selective pulmonary artery vasodilators at this time  4. COPD with ongoing tobacco use - Following with Dr. Blenda Nicely - stressed need to quit   5. Opioid Use Disorder -sees Dr. Nedra Hai in [place] who prescribes suboxone -has not used IV opiates or cocaine in 2 years per her report  6. HCV -needs ID follow up eventually  Total time spent 45 minutes. Over half that time spent discussing above.   Arvilla Meres, MD  10:00 AM

## 2021-07-12 NOTE — Progress Notes (Signed)
ReDS Vest / Clip - 07/12/21 1000       ReDS Vest / Clip   Station Marker C    Ruler Value 26    ReDS Value Range High volume overload    ReDS Actual Value 49

## 2021-07-15 ENCOUNTER — Inpatient Hospital Stay: Payer: Self-pay

## 2021-07-15 ENCOUNTER — Telehealth (HOSPITAL_COMMUNITY): Payer: Self-pay | Admitting: Adult Health

## 2021-07-15 ENCOUNTER — Inpatient Hospital Stay (HOSPITAL_COMMUNITY)
Admission: RE | Admit: 2021-07-15 | Discharge: 2021-07-23 | DRG: 286 | Disposition: A | Discharge status: Other Acute Inpatient Hospital | Payer: Medicare Other | Source: Other Acute Inpatient Hospital | Attending: Internal Medicine | Admitting: Internal Medicine

## 2021-07-15 ENCOUNTER — Telehealth: Payer: Self-pay | Admitting: Nurse Practitioner

## 2021-07-15 DIAGNOSIS — J449 Chronic obstructive pulmonary disease, unspecified: Secondary | ICD-10-CM | POA: Diagnosis present

## 2021-07-15 DIAGNOSIS — Z20822 Contact with and (suspected) exposure to covid-19: Secondary | ICD-10-CM | POA: Diagnosis present

## 2021-07-15 DIAGNOSIS — K0850 Unsatisfactory restoration of tooth, unspecified: Secondary | ICD-10-CM

## 2021-07-15 DIAGNOSIS — K036 Deposits [accretions] on teeth: Secondary | ICD-10-CM

## 2021-07-15 DIAGNOSIS — Z823 Family history of stroke: Secondary | ICD-10-CM

## 2021-07-15 DIAGNOSIS — Z880 Allergy status to penicillin: Secondary | ICD-10-CM

## 2021-07-15 DIAGNOSIS — K030 Excessive attrition of teeth: Secondary | ICD-10-CM

## 2021-07-15 DIAGNOSIS — Z79899 Other long term (current) drug therapy: Secondary | ICD-10-CM | POA: Diagnosis not present

## 2021-07-15 DIAGNOSIS — Z6830 Body mass index (BMI) 30.0-30.9, adult: Secondary | ICD-10-CM | POA: Diagnosis not present

## 2021-07-15 DIAGNOSIS — I5033 Acute on chronic diastolic (congestive) heart failure: Principal | ICD-10-CM | POA: Diagnosis present

## 2021-07-15 DIAGNOSIS — K5900 Constipation, unspecified: Secondary | ICD-10-CM | POA: Diagnosis present

## 2021-07-15 DIAGNOSIS — I2721 Secondary pulmonary arterial hypertension: Secondary | ICD-10-CM | POA: Diagnosis present

## 2021-07-15 DIAGNOSIS — K011 Impacted teeth: Secondary | ICD-10-CM

## 2021-07-15 DIAGNOSIS — M109 Gout, unspecified: Secondary | ICD-10-CM | POA: Diagnosis present

## 2021-07-15 DIAGNOSIS — Z8249 Family history of ischemic heart disease and other diseases of the circulatory system: Secondary | ICD-10-CM

## 2021-07-15 DIAGNOSIS — E119 Type 2 diabetes mellitus without complications: Secondary | ICD-10-CM | POA: Diagnosis present

## 2021-07-15 DIAGNOSIS — K029 Dental caries, unspecified: Secondary | ICD-10-CM | POA: Diagnosis present

## 2021-07-15 DIAGNOSIS — Z01818 Encounter for other preprocedural examination: Secondary | ICD-10-CM | POA: Diagnosis not present

## 2021-07-15 DIAGNOSIS — E669 Obesity, unspecified: Secondary | ICD-10-CM | POA: Diagnosis present

## 2021-07-15 DIAGNOSIS — I340 Nonrheumatic mitral (valve) insufficiency: Secondary | ICD-10-CM | POA: Diagnosis present

## 2021-07-15 DIAGNOSIS — Z8673 Personal history of transient ischemic attack (TIA), and cerebral infarction without residual deficits: Secondary | ICD-10-CM | POA: Diagnosis not present

## 2021-07-15 DIAGNOSIS — Z452 Encounter for adjustment and management of vascular access device: Secondary | ICD-10-CM

## 2021-07-15 DIAGNOSIS — Z881 Allergy status to other antibiotic agents status: Secondary | ICD-10-CM

## 2021-07-15 DIAGNOSIS — K08109 Complete loss of teeth, unspecified cause, unspecified class: Secondary | ICD-10-CM | POA: Diagnosis not present

## 2021-07-15 DIAGNOSIS — F1190 Opioid use, unspecified, uncomplicated: Secondary | ICD-10-CM | POA: Diagnosis present

## 2021-07-15 DIAGNOSIS — I110 Hypertensive heart disease with heart failure: Principal | ICD-10-CM | POA: Diagnosis present

## 2021-07-15 DIAGNOSIS — Z01810 Encounter for preprocedural cardiovascular examination: Secondary | ICD-10-CM | POA: Diagnosis not present

## 2021-07-15 DIAGNOSIS — K0530 Chronic periodontitis, unspecified: Secondary | ICD-10-CM

## 2021-07-15 DIAGNOSIS — Z833 Family history of diabetes mellitus: Secondary | ICD-10-CM | POA: Diagnosis not present

## 2021-07-15 DIAGNOSIS — F17210 Nicotine dependence, cigarettes, uncomplicated: Secondary | ICD-10-CM | POA: Diagnosis present

## 2021-07-15 LAB — TSH: TSH: 1.964 u[IU]/mL (ref 0.350–4.500)

## 2021-07-15 LAB — COMPREHENSIVE METABOLIC PANEL
ALT: 13 U/L (ref 0–44)
AST: 20 U/L (ref 15–41)
Albumin: 3.9 g/dL (ref 3.5–5.0)
Alkaline Phosphatase: 100 U/L (ref 38–126)
Anion gap: 11 (ref 5–15)
BUN: 20 mg/dL (ref 6–20)
CO2: 30 mmol/L (ref 22–32)
Calcium: 8.9 mg/dL (ref 8.9–10.3)
Chloride: 96 mmol/L — ABNORMAL LOW (ref 98–111)
Creatinine, Ser: 1.11 mg/dL — ABNORMAL HIGH (ref 0.44–1.00)
GFR, Estimated: >60 mL/min (ref >60)
Glucose, Bld: 253 mg/dL — ABNORMAL HIGH (ref 70–99)
Potassium: 3.5 mmol/L (ref 3.5–5.1)
Sodium: 137 mmol/L (ref 135–145)
Total Bilirubin: 1.3 mg/dL — ABNORMAL HIGH (ref 0.3–1.2)
Total Protein: 7.6 g/dL (ref 6.5–8.1)

## 2021-07-15 LAB — CBC WITH DIFFERENTIAL/PLATELET
Abs Immature Granulocytes: 0.02 10*3/uL (ref 0.00–0.07)
Basophils Absolute: 0.1 10*3/uL (ref 0.0–0.1)
Basophils Relative: 1 %
Eosinophils Absolute: 0.1 10*3/uL (ref 0.0–0.5)
Eosinophils Relative: 2 %
HCT: 47 % — ABNORMAL HIGH (ref 36.0–46.0)
Hemoglobin: 14.9 g/dL (ref 12.0–15.0)
Immature Granulocytes: 0 %
Lymphocytes Relative: 18 %
Lymphs Abs: 1.4 10*3/uL (ref 0.7–4.0)
MCH: 28.8 pg (ref 26.0–34.0)
MCHC: 31.7 g/dL (ref 30.0–36.0)
MCV: 90.7 fL (ref 80.0–100.0)
Monocytes Absolute: 0.4 10*3/uL (ref 0.1–1.0)
Monocytes Relative: 6 %
Neutro Abs: 5.5 10*3/uL (ref 1.7–7.7)
Neutrophils Relative %: 73 %
Platelets: 107 10*3/uL — ABNORMAL LOW (ref 150–400)
RBC: 5.18 MIL/uL — ABNORMAL HIGH (ref 3.87–5.11)
RDW: 15.8 % — ABNORMAL HIGH (ref 11.5–15.5)
WBC: 7.5 10*3/uL (ref 4.0–10.5)
nRBC: 0 % (ref 0.0–0.2)

## 2021-07-15 LAB — RESP PANEL BY RT-PCR (FLU A&B, COVID) ARPGX2
Influenza A by PCR: NEGATIVE
Influenza B by PCR: NEGATIVE
SARS Coronavirus 2 by RT PCR: NEGATIVE

## 2021-07-15 LAB — MAGNESIUM: Magnesium: 2 mg/dL (ref 1.7–2.4)

## 2021-07-15 LAB — HIV ANTIBODY (ROUTINE TESTING W REFLEX): HIV Screen 4th Generation wRfx: NONREACTIVE

## 2021-07-15 LAB — BRAIN NATRIURETIC PEPTIDE: B Natriuretic Peptide: 671.5 pg/mL — ABNORMAL HIGH (ref 0.0–100.0)

## 2021-07-15 LAB — MRSA NEXT GEN BY PCR, NASAL: MRSA by PCR Next Gen: NOT DETECTED

## 2021-07-15 MED ORDER — SEMAGLUTIDE 3 MG PO TABS: 3.0000 mg | ORAL_TABLET | Freq: Every morning | ORAL | Status: DC

## 2021-07-15 MED ORDER — PREGABALIN 75 MG PO CAPS
150.0000 mg | ORAL_CAPSULE | Freq: Three times a day (TID) | ORAL | Status: DC
  Administered 2021-07-15 – 2021-07-23 (×24): 150 mg via ORAL
  Filled 2021-07-15 (×11): qty 2
  Filled 2021-07-15: qty 6
  Filled 2021-07-15 (×7): qty 2
  Filled 2021-07-15: qty 6
  Filled 2021-07-15 (×4): qty 2

## 2021-07-15 MED ORDER — FAMOTIDINE 20 MG PO TABS
40.0000 mg | ORAL_TABLET | Freq: Every day | ORAL | Status: DC
  Administered 2021-07-17: 11:00:00 40 mg via ORAL
  Filled 2021-07-15: qty 2

## 2021-07-15 MED ORDER — ALBUTEROL SULFATE (2.5 MG/3ML) 0.083% IN NEBU
2.5000 mg | INHALATION_SOLUTION | Freq: Four times a day (QID) | RESPIRATORY_TRACT | Status: DC | PRN
  Administered 2021-07-19: 2.5 mg via RESPIRATORY_TRACT
  Filled 2021-07-15: qty 3

## 2021-07-15 MED ORDER — SODIUM CHLORIDE 0.9 % IV SOLN: 250.0000 mL | INTRAVENOUS | Status: DC | PRN

## 2021-07-15 MED ORDER — ENOXAPARIN SODIUM 40 MG/0.4ML IJ SOSY: 40.0000 mg | PREFILLED_SYRINGE | INTRAMUSCULAR | Status: DC

## 2021-07-15 MED ORDER — SODIUM CHLORIDE 0.9% FLUSH: 3.0000 mL | INTRAVENOUS | Status: DC | PRN

## 2021-07-15 MED ORDER — NICOTINE 21 MG/24HR TD PT24
21.0000 mg | MEDICATED_PATCH | Freq: Every day | TRANSDERMAL | Status: DC
  Administered 2021-07-16 – 2021-07-23 (×8): 21 mg via TRANSDERMAL
  Filled 2021-07-15 (×8): qty 1

## 2021-07-15 MED ORDER — CARIPRAZINE HCL 1.5 MG PO CAPS
1.5000 mg | ORAL_CAPSULE | Freq: Every day | ORAL | Status: DC
  Administered 2021-07-16 – 2021-07-23 (×8): 1.5 mg via ORAL
  Filled 2021-07-15 (×8): qty 1

## 2021-07-15 MED ORDER — ALBUTEROL SULFATE HFA 108 (90 BASE) MCG/ACT IN AERS: 1.0000 | INHALATION_SPRAY | Freq: Four times a day (QID) | RESPIRATORY_TRACT | Status: DC | PRN

## 2021-07-15 MED ORDER — NICOTINE 21 MG/24HR TD PT24: 21.0000 mg | MEDICATED_PATCH | Freq: Every day | TRANSDERMAL | Status: DC

## 2021-07-15 MED ORDER — FUROSEMIDE 10 MG/ML IJ SOLN
80.0000 mg | Freq: Once | INTRAMUSCULAR | Status: AC
  Administered 2021-07-15: 80 mg via INTRAVENOUS
  Filled 2021-07-15: qty 8

## 2021-07-15 MED ORDER — BUPRENORPHINE HCL-NALOXONE HCL 8-2 MG SL SUBL: 1.0000 | SUBLINGUAL_TABLET | Freq: Two times a day (BID) | SUBLINGUAL | Status: DC

## 2021-07-15 MED ORDER — PREGABALIN 75 MG PO CAPS: 150.0000 mg | ORAL_CAPSULE | Freq: Three times a day (TID) | ORAL | Status: DC

## 2021-07-15 MED ORDER — BUPRENORPHINE HCL-NALOXONE HCL 8-2 MG SL SUBL
1.0000 | SUBLINGUAL_TABLET | Freq: Two times a day (BID) | SUBLINGUAL | Status: DC
Start: 2021-07-15 — End: 2021-07-15

## 2021-07-15 MED ORDER — BUPRENORPHINE HCL-NALOXONE HCL 8-2 MG SL SUBL
1.0000 | SUBLINGUAL_TABLET | Freq: Two times a day (BID) | SUBLINGUAL | Status: DC
  Administered 2021-07-16 – 2021-07-23 (×16): 1 via SUBLINGUAL
  Filled 2021-07-15 (×17): qty 1

## 2021-07-15 MED ORDER — SPIRONOLACTONE 12.5 MG HALF TABLET
12.5000 mg | ORAL_TABLET | Freq: Every day | ORAL | Status: DC
  Administered 2021-07-16 – 2021-07-23 (×7): 12.5 mg via ORAL
  Filled 2021-07-15 (×7): qty 1

## 2021-07-15 MED ORDER — CARIPRAZINE HCL 1.5 MG PO CAPS: 1.5000 mg | ORAL_CAPSULE | Freq: Every day | ORAL | Status: DC

## 2021-07-15 MED ORDER — FLUTICASONE FUROATE-VILANTEROL 200-25 MCG/ACT IN AEPB
1.0000 | INHALATION_SPRAY | Freq: Every day | RESPIRATORY_TRACT | Status: DC
  Administered 2021-07-16 – 2021-07-23 (×7): 1 via RESPIRATORY_TRACT
  Filled 2021-07-15: qty 28

## 2021-07-15 MED ORDER — ACETAMINOPHEN 325 MG PO TABS
650.0000 mg | ORAL_TABLET | ORAL | Status: DC | PRN
  Administered 2021-07-17 – 2021-07-23 (×10): 650 mg via ORAL
  Filled 2021-07-15 (×6): qty 2

## 2021-07-15 MED ORDER — SODIUM CHLORIDE 0.9% FLUSH
3.0000 mL | Freq: Two times a day (BID) | INTRAVENOUS | Status: DC
  Administered 2021-07-16 – 2021-07-21 (×10): 3 mL via INTRAVENOUS

## 2021-07-15 MED ORDER — ONDANSETRON HCL 4 MG/2ML IJ SOLN: 4.0000 mg | Freq: Four times a day (QID) | INTRAMUSCULAR | Status: DC | PRN

## 2021-07-15 MED ORDER — POTASSIUM CHLORIDE CRYS ER 20 MEQ PO TBCR
40.0000 meq | EXTENDED_RELEASE_TABLET | Freq: Two times a day (BID) | ORAL | Status: DC
  Administered 2021-07-15 – 2021-07-17 (×5): 40 meq via ORAL
  Filled 2021-07-15 (×5): qty 2

## 2021-07-15 MED ORDER — UMECLIDINIUM BROMIDE 62.5 MCG/ACT IN AEPB
1.0000 | INHALATION_SPRAY | Freq: Every day | RESPIRATORY_TRACT | Status: DC
  Administered 2021-07-16 – 2021-07-23 (×7): 1 via RESPIRATORY_TRACT
  Filled 2021-07-15: qty 7

## 2021-07-15 MED ORDER — ENOXAPARIN SODIUM 40 MG/0.4ML IJ SOSY
40.0000 mg | PREFILLED_SYRINGE | INTRAMUSCULAR | Status: DC
  Administered 2021-07-16 – 2021-07-19 (×4): 40 mg via SUBCUTANEOUS
  Filled 2021-07-15 (×4): qty 0.4

## 2021-07-15 MED ORDER — FLUTICASONE-UMECLIDIN-VILANT 200-62.5-25 MCG/ACT IN AEPB: 1.0000 | INHALATION_SPRAY | Freq: Every day | RESPIRATORY_TRACT | Status: DC

## 2021-07-15 MED ORDER — FAMOTIDINE 20 MG PO TABS
40.0000 mg | ORAL_TABLET | Freq: Every day | ORAL | Status: DC
  Administered 2021-07-16 – 2021-07-23 (×8): 40 mg via ORAL
  Filled 2021-07-15 (×8): qty 2

## 2021-07-15 MED ORDER — FUROSEMIDE 10 MG/ML IJ SOLN
8.0000 mg/h | INTRAVENOUS | Status: DC
  Administered 2021-07-15 – 2021-07-18 (×6): 15 mg/h via INTRAVENOUS
  Administered 2021-07-19 – 2021-07-20 (×2): 8 mg/h via INTRAVENOUS
  Filled 2021-07-15 (×13): qty 20

## 2021-07-15 NOTE — Telephone Encounter (Signed)
   Spoke to [place].   She reports poor diuresis on higher  oral diuretic regimen. Legs/abd swollen.   Weight down 5 pounds.   Bed control aware of direct admit (I confirmed).   I explained bed control will call her once bed available.   She appreciated the call.   Halen Mossbarger NP- C 10:14 AM

## 2021-07-15 NOTE — H&P (Signed)
Advanced Heart Failure Team History and Physical Note   PCP:  Darrol Jump, NP  PCP-Cardiology: None     Reason for Admission: A/C HFpEF   HPI:   [name] is a 45 yo female with h/o obesity, COPD with ongoing tobacco use, DM2, polysubstance abuse including cocaine and IV,opioid use, HCV, chronic diastolic HF, mitral valve endocarditis with severe MR/moderate MS and severe pulmonary HTN, and mitral valve disease.     She was hospitalized for nearly 2 months at [facility]  in 2015 for acute mitral valve endocarditis 2/2 IVDU  Since that time she has multiple hospitalizations for diastolic CHF. Recently readmitted to [facility] with recurrent HF. ECHO done at [facility] normal EF with severe MR, moderate MS and severe PH.  Diuresed and referred to Dr. Burt Knack who arranged for [facility] and TEE. Results below   [facility] 04/29/21: normal coronaries LVEDP = 17 RA  = 23 RV  = 105/10 PA   = 100/40 (59)  PW  = 28  (v 35)  CO 3.25, CI 1.6 PVR = 9.5 WU PAPi = 2.6  Mitral mean gradient 16, peak gradient 10, MVAI 0.32    TEE 04/29/2021: EF 60-65%, normal RV size and function, Mod-Severe MR, likely burnt out vegetation on both leaflets, mild to mod MS, TR mild-mod   Saw Dr Haroldine Laws on 07/13/21 and was volume overloaded. Torsemide was increased to 60 mg twice a day with daily metolazone x3 days. Weight only went down 5 pounds. Remains SOB with any activity. Severe LE edema.    Review of Systems: [y] = yes, [ ]  = no   General: Weight gain [ ] ; Weight loss [ ] ; Anorexia [ ] ; Fatigue [ Y]; Fever [ ] ; Chills [ ] ; Weakness [ Y]  Cardiac: Chest pain/pressure [ ] ; Resting SOB [ ] ; Exertional SOB [ Y]; Orthopnea [ Y]; Pedal Edema [Y ]; Palpitations [ ] ; Syncope [ ] ; Presyncope [ ] ; Paroxysmal nocturnal dyspnea[ ]   Pulmonary: Cough [ ] ; Wheezing[ ] ; Hemoptysis[ ] ; Sputum [ ] ; Snoring [ ]   GI: Vomiting[ ] ; Dysphagia[ ] ; Melena[ ] ; Hematochezia [ ] ; Heartburn[ ] ; Abdominal pain [ ] ; Constipation [ ] ;  Diarrhea [ ] ; BRBPR [ ]   GU: Hematuria[ ] ; Dysuria [ ] ; Nocturia[ ]   Vascular: Pain in legs with walking [ ] ; Pain in feet with lying flat [ ] ; Non-healing sores [ ] ; Stroke [ Y]; TIA [ ] ; Slurred speech [ ] ;  Neuro: Headaches[ ] ; Vertigo[ ] ; Seizures[ ] ; Paresthesias[ ] ;Blurred vision [ ] ; Diplopia [ ] ; Vision changes [ ]   Ortho/Skin: Arthritis [ Y]; Joint pain [ Y]; Muscle pain [ ] ; Joint swelling [ ] ; Back Pain [ Y]; Rash [ ]   Psych: Depression[ ] ; Anxiety[ ]   Heme: Bleeding problems [ ] ; Clotting disorders [ ] ; Anemia [ ]   Endocrine: Diabetes [ Y]; Thyroid dysfunction[ ]    Home Medications Prior to Admission medications   Medication Sig Start Date End Date Taking? Authorizing Provider  cariprazine (VRAYLAR) 1.5 MG capsule Take 1.5 mg by mouth in the morning. 01/26/21   [provider]  famotidine (PEPCID) 40 MG tablet Take 40 mg by mouth in the morning. 01/26/21   [provider]  metolazone (ZAROXOLYN) 2.5 MG tablet Take 1 tablet (2.5 mg total) by mouth as directed. For 3 days with potassium  per Heart Failure clinic 07/12/21   Rohn Fritsch, Shaune Pascal, MD  nicotine (NICODERM CQ - DOSED IN MG/24 HOURS) 21 mg/24hr patch Place 21  mg onto the skin daily.    [provider]  potassium chloride SA (KLOR-CON) 20 MEQ tablet Take 2 tablets (40 mEq total) by mouth daily. 06/23/21   Mickaela Starlin, Shaune Pascal, MD  pregabalin (LYRICA) 150 MG capsule Take 150 mg by mouth 3 (three) times daily. 01/26/21   [provider]  Semaglutide (RYBELSUS) 3 MG TABS Take 3 mg by mouth in the morning. 01/26/21   [provider]  spironolactone (ALDACTONE) 25 MG tablet Take 0.5 tablets (12.5 mg total) by mouth daily. 05/27/21   Vale Peraza, Shaune Pascal, MD  SUBOXONE 8-2 MG FILM Take 1 Film by mouth 2 (two) times daily. 03/31/16   [provider]  torsemide (DEMADEX) 20 MG tablet Take 3 tablets (60 mg total) by mouth 2 (two) times daily. 07/12/21   Wylene Weissman, Shaune Pascal, MD  VENTOLIN HFA 108  (90 Base) MCG/ACT inhaler Inhale 1-2 puffs into the lungs every 6 (six) hours as needed for shortness of breath. 01/16/16   [provider]  vitamin B-12 (CYANOCOBALAMIN) 500 MCG tablet Take 500 mcg by mouth daily.    [provider]    Past Medical History: Past Medical History:  Diagnosis Date   Abdominal ascites 03/30/2021   Acute kidney injury ([place]) 03/30/2021   Acute respiratory failure with hypoxia and hypercapnia (HCC) 04/23/2016   Alcohol intoxication ([place]) 04/23/2016   Allergy    Anasarca 03/30/2021   Arthropathy    Asthma exacerbation 04/23/2016   Bacteremia due to methicillin susceptible Staphylococcus aureus (MSSA) 03/30/2021   Bipolar 1 disorder (HCC)    Cervical radicular pain 12/27/2012   Chronic airway obstruction (HCC)    Chronic obstructive pulmonary disease, unspecified ([place]) 05/29/2014   Chronic pain syndrome 12/27/2012   Chronic viral hepatitis C ([place]) 05/05/2016   Confusion 06/28/2014   CVA (cerebral vascular accident) ([place]) 03/30/2021   Cyst of left ovary 01/20/2016   Depression    Diabetes mellitus without complication ([place])    Diabetic foot ulcer ([place]) 03/30/2021   Drug overdose 04/23/2016   Dysuria 03/30/2021   Endocarditis    Mitral valve, at [facility] 2015   Endocarditis due to methicillin susceptible Staphylococcus aureus (MSSA) 03/30/2021   Endocarditis of mitral valve 06/08/2014   Esophageal reflux    Essential hypertension 05/29/2014   Fluid retention 12/11/2019   Heart failure, acute diastolic ([place]) [id number]   Heart failure, unspecified ([place]) 03/30/2021   Hepatitis    History of endocarditis 03/30/2021   History of intravenous drug abuse ([place]) 01/01/2020   History of stroke 05/29/2014   Hyperlipidemia    Hypertension    Hypomenorrhea/oligomenorrhea 01/20/2016   Hyponatremia 03/30/2021   IVDU (intravenous drug user) 04/23/2016   Low back pain, unspecified 12/27/2012   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   Lung mass 03/30/2021   Major depressive disorder, recurrent,  in remission, unspecified ([place]) 06/12/2014   Mitral regurgitation 05/05/2016   Nauseated 03/30/2021   Neck pain 12/27/2012   Old cerebrovascular accident (CVA) without late effect 11/05/2018   Opioid abuse ([place])    Osteomyelitis of toe of left foot ([place]) 06/08/2014   Overdose 04/23/2016   Pneumonia 03/30/2021   Polyneuropathy    Tinea cruris 03/30/2021   Tobacco abuse 06/30/2014   Transaminitis 04/23/2016   Unspecified visual loss 06/28/2014    Past Surgical History: Past Surgical History:  Procedure Laterality Date   adnoidectomy     CESAREAN SECTION     RIGHT/LEFT HEART CATH AND CORONARY ANGIOGRAPHY N/A 04/29/2021   Procedure: RIGHT/LEFT HEART CATH  AND CORONARY ANGIOGRAPHY;  Surgeon: Wellington Hampshire, MD;  Location: [facility];  Service: Cardiovascular;  Laterality: N/A;   TEE WITHOUT CARDIOVERSION N/A 04/29/2021   Procedure: TRANSESOPHAGEAL ECHOCARDIOGRAM (TEE);  Surgeon: Josue Hector, MD;  Location: [facility] ENDOSCOPY;  Service: Cardiovascular;  Laterality: N/A;  CATH AFTER PROCEDURE   TONSILLECTOMY      Family History:  Family History  Problem Relation Age of Onset   ADD / ADHD Other    Alcohol abuse Other    Stroke Other    Diabetes Other    Hypertension Other     Social History: Social History   Socioeconomic History   Marital status: Single    Spouse name: Not on file   Number of children: Not on file   Years of education: Not on file   Highest education level: Not on file  Occupational History   Not on file  Tobacco Use   Smoking status: Every Day   Smokeless tobacco: Never  Vaping Use   Vaping Use: Every day  Substance and Sexual Activity   Alcohol use: Yes   Drug use: Yes    Types: Cocaine, Heroin    Comment: heroin tonight   Sexual activity: Not on file  Other Topics Concern   Not on file  Social History Narrative   Not on file   Social Determinants of Health   Financial Resource Strain: Not on file  Food Insecurity: Not on file  Transportation  Needs: Not on file  Physical Activity: Not on file  Stress: Not on file  Social Connections: Not on file    Allergies:  Allergies  Allergen Reactions   Amoxicillin Anaphylaxis   Keflex [Cephalexin] Itching and Rash   Penicillins Rash    Has patient had a PCN reaction causing immediate rash, facial/tongue/throat swelling, SOB or lightheadedness with hypotension:Yes Has patient had a PCN reaction causing severe rash involving mucus membranes or skin necrosis:no Has patient had a PCN reaction that required hospitalization:unsure Has patient had a PCN reaction occurring within the last 10 years:No If all of the above answers are "NO", then may proceed with Cephalosporin use.     Objective:    Vital Signs:   Temp:  [97.7 F (36.5 C)] 97.7 F (36.5 C) (11/25 1420) Pulse Rate:  [65] 65 (11/25 1420) Resp:  [15] 15 (11/25 1420) BP: (115)/(71) 115/71 (11/25 1420) SpO2:  [96 %] 96 % (11/25 1420) Weight:  [89.1 kg] 89.1 kg (11/25 1420)   Filed Weights   07/15/21 1420  Weight: 89.1 kg     Physical Exam     General:  Sitting up on side of bed . No respiratory difficulty HEENT: Normal Neck: Supple. JVP to ear Carotids 2+ bilat; no bruits. No lymphadenopathy or thyromegaly appreciated. Cor: PMI nondisplaced. Regular rate & rhythm. 2/6 TR & MR Lungs: Clear Abdomen: Soft, nontender, nondistended. No hepatosplenomegaly. No bruits or masses. Good bowel sounds. Extremities: No cyanosis, clubbing, rash, 3+ tight edema Neuro: Alert & oriented x 3, cranial nerves grossly intact. moves all 4 extremities w/o difficulty. Affect pleasant.   Telemetry   Sinus 60s Personally reviewed   Labs     Basic Metabolic Panel: Recent Labs  Lab 07/12/21 1018 07/15/21 1429  NA 136 137  K 3.6 3.5  CL 98 96*  CO2 24 30  GLUCOSE 324* 253*  BUN 18 20  CREATININE 0.98 1.11*  CALCIUM 8.8* 8.9  MG  --  2.0    Liver Function  Tests: Recent Labs  Lab 07/15/21 1429  AST 20  ALT 13  ALKPHOS  100  BILITOT 1.3*  PROT 7.6  ALBUMIN 3.9   No results for input(s): LIPASE, AMYLASE in the last 168 hours. No results for input(s): AMMONIA in the last 168 hours.  CBC: Recent Labs  Lab 07/12/21 1018 07/15/21 1429  WBC 7.7 7.5  NEUTROABS  --  5.5  HGB 14.8 14.9  HCT 45.6 47.0*  MCV 88.9 90.7  PLT 109* 107*    Cardiac Enzymes: No results for input(s): CKTOTAL, CKMB, CKMBINDEX, TROPONINI in the last 168 hours.  BNP: BNP (last 3 results) Recent Labs    05/27/21 1044 07/12/21 1018 07/15/21 1429  BNP 928.3* 667.6* 671.5*    ProBNP (last 3 results) Recent Labs    04/22/21 1147  PROBNP 3,347*     CBG: No results for input(s): GLUCAP in the last 168 hours.  Coagulation Studies: No results for input(s): LABPROT, INR in the last 72 hours.  Imaging: No results found.   Patient Profile  45 yo female with h/o obesity, COPD with ongoing tobacco use, DM2, polysubstance abuse including cocaine and IV,opioid use, HCV, chronic diastolic HF, mitral valve endocarditis with severe MR/moderate MS and severe pulmonary HTN and  MVR.   Admit with A/C HFpE  Assessment/Plan   1. Acute on Chronic Diastolic HF due to severe mitral valve disease -TEE 04/29/2021 EF 60-65%, normal RV size and function, Severe MR,  Mod MS likely burnt out vegetation on both leaflets, TR mild-mod - NYHA IIIb-IV with R>L HF - Marked volume overload despite increased home diuretic regimen.  - GIve 80 mg IV lasix x1 then start lasix drip at 15 mg per hour.  - Continue spironolactone 12.5 daily - Check BMET today  - Suspect she may need inotrope support. Place PICC - Place UNNA boots    2. Pulmonary HTN - multifactorial, based on current workup primarily group 2 and 3  -  She will need optimization of her inhaler regimen as well as a sleep study - She is seeing Dr. Blenda Nicely  - Stressed need to stop smoking   3. Mitral valve disease, severe -TEE 04/29/2021 EF 60-65%, normal RV size and function, Mod  MS/Severe MR, likely burnt out vegetation on both leaflets, TR mild-mod - Ideally she will need to valve replaced but currently not a surgical candidate - Has seen Dr. Laneta Simmers and agrees with need for surgical MVR but has to stop smoking completely.  - With markedly elevated PCW on recent cath will not start selective pulmonary artery vasodilators at this time - As above will diurese with IV lasix.  - Will d/w Dr. Laneta Simmers regarding timing of surgery. May be best to do while in house as she won't be smoking and may be hard to keep her tuned up as outpatient.    4. COPD with ongoing tobacco use - Following with Dr. Blenda Nicely - Discussed smoking cessation   5. Opioid Use Disorder -sees Dr. Nedra Hai in [place] who prescribes suboxone -has not used IV opiates or cocaine in 2 years per her report - Discussed with pharmacy .,    6. HCV -needs ID follow up eventually    Arvilla Meres, MD 07/15/2021, 5:12 PM  Advanced Heart Failure Team Pager [phone] (M-F; 7a - 5p)  Please contact [facility] for night-coverage after hours (4p -7a ) and weekends on [email or web address]

## 2021-07-15 NOTE — Progress Notes (Signed)
Spoke with Primary RN re: PICC order, it will be done tom 11/26. Patient has 1 PIV with lasix drip infusing well per RN. Will follow up in am.

## 2021-07-15 NOTE — TOC Initial Note (Signed)
Transition of Care [facility]) - Initial/Assessment Note    Patient Details  Name: Emily Larson MRN: [id number] Date of Birth: 04/04/1976  [facility] CM/SW Contact:    Lockie Pares, RN Phone Number: 07/15/2021, 3:19 PM  Clinical Narrative:                 45 year old history of IV drug use, endocarditis, smoker and diastolic CHF- last echo showed normal  EF severe MR hemodynamics show  ( PA 100/40) with a PCWP of 28. She was in the hospital at [facility] for a month. Has tried to keep her weight steady, has lost weight since diuresed. Is now presenting with weight gain and edema.  She has been referred to  [facility], previously and was not a candidate at that time for MVR. Currently not on oxygen and direct admitted from clinic.  CM will follow for needs, recommendations, and transitions.    Expected Discharge Plan: Home/Self Care Barriers to Discharge: Continued Medical Work up   Patient Goals and CMS Choice        Expected Discharge Plan and Services Expected Discharge Plan: Home/Self Care   Discharge Planning Services: CM Consult   Living arrangements for the past 2 months: Single Family Home                                      Prior Living Arrangements/Services Living arrangements for the past 2 months: Single Family Home   Patient language and need for interpreter reviewed:: Yes        Need for Family Participation in Patient Care: Yes (Comment) Care giver support system in place?: Yes (comment)   Criminal Activity/Legal Involvement Pertinent to Current Situation/Hospitalization: No - Comment as needed  Activities of Daily Living      Permission Sought/Granted                  Emotional Assessment       Orientation: : Oriented to Self, Oriented to Place, Oriented to  Time, Oriented to Situation Alcohol / Substance Use: Tobacco Use Psych Involvement: No (comment)  Admission diagnosis:  Acute on chronic diastolic heart failure (HCC)  [S85.46] Patient Active Problem List   Diagnosis Date Noted   Acute on chronic diastolic heart failure (HCC) 07/15/2021   Congestive heart failure (HCC) 03/31/2021   Heart failure due to valvular disease (HCC) 03/31/2021   Severe mitral regurgitation 03/31/2021   Moderate mitral stenosis 03/31/2021   Cigarette smoker 03/31/2021   Abdominal ascites 03/30/2021   Acute kidney injury (HCC) 03/30/2021   Anasarca 03/30/2021   Bacteremia due to methicillin susceptible Staphylococcus aureus (MSSA) 03/30/2021   CVA (cerebral vascular accident) (HCC) 03/30/2021   Diabetic foot ulcer (HCC) 03/30/2021   Dysuria 03/30/2021   Endocarditis due to methicillin susceptible Staphylococcus aureus (MSSA) 03/30/2021   Heart failure, acute diastolic (HCC) 03/30/2021   Heart failure, unspecified (HCC) 03/30/2021   Hyponatremia 03/30/2021   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   Lung mass 03/30/2021   Nauseated 03/30/2021   Pneumonia 03/30/2021   Tinea cruris 03/30/2021   History of endocarditis 03/30/2021   Allergy 03/30/2021   Arthropathy 03/30/2021   Bipolar 1 disorder (HCC) 03/30/2021   Chronic airway obstruction (HCC) 03/30/2021   Depression 03/30/2021   Diabetes mellitus without complication (HCC) 03/30/2021   Esophageal reflux 03/30/2021   Endocarditis 03/30/2021   Hepatitis 03/30/2021   Hyperlipidemia 03/30/2021  Hypertension 03/30/2021   Opioid abuse (HCC) 03/30/2021   Polyneuropathy 03/30/2021   History of intravenous drug abuse (HCC) 01/01/2020   Fluid retention 12/11/2019   Old cerebrovascular accident (CVA) without late effect 11/05/2018   Chronic viral hepatitis C (HCC) 05/05/2016   Mitral regurgitation 05/05/2016   Drug overdose 04/23/2016   Asthma exacerbation 04/23/2016   Acute respiratory failure with hypoxia and hypercapnia (HCC) 04/23/2016   IVDU (intravenous drug user) 04/23/2016   Transaminitis 04/23/2016   Alcohol intoxication (HCC) 04/23/2016   Overdose 04/23/2016   Cyst  of left ovary 01/20/2016   Hypomenorrhea/oligomenorrhea 01/20/2016   Tobacco abuse 06/30/2014   Confusion 06/28/2014   Unspecified visual loss 06/28/2014   Major depressive disorder, recurrent, in remission, unspecified (HCC) 06/12/2014   Endocarditis of mitral valve 06/08/2014   Osteomyelitis of toe of left foot (HCC) 06/08/2014   Chronic obstructive pulmonary disease, unspecified (HCC) 05/29/2014   Essential hypertension 05/29/2014   History of stroke 05/29/2014   Cervical radicular pain 12/27/2012   Chronic pain syndrome 12/27/2012   Low back pain, unspecified 12/27/2012   Neck pain 12/27/2012   PCP:  Adela Glimpse, NP Pharmacy:   [facility] [phone] - [place], [place] - [address] AT [facility] [address] [place] [address] Phone: [phone] Fax: [phone]  [id number] - [place], [place] - [address] AT [facility] [address] [place] [address] Phone: [phone] Fax: [phone]     Social Determinants of Health (SDOH) Interventions    Readmission Risk Interventions No flowsheet data found.

## 2021-07-15 NOTE — Plan of Care (Signed)
  Problem: Education: Goal: Knowledge of General Education information will improve Description: Including pain rating scale, medication(s)/side effects and non-pharmacologic comfort measures 07/15/2021 1838 by Excell Seltzer, Mally Gavina D, RN Outcome: Progressing 07/15/2021 1837 by Excell Seltzer, Brendyn Mclaren D, RN Outcome: Progressing   Problem: Clinical Measurements: Goal: Ability to maintain clinical measurements within normal limits will improve 07/15/2021 1838 by Excell Seltzer, Alfonsa Vaile D, RN Outcome: Progressing 07/15/2021 1837 by Billey Gosling D, RN Outcome: Progressing Goal: Will remain free from infection 07/15/2021 1838 by Excell Seltzer, Jniya Madara D, RN Outcome: Progressing 07/15/2021 1837 by Billey Gosling D, RN Outcome: Progressing Goal: Diagnostic test results will improve 07/15/2021 1838 by Excell Seltzer, Burton Gahan D, RN Outcome: Progressing 07/15/2021 1837 by Billey Gosling D, RN Outcome: Progressing Goal: Respiratory complications will improve 07/15/2021 1838 by Billey Gosling D, RN Outcome: Progressing 07/15/2021 1837 by Billey Gosling D, RN Outcome: Progressing Goal: Cardiovascular complication will be avoided 07/15/2021 1838 by Billey Gosling D, RN Outcome: Progressing 07/15/2021 1837 by Billey Gosling D, RN Outcome: Progressing   Problem: Activity: Goal: Risk for activity intolerance will decrease 07/15/2021 1838 by Billey Gosling D, RN Outcome: Progressing 07/15/2021 1837 by Billey Gosling D, RN Outcome: Progressing   Problem: Nutrition: Goal: Adequate nutrition will be maintained 07/15/2021 1838 by Excell Seltzer, Teleah Villamar D, RN Outcome: Progressing 07/15/2021 1837 by Excell Seltzer, Yoseline Andersson D, RN Outcome: Progressing   Problem: Coping: Goal: Level of anxiety will decrease 07/15/2021 1838 by Excell Seltzer, Jeremie Giangrande D, RN Outcome: Progressing 07/15/2021 1837 by Excell Seltzer, Lundynn Cohoon D, RN Outcome: Progressing   Problem: Elimination: Goal: Will not experience complications related to bowel motility 07/15/2021 1838 by Excell Seltzer, Canyon Lohr D,  RN Outcome: Progressing 07/15/2021 1837 by Excell Seltzer, Lively Haberman D, RN Outcome: Progressing Goal: Will not experience complications related to urinary retention 07/15/2021 1838 by Excell Seltzer, Fenix Rorke D, RN Outcome: Progressing 07/15/2021 1837 by Billey Gosling D, RN Outcome: Progressing   Problem: Pain Managment: Goal: General experience of comfort will improve 07/15/2021 1838 by Excell Seltzer, Brigitte Soderberg D, RN Outcome: Progressing 07/15/2021 1837 by Excell Seltzer, Emmily Pellegrin D, RN Outcome: Progressing   Problem: Safety: Goal: Ability to remain free from injury will improve 07/15/2021 1838 by Excell Seltzer, Jessye Imhoff D, RN Outcome: Progressing 07/15/2021 1837 by Excell Seltzer, Sobia Karger D, RN Outcome: Progressing   Problem: Skin Integrity: Goal: Risk for impaired skin integrity will decrease 07/15/2021 1838 by Excell Seltzer, Jahdai Padovano D, RN Outcome: Progressing 07/15/2021 1837 by Excell Seltzer, Oswin Griffith D, RN Outcome: Progressing   Problem: Education: Goal: Ability to demonstrate management of disease process will improve 07/15/2021 1838 by Excell Seltzer, Makalya Nave D, RN Outcome: Progressing 07/15/2021 1837 by Excell Seltzer, Ovidio Steele D, RN Outcome: Progressing Goal: Ability to verbalize understanding of medication therapies will improve 07/15/2021 1838 by Excell Seltzer, Trinadee Verhagen D, RN Outcome: Progressing 07/15/2021 1837 by Billey Gosling D, RN Outcome: Progressing Goal: Individualized Educational Video(s) 07/15/2021 1838 by Excell Seltzer, Bilan Tedesco D, RN Outcome: Progressing 07/15/2021 1837 by Billey Gosling D, RN Outcome: Progressing   Problem: Activity: Goal: Capacity to carry out activities will improve 07/15/2021 1838 by Excell Seltzer, Dwan Fennel D, RN Outcome: Progressing 07/15/2021 1837 by Excell Seltzer, Lynsie Mcwatters D, RN Outcome: Progressing   Problem: Cardiac: Goal: Ability to achieve and maintain adequate cardiopulmonary perfusion will improve 07/15/2021 1838 by Billey Gosling D, RN Outcome: Progressing 07/15/2021 1837 by Billey Gosling D, RN Outcome: Progressing

## 2021-07-15 NOTE — Plan of Care (Signed)

## 2021-07-15 NOTE — Progress Notes (Addendum)
   Pt arrived for admission.   VSS, not in acute distress. O2 sats 95% on room air.  Discussed fluid restrictions, diet.  No additional questions or concerns.  Home meds ordered, except for diuretics.  Theodore Demark, PA-C 07/15/2021 2:45 PM]

## 2021-07-15 NOTE — Telephone Encounter (Signed)
   Patient was seen by Dr. Gala Romney on November 22 in the setting of volume overload.  She had an adjustment to diuretics and was advised that if her weight was not down 15 pounds by this morning, to call in as she would likely require direct admission.  She notes that her weight did initially drop 7 pounds but then is back up 2 pounds this morning.  I advised that I will reach out to our advanced heart failure team and we will be back in touch this morning.  Caller verbalized understanding and was grateful for the call back.  Nicolasa Ducking, NP 07/15/2021, 10:00 AM

## 2021-07-16 ENCOUNTER — Inpatient Hospital Stay (HOSPITAL_COMMUNITY): Payer: Medicare Other

## 2021-07-16 DIAGNOSIS — I5033 Acute on chronic diastolic (congestive) heart failure: Secondary | ICD-10-CM | POA: Diagnosis not present

## 2021-07-16 LAB — BASIC METABOLIC PANEL
Anion gap: 9 (ref 5–15)
BUN: 22 mg/dL — ABNORMAL HIGH (ref 6–20)
CO2: 28 mmol/L (ref 22–32)
Calcium: 8.5 mg/dL — ABNORMAL LOW (ref 8.9–10.3)
Chloride: 96 mmol/L — ABNORMAL LOW (ref 98–111)
Creatinine, Ser: 1.31 mg/dL — ABNORMAL HIGH (ref 0.44–1.00)
GFR, Estimated: 52 mL/min — ABNORMAL LOW (ref >60)
Glucose, Bld: 322 mg/dL — ABNORMAL HIGH (ref 70–99)
Potassium: 3.5 mmol/L (ref 3.5–5.1)
Sodium: 133 mmol/L — ABNORMAL LOW (ref 135–145)

## 2021-07-16 LAB — COOXEMETRY PANEL
Carboxyhemoglobin: 1.9 % — ABNORMAL HIGH (ref 0.5–1.5)
Methemoglobin: 0.8 % (ref 0.0–1.5)
O2 Saturation: 64.8 %
Total hemoglobin: 15 g/dL (ref 12.0–16.0)

## 2021-07-16 LAB — MAGNESIUM: Magnesium: 2 mg/dL (ref 1.7–2.4)

## 2021-07-16 MED ORDER — SODIUM CHLORIDE 0.9% FLUSH
10.0000 mL | Freq: Two times a day (BID) | INTRAVENOUS | Status: DC
  Administered 2021-07-16 – 2021-07-23 (×11): 10 mL

## 2021-07-16 MED ORDER — CHLORHEXIDINE GLUCONATE CLOTH 2 % EX PADS
6.0000 | MEDICATED_PAD | Freq: Every day | CUTANEOUS | Status: DC
  Administered 2021-07-17 – 2021-07-23 (×7): 6 via TOPICAL

## 2021-07-16 MED ORDER — METOLAZONE 2.5 MG PO TABS
2.5000 mg | ORAL_TABLET | Freq: Once | ORAL | Status: AC
  Administered 2021-07-16: 2.5 mg via ORAL
  Filled 2021-07-16: qty 1

## 2021-07-16 MED ORDER — SODIUM CHLORIDE 0.9% FLUSH: 10.0000 mL | INTRAVENOUS | Status: DC | PRN

## 2021-07-16 NOTE — Progress Notes (Signed)
Assessed PICC insertion site. Immediately after PICC placed. Pt got out of bed to use the bathroom.  PICC site does not appear to be continuing to bleed, appears to have stopped.  Dressing change to be done 07/18/21.Pt verbalizes understanding.  No pressure dressing currently applied to dressing, but no active bleeding noted.

## 2021-07-16 NOTE — Progress Notes (Signed)
Patient ID: Emily Larson, female   DOB: Mar 05, 1976, 45 y.o.   MRN: [id number]     Advanced Heart Failure Rounding Note  PCP-Cardiologist: None   Subjective:    Modest diuresis yesterday, weight down 2 lbs.  Remains on Lasix gtt 15 mg/hr.  Creatinine mildly higher 1.3.    Says she feels a bit better.    Objective:   Weight Range: 88 kg Body mass index is 30.39 kg/m.   Vital Signs:   Temp:  [97.6 F (36.4 C)-98.1 F (36.7 C)] 98 F (36.7 C) (11/26 0731) Pulse Rate:  [61-73] 73 (11/26 0731) Resp:  [15-20] 20 (11/26 0731) BP: (96-120)/(60-86) 112/71 (11/26 0731) SpO2:  [92 %-96 %] 95 % (11/26 0908) Weight:  [88 kg-89.1 kg] 88 kg (11/26 0524)    Weight change: Filed Weights   07/15/21 1420 07/16/21 0524  Weight: 89.1 kg 88 kg    Intake/Output:   Intake/Output Summary (Last 24 hours) at 07/16/2021 0917 Last data filed at 07/16/2021 0422 Gross per 24 hour  Intake 994.9 ml  Output 2700 ml  Net -1705.1 ml      Physical Exam    General:  Well appearing. No resp difficulty HEENT: Normal Neck: Supple. JVP 16 cm. Carotids 2+ bilat; no bruits. No lymphadenopathy or thyromegaly appreciated. Cor: PMI nondisplaced. Regular rate & rhythm. 3/6 HSM apex. Lungs: Clear Abdomen: Soft, nontender, nondistended. No hepatosplenomegaly. No bruits or masses. Good bowel sounds. Extremities: No cyanosis, clubbing, rash. 1+ ankle edema.  Neuro: Alert & orientedx3, cranial nerves grossly intact. moves all 4 extremities w/o difficulty. Affect pleasant   Telemetry   NSR 60s (personally reviewed)  Labs    CBC Recent Labs    07/15/21 1429  WBC 7.5  NEUTROABS 5.5  HGB 14.9  HCT 47.0*  MCV 90.7  PLT 107*   Basic Metabolic Panel Recent Labs    40/98/11 1429 07/16/21 0118  NA 137 133*  K 3.5 3.5  CL 96* 96*  CO2 30 28  GLUCOSE 253* 322*  BUN 20 22*  CREATININE 1.11* 1.31*  CALCIUM 8.9 8.5*  MG 2.0 2.0   Liver Function Tests Recent Labs    07/15/21 1429  AST 20   ALT 13  ALKPHOS 100  BILITOT 1.3*  PROT 7.6  ALBUMIN 3.9   No results for input(s): LIPASE, AMYLASE in the last 72 hours. Cardiac Enzymes No results for input(s): CKTOTAL, CKMB, CKMBINDEX, TROPONINI in the last 72 hours.  BNP: BNP (last 3 results) Recent Labs    05/27/21 1044 07/12/21 1018 07/15/21 1429  BNP 928.3* 667.6* 671.5*    ProBNP (last 3 results) Recent Labs    04/22/21 1147  PROBNP 3,347*     D-Dimer No results for input(s): DDIMER in the last 72 hours. Hemoglobin A1C No results for input(s): HGBA1C in the last 72 hours. Fasting Lipid Panel No results for input(s): CHOL, HDL, LDLCALC, TRIG, CHOLHDL, LDLDIRECT in the last 72 hours. Thyroid Function Tests Recent Labs    07/15/21 1429  TSH 1.964    Other results:   Imaging    DG Chest Port 1 View  Result Date: 07/16/2021 CLINICAL DATA:  Shortness of breath EXAM: PORTABLE CHEST 1 VIEW COMPARISON:  01/25/2021 and prior studies FINDINGS: Cardiomegaly with pulmonary vascular congestion, mild interstitial opacities/edema and small RIGHT pleural effusion are noted. A RIGHT IJ central venous catheter has been removed. No pneumothorax. IMPRESSION: Cardiomegaly with pulmonary vascular congestion, mild interstitial opacities/edema and small RIGHT pleural effusion. Electronically Signed  By: Harmon Pier M.D.   On: 07/16/2021 08:29   [place] EKG SITE RITE  Result Date: 07/15/2021 If Site Rite image not attached, placement could not be confirmed due to current cardiac rhythm.    Medications:     Scheduled Medications:  buprenorphine-naloxone  1 tablet Sublingual BID   cariprazine  1.5 mg Oral Daily   enoxaparin (LOVENOX) injection  40 mg Subcutaneous Q24H   famotidine  40 mg Oral Daily   famotidine  40 mg Oral Daily   fluticasone furoate-vilanterol  1 puff Inhalation Daily   metolazone  2.5 mg Oral Once   nicotine  21 mg Transdermal Daily   potassium chloride  40 mEq Oral BID   pregabalin  150 mg Oral  TID   Semaglutide  3 mg Oral q AM   sodium chloride flush  3 mL Intravenous Q12H   spironolactone  12.5 mg Oral Daily   umeclidinium bromide  1 puff Inhalation Daily    Infusions:  sodium chloride     furosemide (LASIX) 200 mg in dextrose 5% 100 mL (2mg /mL) infusion 15 mg/hr (07/16/21 0320)    PRN Medications: sodium chloride, acetaminophen, albuterol, ondansetron (ZOFRAN) IV, sodium chloride flush    Assessment/Plan   1. Acute on Chronic Diastolic HF due to severe mitral valve disease -TEE 04/29/2021 EF 60-65%, normal RV size and function, Severe MR,  Mod MS likely burnt out vegetation on both leaflets, TR mild-mod - NYHA IIIb-IV with R>L HF - Marked volume overload despite increased home diuretic regimen.  - On Lasix 15 mg/hr, weight down 2 lbs.  Will continue and give dose of metolazone 2.5 x 1.  - Continue spironolactone 12.5 daily - Replace K.  - To get PICC this morning, send co-ox and follow CVP.  Milrinone may be needed with severe MR.  - UNNA boots    2. Pulmonary HTN - multifactorial, based on current workup primarily group 2 and 3  - She will need optimization of her inhaler regimen as well as a sleep study - She is seeing Dr. 06/29/2021  - Stressed need to stop smoking   3. Mitral valve disease, severe -TEE 04/29/2021 EF 60-65%, normal RV size and function, Mod MS/Severe MR, likely burnt out vegetation on both leaflets, TR mild-mod - Ideally she will need to valve replaced but currently not a surgical candidate - Has seen Dr. 06/29/2021 and agrees with need for surgical MVR but has to stop smoking completely. - With markedly elevated PCWP on recent cath will not start selective pulmonary artery vasodilators at this time - As above will diurese with IV lasix and metolazone.  - Will d/w Dr. Laneta Simmers regarding timing of surgery. May be best to do while in house as she won't be smoking and may be hard to keep her tuned up as outpatient.    4. COPD with ongoing tobacco use -  Following with Dr. Laneta Simmers - Discussed smoking cessation    5. Opioid Use Disorder -sees Dr. Blenda Nicely in [place] who prescribes suboxone -has not used IV opiates or cocaine in 2 years per her report - Discussed with pharmacy .,    6. HCV -needs ID follow up eventually  Length of Stay: 1  Baldwin park, MD  07/16/2021, 9:17 AM  Advanced Heart Failure Team Pager [phone] (M-F; 7a - 5p)  Please contact [facility] for night-coverage after hours (5p -7a ) and weekends on [email or web address]

## 2021-07-16 NOTE — Progress Notes (Signed)
Pt eating breakfast at this time, requests to wait 30 minutes for PICC placement.

## 2021-07-16 NOTE — Progress Notes (Signed)
Peripherally Inserted Central Catheter Placement  The IV Nurse has discussed with the patient and/or persons authorized to consent for the patient, the purpose of this procedure and the potential benefits and risks involved with this procedure.  The benefits include less needle sticks, lab draws from the catheter, and the patient may be discharged home with the catheter. Risks include, but not limited to, infection, bleeding, blood clot (thrombus formation), and puncture of an artery; nerve damage and irregular heartbeat and possibility to perform a PICC exchange if needed/ordered by physician.  Alternatives to this procedure were also discussed.  Bard Power PICC patient education guide, fact sheet on infection prevention and patient information card has been provided to patient /or left at bedside.    PICC Placement Documentation  PICC Double Lumen 07/16/21 PICC Right Basilic 36 cm 0 cm (Active)  Indication for Insertion or Continuance of Line Vasoactive infusions;Chronic illness with exacerbations (CF, Sickle Cell, etc.) 07/16/21 0956  Exposed Catheter (cm) 0 cm 07/16/21 0956  Site Assessment Clean;Dry;Intact 07/16/21 0956  Lumen #1 Status Flushed;Saline locked;Blood return noted 07/16/21 0956  Lumen #2 Status Flushed;Saline locked;Blood return noted 07/16/21 0956  Dressing Type Transparent;Securing device 07/16/21 0956  Dressing Status Clean;Dry;Intact 07/16/21 0956  Antimicrobial disc in place? Yes 07/16/21 0956  Safety Lock Not Applicable 07/16/21 0956  Line Care Connections checked and tightened 07/16/21 0956  Line Adjustment (NICU/IV Team Only) No 07/16/21 0956  Dressing Intervention New dressing 07/16/21 0956  Dressing Change Due 07/23/21 07/16/21 0956       Elliot Dally 07/16/2021, 9:57 AM

## 2021-07-16 NOTE — Progress Notes (Signed)
Orthopedic Tech Progress Note Patient Details:  Emily Larson 19-Nov-1975 [id number]  Ortho Devices Type of Ortho Device: Radio broadcast assistant Ortho Device/Splint Location: BLE Ortho Device/Splint Interventions: Ordered, Application, Adjustment   Post Interventions Patient Tolerated: Well  Darleen Crocker 07/16/2021, 1:24 PM

## 2021-07-17 DIAGNOSIS — I5033 Acute on chronic diastolic (congestive) heart failure: Secondary | ICD-10-CM | POA: Diagnosis not present

## 2021-07-17 LAB — BASIC METABOLIC PANEL
Anion gap: 9 (ref 5–15)
BUN: 22 mg/dL — ABNORMAL HIGH (ref 6–20)
CO2: 31 mmol/L (ref 22–32)
Calcium: 9.2 mg/dL (ref 8.9–10.3)
Chloride: 94 mmol/L — ABNORMAL LOW (ref 98–111)
Creatinine, Ser: 1.04 mg/dL — ABNORMAL HIGH (ref 0.44–1.00)
GFR, Estimated: >60 mL/min (ref >60)
Glucose, Bld: 272 mg/dL — ABNORMAL HIGH (ref 70–99)
Potassium: 4 mmol/L (ref 3.5–5.1)
Sodium: 134 mmol/L — ABNORMAL LOW (ref 135–145)

## 2021-07-17 LAB — COOXEMETRY PANEL
Carboxyhemoglobin: 1.7 % — ABNORMAL HIGH (ref 0.5–1.5)
Methemoglobin: 0.7 % (ref 0.0–1.5)
O2 Saturation: 63.1 %
Total hemoglobin: 15.1 g/dL (ref 12.0–16.0)

## 2021-07-17 LAB — GLUCOSE, CAPILLARY
Glucose-Capillary: 265 mg/dL — ABNORMAL HIGH (ref 70–99)
Glucose-Capillary: 183 mg/dL — ABNORMAL HIGH (ref 70–99)

## 2021-07-17 MED ORDER — INSULIN ASPART 100 UNIT/ML IJ SOLN
0.0000 [IU] | Freq: Every day | INTRAMUSCULAR | Status: DC
  Administered 2021-07-18: 22:00:00 4 [IU] via SUBCUTANEOUS
  Administered 2021-07-19: 2 [IU] via SUBCUTANEOUS
  Administered 2021-07-20: 3 [IU] via SUBCUTANEOUS
  Administered 2021-07-21 – 2021-07-22 (×2): 5 [IU] via SUBCUTANEOUS

## 2021-07-17 MED ORDER — METOLAZONE 2.5 MG PO TABS
2.5000 mg | ORAL_TABLET | Freq: Once | ORAL | Status: AC
  Administered 2021-07-17: 11:00:00 2.5 mg via ORAL
  Filled 2021-07-17: qty 1

## 2021-07-17 MED ORDER — INSULIN ASPART 100 UNIT/ML IJ SOLN
0.0000 [IU] | Freq: Three times a day (TID) | INTRAMUSCULAR | Status: DC
  Administered 2021-07-17: 17:00:00 8 [IU] via SUBCUTANEOUS
  Administered 2021-07-17: 12:00:00 2 [IU] via SUBCUTANEOUS
  Administered 2021-07-18: 07:00:00 8 [IU] via SUBCUTANEOUS
  Administered 2021-07-18 – 2021-07-19 (×2): 11 [IU] via SUBCUTANEOUS
  Administered 2021-07-19: 15 [IU] via SUBCUTANEOUS

## 2021-07-17 MED ORDER — LIVING WELL WITH DIABETES BOOK
Freq: Once | Status: AC
  Filled 2021-07-17: qty 1

## 2021-07-17 MED ORDER — INSULIN STARTER KIT- PEN NEEDLES (ENGLISH)
1.0000 | Freq: Once | Status: AC
  Administered 2021-07-17: 17:00:00 1
  Filled 2021-07-17: qty 1

## 2021-07-17 NOTE — Plan of Care (Signed)

## 2021-07-17 NOTE — Progress Notes (Signed)
Patient ID: Emily Larson, female   DOB: [birth year], 45 y.o.   MRN: [id number]     Advanced Heart Failure Rounding Note  PCP-Cardiologist: None   Subjective:    Good diuresis yesterday, weight down 5 lbs.  Remains on Lasix gtt 15 mg/hr.  Creatinine stable 1.04.  CVP 14-15.  Breathing getting better.    Objective:   Weight Range: 85.8 kg Body mass index is 29.63 kg/m.   Vital Signs:   Temp:  [97.7 F (36.5 C)-97.9 F (36.6 C)] 97.9 F (36.6 C) (11/27 0737) Pulse Rate:  [61-74] 61 (11/27 0346) Resp:  [11-25] 16 (11/27 0737) BP: (101-112)/(62-80) 101/63 (11/27 0737) SpO2:  [90 %-98 %] 94 % (11/27 0747) Weight:  [85.8 kg] 85.8 kg (11/27 0346)    Weight change: Filed Weights   07/15/21 1420 07/16/21 0524 07/17/21 0346  Weight: 89.1 kg 88 kg 85.8 kg    Intake/Output:   Intake/Output Summary (Last 24 hours) at 07/17/2021 0908 Last data filed at 07/17/2021 0415 Gross per 24 hour  Intake 1468.48 ml  Output 5775 ml  Net -4306.52 ml      Physical Exam    General: NAD Neck: JVP 14 cm, no thyromegaly or thyroid nodule.  Lungs: Clear to auscultation bilaterally with normal respiratory effort. CV: Nondisplaced PMI.  Heart regular S1/S2, no S3/S4, 2/6 HSM apex.  1+ ankle edema.  Abdomen: Soft, nontender, no hepatosplenomegaly, no distention.  Skin: Intact without lesions or rashes.  Neurologic: Alert and oriented x 3.  Psych: Normal affect. Extremities: No clubbing or cyanosis.  HEENT: Normal.    Telemetry   NSR 60s (personally reviewed)  Labs    CBC Recent Labs    07/15/21 1429  WBC 7.5  NEUTROABS 5.5  HGB 14.9  HCT 47.0*  MCV 90.7  PLT 107*   Basic Metabolic Panel Recent Labs    40/98/11 1429 07/16/21 0118 07/17/21 0450  NA 137 133* 134*  K 3.5 3.5 4.0  CL 96* 96* 94*  CO2 30 28 31   GLUCOSE 253* 322* 272*  BUN 20 22* 22*  CREATININE 1.11* 1.31* 1.04*  CALCIUM 8.9 8.5* 9.2  MG 2.0 2.0  --    Liver Function Tests Recent Labs     07/15/21 1429  AST 20  ALT 13  ALKPHOS 100  BILITOT 1.3*  PROT 7.6  ALBUMIN 3.9   No results for input(s): LIPASE, AMYLASE in the last 72 hours. Cardiac Enzymes No results for input(s): CKTOTAL, CKMB, CKMBINDEX, TROPONINI in the last 72 hours.  BNP: BNP (last 3 results) Recent Labs    05/27/21 1044 07/12/21 1018 07/15/21 1429  BNP 928.3* 667.6* 671.5*    ProBNP (last 3 results) Recent Labs    04/22/21 1147  PROBNP 3,347*     D-Dimer No results for input(s): DDIMER in the last 72 hours. Hemoglobin A1C No results for input(s): HGBA1C in the last 72 hours. Fasting Lipid Panel No results for input(s): CHOL, HDL, LDLCALC, TRIG, CHOLHDL, LDLDIRECT in the last 72 hours. Thyroid Function Tests Recent Labs    07/15/21 1429  TSH 1.964    Other results:   Imaging    No results found.   Medications:     Scheduled Medications:  buprenorphine-naloxone  1 tablet Sublingual BID   cariprazine  1.5 mg Oral Daily   Chlorhexidine Gluconate Cloth  6 each Topical Daily   enoxaparin (LOVENOX) injection  40 mg Subcutaneous Q24H   famotidine  40 mg Oral Daily   famotidine  40 mg Oral Daily   fluticasone furoate-vilanterol  1 puff Inhalation Daily   nicotine  21 mg Transdermal Daily   potassium chloride  40 mEq Oral BID   pregabalin  150 mg Oral TID   Semaglutide  3 mg Oral q AM   sodium chloride flush  10-40 mL Intracatheter Q12H   sodium chloride flush  3 mL Intravenous Q12H   spironolactone  12.5 mg Oral Daily   umeclidinium bromide  1 puff Inhalation Daily    Infusions:  sodium chloride     furosemide (LASIX) 200 mg in dextrose 5% 100 mL (2mg /mL) infusion 15 mg/hr (07/16/21 1655)    PRN Medications: sodium chloride, acetaminophen, albuterol, ondansetron (ZOFRAN) IV, sodium chloride flush, sodium chloride flush    Assessment/Plan   1. Acute on Chronic Diastolic HF due to severe mitral valve disease -TEE 04/29/2021 EF 60-65%, normal RV size and function,  Severe MR,  Mod MS likely burnt out vegetation on both leaflets, TR mild-mod - NYHA IIIb-IV with R>L HF - Marked volume overload despite increased home diuretic regimen.  - On Lasix 15 mg/hr, weight down 5 lbs.  CVP 14-15.  Will continue IV Lasix and give dose of metolazone 2.5 x 1 again.  - Continue spironolactone 12.5 daily - Replace K.  - Co-ox 63%, did not start milrinone.  - UNNA boots    2. Pulmonary HTN - multifactorial, based on current workup primarily group 2 and 3  - She will need optimization of her inhaler regimen as well as a sleep study - She is seeing Dr. 06/29/2021  - Stressed need to stop smoking   3. Mitral valve disease, severe -TEE 04/29/2021 EF 60-65%, normal RV size and function, Mod MS/Severe MR, likely burnt out vegetation on both leaflets, TR mild-mod - Ideally she will need to valve replaced but currently not a surgical candidate - Has seen Dr. 06/29/2021 and agrees with need for surgical MVR but has to stop smoking completely. - With markedly elevated PCWP on recent cath will not start selective pulmonary artery vasodilators at this time - As above will diurese with IV lasix and metolazone.  - Will d/w Dr. Laneta Simmers regarding timing of surgery. May be best to do while in house as she won't be smoking and may be hard to keep her tuned up as outpatient.    4. COPD with ongoing tobacco use - Following with Dr. Laneta Simmers - Discussed smoking cessation    5. Opioid Use Disorder -sees Dr. Blenda Nicely in [place] who prescribes suboxone -has not used IV opiates or cocaine in 2 years per her report    6. HCV -needs ID follow up eventually  Ambulate  Length of Stay: 2  Baldwin park, MD  07/17/2021, 9:08 AM  Advanced Heart Failure Team Pager [phone] (M-F; 7a - 5p)  Please contact [facility] for night-coverage after hours (5p -7a ) and weekends on [email or web address]

## 2021-07-17 NOTE — Progress Notes (Signed)
Inpatient Diabetes Program Recommendations  AACE/ADA: New Consensus Statement on Inpatient Glycemic Control (2015)  Target Ranges:  Prepandial:   less than 140 mg/dL      Peak postprandial:   less than 180 mg/dL (1-2 hours)      Critically ill patients:  140 - 180 mg/dL   Lab Results  Component Value Date   GLUCAP 140 (H) 04/25/2016   HGBA1C 9.0 (H) 04/25/2016    Review of Glycemic Control  Diabetes history: DM2 Outpatient Diabetes medications: Semaglutide 3 mg qd Current orders for Inpatient glycemic control: Novolog correction 0-15 units tid + hs 0-5 units Semaglutide 3 mg qd  Pending A1c  Inpatient Diabetes Program Recommendations:   Please consider: -Semglee 10 units qd starting today (0.15 units/kg x 85.8 kg = 13 units) Will followup for diabetes needs 11/28. Ordered Living Well with Diabetes booklet & insulin pen starter kit.  Thank you, Nani Gasser. Dequane Strahan, RN, MSN, CDE  Diabetes Coordinator Inpatient Glycemic Control Team Team Pager [phone] (8am-5pm) 07/17/2021 2:40 PM

## 2021-07-17 NOTE — Plan of Care (Signed)
  Problem: Education: Goal: Knowledge of General Education information will improve Description: Including pain rating scale, medication(s)/side effects and non-pharmacologic comfort measures Outcome: Progressing   Problem: Health Behavior/Discharge Planning: Goal: Ability to manage health-related needs will improve Outcome: Progressing   Problem: Activity: Goal: Risk for activity intolerance will decrease Outcome: Progressing   Problem: Nutrition: Goal: Adequate nutrition will be maintained Outcome: Progressing   Problem: Education: Goal: Ability to demonstrate management of disease process will improve Outcome: Progressing

## 2021-07-18 ENCOUNTER — Other Ambulatory Visit (HOSPITAL_COMMUNITY): Payer: Medicare Other

## 2021-07-18 DIAGNOSIS — I5033 Acute on chronic diastolic (congestive) heart failure: Secondary | ICD-10-CM | POA: Diagnosis not present

## 2021-07-18 LAB — GLUCOSE, CAPILLARY
Glucose-Capillary: 286 mg/dL — ABNORMAL HIGH (ref 70–99)
Glucose-Capillary: 312 mg/dL — ABNORMAL HIGH (ref 70–99)
Glucose-Capillary: 110 mg/dL — ABNORMAL HIGH (ref 70–99)
Glucose-Capillary: 303 mg/dL — ABNORMAL HIGH (ref 70–99)

## 2021-07-18 LAB — BASIC METABOLIC PANEL
Anion gap: 8 (ref 5–15)
BUN: 30 mg/dL — ABNORMAL HIGH (ref 6–20)
CO2: 33 mmol/L — ABNORMAL HIGH (ref 22–32)
Calcium: 9.7 mg/dL (ref 8.9–10.3)
Chloride: 93 mmol/L — ABNORMAL LOW (ref 98–111)
Creatinine, Ser: 1.16 mg/dL — ABNORMAL HIGH (ref 0.44–1.00)
GFR, Estimated: >60 mL/min (ref >60)
Glucose, Bld: 266 mg/dL — ABNORMAL HIGH (ref 70–99)
Potassium: 4.3 mmol/L (ref 3.5–5.1)
Sodium: 134 mmol/L — ABNORMAL LOW (ref 135–145)

## 2021-07-18 LAB — COOXEMETRY PANEL
Carboxyhemoglobin: 1.9 % — ABNORMAL HIGH (ref 0.5–1.5)
Methemoglobin: 0.7 % (ref 0.0–1.5)
O2 Saturation: 55.8 %
Total hemoglobin: 15.6 g/dL (ref 12.0–16.0)

## 2021-07-18 MED ORDER — SODIUM CHLORIDE 0.9% FLUSH
3.0000 mL | Freq: Two times a day (BID) | INTRAVENOUS | Status: DC
  Administered 2021-07-20 – 2021-07-22 (×4): 3 mL via INTRAVENOUS

## 2021-07-18 MED ORDER — ACETAZOLAMIDE 250 MG PO TABS
250.0000 mg | ORAL_TABLET | Freq: Two times a day (BID) | ORAL | Status: AC
  Administered 2021-07-18 (×2): 250 mg via ORAL
  Filled 2021-07-18 (×2): qty 1

## 2021-07-18 MED ORDER — INSULIN GLARGINE-YFGN 100 UNIT/ML ~~LOC~~ SOLN
10.0000 [IU] | Freq: Every day | SUBCUTANEOUS | Status: DC
Start: 2021-07-18 — End: 2021-07-19
  Administered 2021-07-18 – 2021-07-19 (×2): 10 [IU] via SUBCUTANEOUS
  Filled 2021-07-18 (×2): qty 0.1

## 2021-07-18 MED ORDER — POTASSIUM CHLORIDE CRYS ER 20 MEQ PO TBCR
30.0000 meq | EXTENDED_RELEASE_TABLET | Freq: Two times a day (BID) | ORAL | Status: DC
  Administered 2021-07-18 – 2021-07-19 (×4): 30 meq via ORAL
  Filled 2021-07-18 (×5): qty 1

## 2021-07-18 NOTE — Care Management Important Message (Signed)
Important Message  Patient Details  Name: Emily Larson MRN: [id number] Date of Birth: [birth year]   Medicare Important Message Given:  Yes     Jaydence Arnesen 07/18/2021, 2:57 PM

## 2021-07-18 NOTE — Progress Notes (Signed)
Mobility Specialist Progress Note    07/18/21 1015  Mobility  Activity Ambulated in hall  Level of Assistance Modified independent, requires aide device or extra time  Assistive Device  (IV pole)  Distance Ambulated (ft) 815 ft  Mobility Ambulated independently in hallway  Mobility Response Tolerated well  Mobility performed by Mobility specialist  Bed Position Chair  $Mobility charge 1 Mobility   Pt received in room and agreeable. C/o some wooziness but stated it was improving during walk. Returned to chair with call bell in reach.   [facility] Mobility Specialist  M.S. Primary Phone: [phone] M.S. Secondary Phone: [phone]

## 2021-07-18 NOTE — Progress Notes (Addendum)
Patient ID: Emily Larson, female   DOB: 30-Sep-1975, 45 y.o.   MRN: [id number]     Advanced Heart Failure Rounding Note  PCP-Cardiologist: None   Subjective:    Good diuresis again yesterday w/ 4L in UOP. Wt down 6 lb. CVP 7-8  SCr 1.31>>1.04>>1.16 K 4.3  Co-ox down, 63>>56% today.   Breathing improving. Denies CP.    Objective:   Weight Range: 83.2 kg Body mass index is 28.73 kg/m.   Vital Signs:   Temp:  [97.5 F (36.4 C)-98.1 F (36.7 C)] 97.6 F (36.4 C) (11/28 0736) Pulse Rate:  [63-67] 63 (11/28 0736) Resp:  [12-17] 15 (11/28 0736) BP: (102-116)/(65-79) 109/65 (11/28 0736) SpO2:  [91 %-99 %] 91 % (11/28 0736) Weight:  [83.2 kg] 83.2 kg (11/28 0446) Last BM Date: 07/17/21  Weight change: Filed Weights   07/16/21 0524 07/17/21 0346 07/18/21 0446  Weight: 88 kg 85.8 kg 83.2 kg    Intake/Output:   Intake/Output Summary (Last 24 hours) at 07/18/2021 0746 Last data filed at 07/17/2021 2300 Gross per 24 hour  Intake 960 ml  Output 4000 ml  Net -3040 ml      Physical Exam    CVP 7-8   General:  Well appearing. No respiratory difficulty HEENT: normal Neck: supple. JVD 7 cm. Carotids 2+ bilat; no bruits. No lymphadenopathy or thyromegaly appreciated. Cor: PMI nondisplaced. Regular rate & rhythm. 2/6 HSM at apex. Lungs: clear Abdomen: soft, nontender, nondistended. No hepatosplenomegaly. No bruits or masses. Good bowel sounds. Extremities: no cyanosis, clubbing, rash, edema + RUE PICC  Neuro: alert & oriented x 3, cranial nerves grossly intact. moves all 4 extremities w/o difficulty. Affect pleasant.   Telemetry   NSR 60s (personally reviewed)  Labs    CBC Recent Labs    07/15/21 1429  WBC 7.5  NEUTROABS 5.5  HGB 14.9  HCT 47.0*  MCV 90.7  PLT 107*   Basic Metabolic Panel Recent Labs    73/22/02 1429 07/16/21 0118 07/17/21 0450 07/18/21 0450  NA 137 133* 134* 134*  K 3.5 3.5 4.0 4.3  CL 96* 96* 94* 93*  CO2 30 28 31  33*  GLUCOSE 253*  322* 272* 266*  BUN 20 22* 22* 30*  CREATININE 1.11* 1.31* 1.04* 1.16*  CALCIUM 8.9 8.5* 9.2 9.7  MG 2.0 2.0  --   --    Liver Function Tests Recent Labs    07/15/21 1429  AST 20  ALT 13  ALKPHOS 100  BILITOT 1.3*  PROT 7.6  ALBUMIN 3.9   No results for input(s): LIPASE, AMYLASE in the last 72 hours. Cardiac Enzymes No results for input(s): CKTOTAL, CKMB, CKMBINDEX, TROPONINI in the last 72 hours.  BNP: BNP (last 3 results) Recent Labs    05/27/21 1044 07/12/21 1018 07/15/21 1429  BNP 928.3* 667.6* 671.5*    ProBNP (last 3 results) Recent Labs    04/22/21 1147  PROBNP 3,347*     D-Dimer No results for input(s): DDIMER in the last 72 hours. Hemoglobin A1C No results for input(s): HGBA1C in the last 72 hours. Fasting Lipid Panel No results for input(s): CHOL, HDL, LDLCALC, TRIG, CHOLHDL, LDLDIRECT in the last 72 hours. Thyroid Function Tests Recent Labs    07/15/21 1429  TSH 1.964    Other results:   Imaging    No results found.   Medications:     Scheduled Medications:  buprenorphine-naloxone  1 tablet Sublingual BID   cariprazine  1.5 mg Oral Daily  Chlorhexidine Gluconate Cloth  6 each Topical Daily   enoxaparin (LOVENOX) injection  40 mg Subcutaneous Q24H   famotidine  40 mg Oral Daily   fluticasone furoate-vilanterol  1 puff Inhalation Daily   insulin aspart  0-15 Units Subcutaneous TID WC   insulin aspart  0-5 Units Subcutaneous QHS   nicotine  21 mg Transdermal Daily   potassium chloride  40 mEq Oral BID   pregabalin  150 mg Oral TID   Semaglutide  3 mg Oral q AM   sodium chloride flush  10-40 mL Intracatheter Q12H   sodium chloride flush  3 mL Intravenous Q12H   spironolactone  12.5 mg Oral Daily   umeclidinium bromide  1 puff Inhalation Daily    Infusions:  sodium chloride     furosemide (LASIX) 200 mg in dextrose 5% 100 mL (2mg /mL) infusion 15 mg/hr (07/18/21 0629)    PRN Medications: sodium chloride, acetaminophen,  albuterol, ondansetron (ZOFRAN) IV, sodium chloride flush, sodium chloride flush    Assessment/Plan   1. Acute on Chronic Diastolic HF due to severe mitral valve disease -TEE 04/29/2021 EF 60-65%, normal RV size and function, Severe MR,  Mod MS likely burnt out vegetation on both leaflets, TR mild-mod - NYHA IIIb-IV with R>L HF - Marked volume overload despite increased home diuretic regimen.  - On Lasix 15 mg/hr. Diuresing well. Wt down another 6 lb. 13 lb total. CVP 7-8  - Transition back to torsemide today, 60 mg bid  - Continue spironolactone 12.5 daily - Co-ox 56%. Follow closely, may need milrinone.  - UNNA boots    2. Pulmonary HTN - multifactorial, based on current workup primarily group 2 and 3  - She will need optimization of her inhaler regimen as well as a sleep study - She is seeing Dr. 06/29/2021  - Stressed need to stop smoking   3. Mitral valve disease, severe -TEE 04/29/2021 EF 60-65%, normal RV size and function, Mod MS/Severe MR, likely burnt out vegetation on both leaflets, TR mild-mod - Ideally she will need to valve replaced but currently not a surgical candidate - Has seen Dr. 06/29/2021 and agrees with need for surgical MVR but has to stop smoking completely. - With markedly elevated PCWP on recent cath will not start selective pulmonary artery vasodilators at this time - As above will good diuresis w/ IV Lasix. Transition to PO diuretics today  - Will d/w Dr. Laneta Simmers regarding timing of surgery. May be best to do while in house as she won't be smoking and may be hard to keep her tuned up as outpatient.    4. COPD with ongoing tobacco use - Following with Dr. Laneta Simmers - Discussed smoking cessation    5. Opioid Use Disorder -sees Dr. Blenda Nicely in [place] who prescribes suboxone -has not used IV opiates or cocaine in 2 years per her report    6. HCV -needs ID follow up eventually  7. Diabetes - last Hgb A1c 2017 was 9.0 - CBGs elevated 300s - Add long acting insulin -  Repeat A1c pending   Ambulate  Length of Stay: 3  Brittainy Simmons, PA-C  07/18/2021, 7:46 AM  Advanced Heart Failure Team Pager [phone] (M-F; 7a - 5p)  Please contact [facility] for night-coverage after hours (5p -7a ) and weekends on [email or web address]   Patient seen and examined with the above-signed Advanced Practice Provider and/or Housestaff. I personally reviewed laboratory data, imaging studies and relevant notes. I independently examined the patient and formulated the important  aspects of the plan. I have edited the note to reflect any of my changes or salient points. I have personally discussed the plan with the patient and/or family.  Continues to diurese. Weight down 13 pounds. Breathing better. Less bloated. No orthopnea or PND. CVP 7-8  SCr stable.   General:  Sitting up in bed. No resp difficulty HEENT: normal Neck: supple. JVP 7-8 Carotids 2+ bilat; no bruits. No lymphadenopathy or thryomegaly appreciated. Cor: PMI nondisplaced. Regular rate & rhythm.2/6 MR/TR Lungs: decreased throughout  Abdomen: obese soft, nontender, nondistended. No hepatosplenomegaly. No bruits or masses. Good bowel sounds. Extremities: no cyanosis, clubbing, rash, trace edema Neuro: alert & orientedx3, cranial nerves grossly intact. moves all 4 extremities w/o difficulty. Affect pleasant  Volume status improving. Would continue diuresis one more day and plan repeat RHC in am to reassess hemodynamics/pulmonary pressures. I d/w Dr. Laneta Simmers regarding possibility of MVR in next week or so while in house. We will look at schedule.   Arvilla Meres, MD  8:31 AM

## 2021-07-18 NOTE — Plan of Care (Signed)

## 2021-07-18 NOTE — Plan of Care (Signed)
  Problem: Education: Goal: Knowledge of General Education information will improve Description: Including pain rating scale, medication(s)/side effects and non-pharmacologic comfort measures Outcome: Progressing   Problem: Health Behavior/Discharge Planning: Goal: Ability to manage health-related needs will improve Outcome: Progressing   Problem: Clinical Measurements: Goal: Ability to maintain clinical measurements within normal limits will improve Outcome: Progressing   Problem: Activity: Goal: Risk for activity intolerance will decrease Outcome: Progressing   Problem: Elimination: Goal: Will not experience complications related to bowel motility Outcome: Progressing Goal: Will not experience complications related to urinary retention Outcome: Progressing   Problem: Pain Managment: Goal: General experience of comfort will improve Outcome: Progressing   Problem: Safety: Goal: Ability to remain free from injury will improve Outcome: Progressing

## 2021-07-19 ENCOUNTER — Inpatient Hospital Stay (HOSPITAL_COMMUNITY): Payer: Medicare Other

## 2021-07-19 DIAGNOSIS — I5033 Acute on chronic diastolic (congestive) heart failure: Secondary | ICD-10-CM | POA: Diagnosis not present

## 2021-07-19 DIAGNOSIS — Z01810 Encounter for preprocedural cardiovascular examination: Secondary | ICD-10-CM

## 2021-07-19 LAB — CBC
HCT: 53.2 % — ABNORMAL HIGH (ref 36.0–46.0)
Hemoglobin: 17.8 g/dL — ABNORMAL HIGH (ref 12.0–15.0)
MCH: 29.1 pg (ref 26.0–34.0)
MCHC: 33.5 g/dL (ref 30.0–36.0)
MCV: 87.1 fL (ref 80.0–100.0)
Platelets: 148 10*3/uL — ABNORMAL LOW (ref 150–400)
RBC: 6.11 MIL/uL — ABNORMAL HIGH (ref 3.87–5.11)
RDW: 15.3 % (ref 11.5–15.5)
WBC: 11.2 10*3/uL — ABNORMAL HIGH (ref 4.0–10.5)
nRBC: 0 % (ref 0.0–0.2)

## 2021-07-19 LAB — GLUCOSE, CAPILLARY
Glucose-Capillary: 333 mg/dL — ABNORMAL HIGH (ref 70–99)
Glucose-Capillary: 364 mg/dL — ABNORMAL HIGH (ref 70–99)
Glucose-Capillary: 105 mg/dL — ABNORMAL HIGH (ref 70–99)
Glucose-Capillary: 245 mg/dL — ABNORMAL HIGH (ref 70–99)

## 2021-07-19 LAB — COOXEMETRY PANEL
Carboxyhemoglobin: 1.6 % — ABNORMAL HIGH (ref 0.5–1.5)
Methemoglobin: 0.7 % (ref 0.0–1.5)
O2 Saturation: 62.3 %
Total hemoglobin: 17.7 g/dL — ABNORMAL HIGH (ref 12.0–16.0)

## 2021-07-19 LAB — BASIC METABOLIC PANEL
Anion gap: 11 (ref 5–15)
BUN: 44 mg/dL — ABNORMAL HIGH (ref 6–20)
CO2: 34 mmol/L — ABNORMAL HIGH (ref 22–32)
Calcium: 10.2 mg/dL (ref 8.9–10.3)
Chloride: 87 mmol/L — ABNORMAL LOW (ref 98–111)
Creatinine, Ser: 1.46 mg/dL — ABNORMAL HIGH (ref 0.44–1.00)
GFR, Estimated: 46 mL/min — ABNORMAL LOW (ref >60)
Glucose, Bld: 247 mg/dL — ABNORMAL HIGH (ref 70–99)
Potassium: 4 mmol/L (ref 3.5–5.1)
Sodium: 132 mmol/L — ABNORMAL LOW (ref 135–145)

## 2021-07-19 LAB — MAGNESIUM: Magnesium: 2.3 mg/dL (ref 1.7–2.4)

## 2021-07-19 MED ORDER — TRAMADOL HCL 50 MG PO TABS
ORAL_TABLET | ORAL | Status: AC
  Filled 2021-07-19: qty 1

## 2021-07-19 MED ORDER — INSULIN ASPART 100 UNIT/ML IJ SOLN
0.0000 [IU] | Freq: Three times a day (TID) | INTRAMUSCULAR | Status: DC
Start: 2021-07-19 — End: 2021-07-24
  Administered 2021-07-20: 1 [IU] via SUBCUTANEOUS
  Administered 2021-07-20 (×2): 3 [IU] via SUBCUTANEOUS
  Administered 2021-07-21: 5 [IU] via SUBCUTANEOUS
  Administered 2021-07-21: 3 [IU] via SUBCUTANEOUS
  Administered 2021-07-21: 5 [IU] via SUBCUTANEOUS
  Administered 2021-07-22: 9 [IU] via SUBCUTANEOUS
  Administered 2021-07-22: 5 [IU] via SUBCUTANEOUS
  Administered 2021-07-22 – 2021-07-23 (×2): 7 [IU] via SUBCUTANEOUS
  Administered 2021-07-23: 9 [IU] via SUBCUTANEOUS

## 2021-07-19 MED ORDER — TRAMADOL HCL 50 MG PO TABS
50.0000 mg | ORAL_TABLET | Freq: Three times a day (TID) | ORAL | Status: DC
  Administered 2021-07-19 – 2021-07-23 (×12): 50 mg via ORAL
  Filled 2021-07-19 (×11): qty 1

## 2021-07-19 MED ORDER — INSULIN GLARGINE-YFGN 100 UNIT/ML ~~LOC~~ SOLN
16.0000 [IU] | Freq: Every day | SUBCUTANEOUS | Status: DC
  Filled 2021-07-19: qty 0.16

## 2021-07-19 MED ORDER — INSULIN GLARGINE-YFGN 100 UNIT/ML ~~LOC~~ SOLN
6.0000 [IU] | Freq: Once | SUBCUTANEOUS | Status: AC
  Administered 2021-07-19: 6 [IU] via SUBCUTANEOUS
  Filled 2021-07-19: qty 0.06

## 2021-07-19 MED ORDER — INSULIN ASPART 100 UNIT/ML IJ SOLN
4.0000 [IU] | Freq: Three times a day (TID) | INTRAMUSCULAR | Status: DC
  Administered 2021-07-21 – 2021-07-22 (×4): 4 [IU] via SUBCUTANEOUS

## 2021-07-19 NOTE — Progress Notes (Signed)
0.2Inpatient Diabetes Program Recommendations  AACE/ADA: New Consensus Statement on Inpatient Glycemic Control (2015)  Target Ranges:  Prepandial:   less than 140 mg/dL      Peak postprandial:   less than 180 mg/dL (1-2 hours)      Critically ill patients:  140 - 180 mg/dL   Lab Results  Component Value Date   GLUCAP 333 (H) 07/19/2021   HGBA1C 9.0 (H) 04/25/2016    Review of Glycemic Control  Diabetes history: DM2 Outpatient Diabetes medications: Semaglutide 3 mg qd Current orders for Inpatient glycemic control: Semglee 10 units, Novolog correction 0-15 units tid + hs 0-5 units Semaglutide 3 mg qd  Inpatient Diabetes Program Recommendations:   Please consider: -Increase Semglee to 16 units daily (0.2 units/kg x 80.1 kg) -Decrease Novolog correction to 0-9 units tid + hs 0-5 units -Add Novolog 4 units tid meal coverage if eats 50% Secure chat sent to [employer] [facility].  Thank you, Billy Fischer. Tyronica Truxillo, RN, MSN, CDE  Diabetes Coordinator Inpatient Glycemic Control Team Team Pager [phone] (8am-5pm) 07/19/2021 10:49 AM

## 2021-07-19 NOTE — H&P (View-Only) (Signed)
Patient ID: Emily Larson, female   DOB: 04/09/1976, 45 y.o.   MRN: [id number]     Advanced Heart Failure Rounding Note  PCP-Cardiologist: None   Subjective:    Net negative 3.3L w/ IV Lasix yesterday. Wt down another 7 lb. 20 lb total. CVP 7   SCr 1.31>>1.04>>1.16>>1.46  K 4.0  Co-ox 62%   Denies dyspnea. Complaining of cramping in hands, feet and rib cage.  K 4.0  Mg pending    Objective:   Weight Range: 80.1 kg Body mass index is 27.66 kg/m.   Vital Signs:   Temp:  [97.6 F (36.4 C)-98 F (36.7 C)] 98 F (36.7 C) (11/29 0742) Pulse Rate:  [72-81] 81 (11/29 0408) Resp:  [13-18] 13 (11/29 0742) BP: (107-116)/(61-83) 113/83 (11/29 0742) SpO2:  [91 %-98 %] 97 % (11/29 0803) Weight:  [80.1 kg] 80.1 kg (11/29 0408) Last BM Date: 07/17/21  Weight change: Filed Weights   07/17/21 0346 07/18/21 0446 07/19/21 0408  Weight: 85.8 kg 83.2 kg 80.1 kg    Intake/Output:   Intake/Output Summary (Last 24 hours) at 07/19/2021 0849 Last data filed at 07/19/2021 0848 Gross per 24 hour  Intake 900 ml  Output 4800 ml  Net -3900 ml      Physical Exam   CVP 7  General:  Well appearing. No respiratory difficulty HEENT: normal Neck: supple. no JVD. Carotids 2+ bilat; no bruits. No lymphadenopathy or thyromegaly appreciated. Cor: PMI nondisplaced. Regular rate & rhythm. 3/6 MR murmur  Lungs: clear Abdomen: soft, nontender, nondistended. No hepatosplenomegaly. No bruits or masses. Good bowel sounds. Extremities: no cyanosis, clubbing, rash, trace bilateral LE edema unna boots  Neuro: alert & oriented x 3, cranial nerves grossly intact. moves all 4 extremities w/o difficulty. Affect pleasant.    Telemetry   NSR 60s (personally reviewed)  Labs    CBC Recent Labs    07/19/21 0430  WBC 11.2*  HGB 17.8*  HCT 53.2*  MCV 87.1  PLT 148*   Basic Metabolic Panel Recent Labs    67/67/20 0450 07/19/21 0430  NA 134* 132*  K 4.3 4.0  CL 93* 87*  CO2 33* 34*  GLUCOSE  266* 247*  BUN 30* 44*  CREATININE 1.16* 1.46*  CALCIUM 9.7 10.2   Liver Function Tests No results for input(s): AST, ALT, ALKPHOS, BILITOT, PROT, ALBUMIN in the last 72 hours.  No results for input(s): LIPASE, AMYLASE in the last 72 hours. Cardiac Enzymes No results for input(s): CKTOTAL, CKMB, CKMBINDEX, TROPONINI in the last 72 hours.  BNP: BNP (last 3 results) Recent Labs    05/27/21 1044 07/12/21 1018 07/15/21 1429  BNP 928.3* 667.6* 671.5*    ProBNP (last 3 results) Recent Labs    04/22/21 1147  PROBNP 3,347*     D-Dimer No results for input(s): DDIMER in the last 72 hours. Hemoglobin A1C No results for input(s): HGBA1C in the last 72 hours. Fasting Lipid Panel No results for input(s): CHOL, HDL, LDLCALC, TRIG, CHOLHDL, LDLDIRECT in the last 72 hours. Thyroid Function Tests No results for input(s): TSH, T4TOTAL, T3FREE, THYROIDAB in the last 72 hours.  Invalid input(s): FREET3   Other results:   Imaging    No results found.   Medications:     Scheduled Medications:  buprenorphine-naloxone  1 tablet Sublingual BID   cariprazine  1.5 mg Oral Daily   Chlorhexidine Gluconate Cloth  6 each Topical Daily   enoxaparin (LOVENOX) injection  40 mg Subcutaneous Q24H   famotidine  40 mg Oral Daily   fluticasone furoate-vilanterol  1 puff Inhalation Daily   insulin aspart  0-15 Units Subcutaneous TID WC   insulin aspart  0-5 Units Subcutaneous QHS   insulin glargine-yfgn  10 Units Subcutaneous Daily   nicotine  21 mg Transdermal Daily   potassium chloride  30 mEq Oral BID   pregabalin  150 mg Oral TID   sodium chloride flush  10-40 mL Intracatheter Q12H   sodium chloride flush  3 mL Intravenous Q12H   [START ON 07/20/2021] sodium chloride flush  3 mL Intravenous Q12H   spironolactone  12.5 mg Oral Daily   umeclidinium bromide  1 puff Inhalation Daily    Infusions:  sodium chloride     furosemide (LASIX) 200 mg in dextrose 5% 100 mL (2mg /mL)  infusion 15 mg/hr (07/18/21 1952)    PRN Medications: sodium chloride, acetaminophen, albuterol, ondansetron (ZOFRAN) IV, sodium chloride flush, sodium chloride flush    Assessment/Plan   1. Acute on Chronic Diastolic HF due to severe mitral valve disease -TEE 04/29/2021 EF 60-65%, normal RV size and function, Severe MR,  Mod MS likely burnt out vegetation on both leaflets, TR mild-mod - NYHA IIIb-IV with R>L HF - Marked volume overload despite increased home diuretic regimen.  - On Lasix 15 mg/hr. Diuresing well. Wt down 20 lb total. CVP 7 - Reduce lasix gtt to 8/hr. Transition to PO torsemide tomorrow  - Repeat RHC tomorrow  - Continue spironolactone 12.5 daily - Co-ox 56%. Follow closely - UNNA boots    2. Pulmonary HTN - multifactorial, based on current workup primarily group 2 and 3  - She will need optimization of her inhaler regimen as well as a sleep study - She is seeing Dr. 06/29/2021  - Stressed need to stop smoking   3. Mitral valve disease, severe -TEE 04/29/2021 EF 60-65%, normal RV size and function, Mod MS/Severe MR, likely burnt out vegetation on both leaflets, TR mild-mod - Ideally she will need to valve replaced but currently not a surgical candidate - Has seen Dr. 06/29/2021 and agrees with need for surgical MVR but has to stop smoking completely. - With markedly elevated PCWP on recent cath will not start selective pulmonary artery vasodilators at this time - As above with good diuresis w/ IV Lasix. Transition to PO diuretics tomorrow  - Will d/w Dr. Laneta Simmers regarding timing of surgery. May be best to do while in house as she won't be smoking and may be hard to keep her tuned up as outpatient.    4. COPD with ongoing tobacco use - Following with Dr. Laneta Simmers - Discussed smoking cessation    5. Opioid Use Disorder -sees Dr. Blenda Nicely in [place] who prescribes suboxone -has not used IV opiates or cocaine in 2 years per her report    6. HCV -needs ID follow up  eventually  7. Diabetes - last Hgb A1c 2017 was 9.0 - CBGs elevated 200-300s - Add long acting insulin - Repeat A1c pending    Length of Stay: 4  Brittainy Simmons, PA-C  07/19/2021, 8:49 AM  Advanced Heart Failure Team Pager [phone] (M-F; 7a - 5p)  Please contact [facility] for night-coverage after hours (5p -7a ) and weekends on [email or web address]   Patient seen and examined with the above-signed Advanced Practice Provider and/or Housestaff. I personally reviewed laboratory data, imaging studies and relevant notes. I independently examined the patient and formulated the important aspects of the plan. I have edited the note to reflect  any of my changes or salient points. I have personally discussed the plan with the patient and/or family.  She continues to diurese well. Have hand and leg cramps. Breathing improved. No orthopnea or PND.  CBGs up. Creatinine stable. CVP 7 co-ox 56%  General:  Sitting up in chair. No resp difficulty HEENT: normal Neck: supple.JVP 7 Carotids 2+ bilat; no bruits. No lymphadenopathy or thryomegaly appreciated. Cor: PMI nondisplaced. Regular rate & rhythm. 2/6 TR/MR Lungs: clear Abdomen: obese soft, nontender, nondistended. No hepatosplenomegaly. No bruits or masses. Good bowel sounds. Extremities: no cyanosis, clubbing, rash, 1+ edema + UNNA Neuro: alert & orientedx3, cranial nerves grossly intact. moves all 4 extremities w/o difficulty. Affect pleasant  Volume status continues to improve but still with some volume on board. Will continue diuresis but cut lasix rate down some with cramps. Plan RHC tomorrow. If pulmonary pressures still high will place IJ swan and move to ICU for milrinone +/- NO to try to get pressures down pre-operatively.   Arvilla Meres, MD  10:31 PM

## 2021-07-19 NOTE — Progress Notes (Signed)
Mobility Specialist Progress Note    07/19/21 1020  Mobility  Activity Ambulated in hall  Level of Assistance Modified independent, requires aide device or extra time  Assistive Device  (IV pole)  Distance Ambulated (ft) 815 ft  Mobility Ambulated independently in hallway  Mobility Response Tolerated well  Mobility performed by Mobility specialist  Bed Position Chair  $Mobility charge 1 Mobility   Pt received in room and agreeable. C/o some wobbliness. Returned to chair with call bell in reach.   [facility] Mobility Specialist  M.S. Primary Phone: [phone] M.S. Secondary Phone: [phone]

## 2021-07-19 NOTE — Progress Notes (Signed)
Pre op MVR has been completed.   Preliminary results in CV Proc.   Emily Larson 07/19/2021 9:53 AM

## 2021-07-19 NOTE — Progress Notes (Addendum)
Patient ID: Emily Larson, female   DOB: 04/09/1976, 45 y.o.   MRN: [id number]     Advanced Heart Failure Rounding Note  PCP-Cardiologist: None   Subjective:    Net negative 3.3L w/ IV Lasix yesterday. Wt down another 7 lb. 20 lb total. CVP 7   SCr 1.31>>1.04>>1.16>>1.46  K 4.0  Co-ox 62%   Denies dyspnea. Complaining of cramping in hands, feet and rib cage.  K 4.0  Mg pending    Objective:   Weight Range: 80.1 kg Body mass index is 27.66 kg/m.   Vital Signs:   Temp:  [97.6 F (36.4 C)-98 F (36.7 C)] 98 F (36.7 C) (11/29 0742) Pulse Rate:  [72-81] 81 (11/29 0408) Resp:  [13-18] 13 (11/29 0742) BP: (107-116)/(61-83) 113/83 (11/29 0742) SpO2:  [91 %-98 %] 97 % (11/29 0803) Weight:  [80.1 kg] 80.1 kg (11/29 0408) Last BM Date: 07/17/21  Weight change: Filed Weights   07/17/21 0346 07/18/21 0446 07/19/21 0408  Weight: 85.8 kg 83.2 kg 80.1 kg    Intake/Output:   Intake/Output Summary (Last 24 hours) at 07/19/2021 0849 Last data filed at 07/19/2021 0848 Gross per 24 hour  Intake 900 ml  Output 4800 ml  Net -3900 ml      Physical Exam   CVP 7  General:  Well appearing. No respiratory difficulty HEENT: normal Neck: supple. no JVD. Carotids 2+ bilat; no bruits. No lymphadenopathy or thyromegaly appreciated. Cor: PMI nondisplaced. Regular rate & rhythm. 3/6 MR murmur  Lungs: clear Abdomen: soft, nontender, nondistended. No hepatosplenomegaly. No bruits or masses. Good bowel sounds. Extremities: no cyanosis, clubbing, rash, trace bilateral LE edema unna boots  Neuro: alert & oriented x 3, cranial nerves grossly intact. moves all 4 extremities w/o difficulty. Affect pleasant.    Telemetry   NSR 60s (personally reviewed)  Labs    CBC Recent Labs    07/19/21 0430  WBC 11.2*  HGB 17.8*  HCT 53.2*  MCV 87.1  PLT 148*   Basic Metabolic Panel Recent Labs    67/67/20 0450 07/19/21 0430  NA 134* 132*  K 4.3 4.0  CL 93* 87*  CO2 33* 34*  GLUCOSE  266* 247*  BUN 30* 44*  CREATININE 1.16* 1.46*  CALCIUM 9.7 10.2   Liver Function Tests No results for input(s): AST, ALT, ALKPHOS, BILITOT, PROT, ALBUMIN in the last 72 hours.  No results for input(s): LIPASE, AMYLASE in the last 72 hours. Cardiac Enzymes No results for input(s): CKTOTAL, CKMB, CKMBINDEX, TROPONINI in the last 72 hours.  BNP: BNP (last 3 results) Recent Labs    05/27/21 1044 07/12/21 1018 07/15/21 1429  BNP 928.3* 667.6* 671.5*    ProBNP (last 3 results) Recent Labs    04/22/21 1147  PROBNP 3,347*     D-Dimer No results for input(s): DDIMER in the last 72 hours. Hemoglobin A1C No results for input(s): HGBA1C in the last 72 hours. Fasting Lipid Panel No results for input(s): CHOL, HDL, LDLCALC, TRIG, CHOLHDL, LDLDIRECT in the last 72 hours. Thyroid Function Tests No results for input(s): TSH, T4TOTAL, T3FREE, THYROIDAB in the last 72 hours.  Invalid input(s): FREET3   Other results:   Imaging    No results found.   Medications:     Scheduled Medications:  buprenorphine-naloxone  1 tablet Sublingual BID   cariprazine  1.5 mg Oral Daily   Chlorhexidine Gluconate Cloth  6 each Topical Daily   enoxaparin (LOVENOX) injection  40 mg Subcutaneous Q24H   famotidine  40 mg Oral Daily   fluticasone furoate-vilanterol  1 puff Inhalation Daily   insulin aspart  0-15 Units Subcutaneous TID WC   insulin aspart  0-5 Units Subcutaneous QHS   insulin glargine-yfgn  10 Units Subcutaneous Daily   nicotine  21 mg Transdermal Daily   potassium chloride  30 mEq Oral BID   pregabalin  150 mg Oral TID   sodium chloride flush  10-40 mL Intracatheter Q12H   sodium chloride flush  3 mL Intravenous Q12H   [START ON 07/20/2021] sodium chloride flush  3 mL Intravenous Q12H   spironolactone  12.5 mg Oral Daily   umeclidinium bromide  1 puff Inhalation Daily    Infusions:  sodium chloride     furosemide (LASIX) 200 mg in dextrose 5% 100 mL (2mg/mL)  infusion 15 mg/hr (07/18/21 1952)    PRN Medications: sodium chloride, acetaminophen, albuterol, ondansetron (ZOFRAN) IV, sodium chloride flush, sodium chloride flush    Assessment/Plan   1. Acute on Chronic Diastolic HF due to severe mitral valve disease -TEE 04/29/2021 EF 60-65%, normal RV size and function, Severe MR,  Mod MS likely burnt out vegetation on both leaflets, TR mild-mod - NYHA IIIb-IV with R>L HF - Marked volume overload despite increased home diuretic regimen.  - On Lasix 15 mg/hr. Diuresing well. Wt down 20 lb total. CVP 7 - Reduce lasix gtt to 8/hr. Transition to PO torsemide tomorrow  - Repeat RHC tomorrow  - Continue spironolactone 12.5 daily - Co-ox 56%. Follow closely - UNNA boots    2. Pulmonary HTN - multifactorial, based on current workup primarily group 2 and 3  - She will need optimization of her inhaler regimen as well as a sleep study - She is seeing Dr. Chodri  - Stressed need to stop smoking   3. Mitral valve disease, severe -TEE 04/29/2021 EF 60-65%, normal RV size and function, Mod MS/Severe MR, likely burnt out vegetation on both leaflets, TR mild-mod - Ideally she will need to valve replaced but currently not a surgical candidate - Has seen Dr. Bartle and agrees with need for surgical MVR but has to stop smoking completely. - With markedly elevated PCWP on recent cath will not start selective pulmonary artery vasodilators at this time - As above with good diuresis w/ IV Lasix. Transition to PO diuretics tomorrow  - Will d/w Dr. Bartle regarding timing of surgery. May be best to do while in house as she won't be smoking and may be hard to keep her tuned up as outpatient.    4. COPD with ongoing tobacco use - Following with Dr. Chodri - Discussed smoking cessation    5. Opioid Use Disorder -sees Dr. Lee in [place] who prescribes suboxone -has not used IV opiates or cocaine in 2 years per her report    6. HCV -needs ID follow up  eventually  7. Diabetes - last Hgb A1c 2017 was 9.0 - CBGs elevated 200-300s - Add long acting insulin - Repeat A1c pending    Length of Stay: 4  Emily Simmons, PA-C  07/19/2021, 8:49 AM  Advanced Heart Failure Team Pager [phone] (M-F; 7a - 5p)  Please contact [facility] for night-coverage after hours (5p -7a ) and weekends on [email or web address]   Patient seen and examined with the above-signed Advanced Practice Provider and/or Housestaff. I personally reviewed laboratory data, imaging studies and relevant notes. I independently examined the patient and formulated the important aspects of the plan. I have edited the note to reflect   any of my changes or salient points. I have personally discussed the plan with the patient and/or family.  She continues to diurese well. Have hand and leg cramps. Breathing improved. No orthopnea or PND.  CBGs up. Creatinine stable. CVP 7 co-ox 56%  General:  Sitting up in chair. No resp difficulty HEENT: normal Neck: supple.JVP 7 Carotids 2+ bilat; no bruits. No lymphadenopathy or thryomegaly appreciated. Cor: PMI nondisplaced. Regular rate & rhythm. 2/6 TR/MR Lungs: clear Abdomen: obese soft, nontender, nondistended. No hepatosplenomegaly. No bruits or masses. Good bowel sounds. Extremities: no cyanosis, clubbing, rash, 1+ edema + UNNA Neuro: alert & orientedx3, cranial nerves grossly intact. moves all 4 extremities w/o difficulty. Affect pleasant  Volume status continues to improve but still with some volume on board. Will continue diuresis but cut lasix rate down some with cramps. Plan RHC tomorrow. If pulmonary pressures still high will place IJ swan and move to ICU for milrinone +/- NO to try to get pressures down pre-operatively.   Emily Tenbrink, MD  10:31 PM    

## 2021-07-19 NOTE — Progress Notes (Signed)
Reviewed insulin pen administration with patient. She has been on Lantus in the past and used a pen. She was taken off all the diabetes medications about 1 year ago. Was diagnosed with diabetes about 17 years ago.   Patient was able to repeat the procedure. Has insulin pen starter kit and Living Well with Diabetes booklet.   Harvel Ricks RN BSN CDE Diabetes Coordinator Pager: [phone]  8am-5pm

## 2021-07-19 NOTE — Progress Notes (Signed)
Heart Failure Navigator Progress Note  Assessed for Heart & Vascular TOC clinic readiness.  Patient does not meet criteria due to attending is AHF rounding team.   Navigator available for reassessment of patient.   Ozella Rocks, MSN, RN Heart Failure Nurse Navigator [phone]

## 2021-07-20 ENCOUNTER — Inpatient Hospital Stay (HOSPITAL_COMMUNITY)
Admission: RE | Disposition: A | Discharge status: Other Acute Inpatient Hospital | Payer: Self-pay | Source: Ambulatory Visit | Attending: Internal Medicine

## 2021-07-20 ENCOUNTER — Inpatient Hospital Stay (HOSPITAL_COMMUNITY): Payer: Medicare Other

## 2021-07-20 DIAGNOSIS — I340 Nonrheumatic mitral (valve) insufficiency: Secondary | ICD-10-CM

## 2021-07-20 DIAGNOSIS — I2721 Secondary pulmonary arterial hypertension: Secondary | ICD-10-CM

## 2021-07-20 DIAGNOSIS — I5033 Acute on chronic diastolic (congestive) heart failure: Secondary | ICD-10-CM | POA: Diagnosis not present

## 2021-07-20 HISTORY — PX: RIGHT HEART CATH: CATH118263

## 2021-07-20 LAB — POCT I-STAT EG7
Acid-Base Excess: 2 mmol/L (ref 0.0–2.0)
Acid-Base Excess: 3 mmol/L — ABNORMAL HIGH (ref 0.0–2.0)
Bicarbonate: 29.3 mmol/L — ABNORMAL HIGH (ref 20.0–28.0)
Bicarbonate: 29.6 mmol/L — ABNORMAL HIGH (ref 20.0–28.0)
Calcium, Ion: 1.2 mmol/L (ref 1.15–1.40)
Calcium, Ion: 1.23 mmol/L (ref 1.15–1.40)
HCT: 58 % — ABNORMAL HIGH (ref 36.0–46.0)
HCT: 58 % — ABNORMAL HIGH (ref 36.0–46.0)
Hemoglobin: 19.7 g/dL — ABNORMAL HIGH (ref 12.0–15.0)
Hemoglobin: 19.7 g/dL — ABNORMAL HIGH (ref 12.0–15.0)
O2 Saturation: 60 %
O2 Saturation: 63 %
Potassium: 3.8 mmol/L (ref 3.5–5.1)
Potassium: 3.8 mmol/L (ref 3.5–5.1)
Sodium: 136 mmol/L (ref 135–145)
Sodium: 136 mmol/L (ref 135–145)
TCO2: 31 mmol/L (ref 22–32)
TCO2: 31 mmol/L (ref 22–32)
pCO2, Ven: 50.5 mmHg (ref 44.0–60.0)
pCO2, Ven: 50.9 mmHg (ref 44.0–60.0)
pH, Ven: 7.369 (ref 7.250–7.430)
pH, Ven: 7.376 (ref 7.250–7.430)
pO2, Ven: 33 mmHg (ref 32.0–45.0)
pO2, Ven: 34 mmHg (ref 32.0–45.0)

## 2021-07-20 LAB — COOXEMETRY PANEL
Carboxyhemoglobin: 1.5 % (ref 0.5–1.5)
Carboxyhemoglobin: 1.8 % — ABNORMAL HIGH (ref 0.5–1.5)
Methemoglobin: 0.6 % (ref 0.0–1.5)
Methemoglobin: 0.9 % (ref 0.0–1.5)
O2 Saturation: 64.6 %
O2 Saturation: 74.1 %
Total hemoglobin: 17.8 g/dL — ABNORMAL HIGH (ref 12.0–16.0)
Total hemoglobin: 15.1 g/dL (ref 12.0–16.0)

## 2021-07-20 LAB — BASIC METABOLIC PANEL
Anion gap: 10 (ref 5–15)
BUN: 53 mg/dL — ABNORMAL HIGH (ref 6–20)
CO2: 29 mmol/L (ref 22–32)
Calcium: 9.3 mg/dL (ref 8.9–10.3)
Chloride: 90 mmol/L — ABNORMAL LOW (ref 98–111)
Creatinine, Ser: 1.43 mg/dL — ABNORMAL HIGH (ref 0.44–1.00)
GFR, Estimated: 47 mL/min — ABNORMAL LOW (ref >60)
Glucose, Bld: 256 mg/dL — ABNORMAL HIGH (ref 70–99)
Potassium: 3.9 mmol/L (ref 3.5–5.1)
Sodium: 129 mmol/L — ABNORMAL LOW (ref 135–145)

## 2021-07-20 LAB — GLUCOSE, CAPILLARY
Glucose-Capillary: 240 mg/dL — ABNORMAL HIGH (ref 70–99)
Glucose-Capillary: 210 mg/dL — ABNORMAL HIGH (ref 70–99)
Glucose-Capillary: 147 mg/dL — ABNORMAL HIGH (ref 70–99)
Glucose-Capillary: 298 mg/dL — ABNORMAL HIGH (ref 70–99)

## 2021-07-20 LAB — HEMOGLOBIN A1C
Hgb A1c MFr Bld: 10.2 % — ABNORMAL HIGH (ref 4.8–5.6)
Mean Plasma Glucose: 246 mg/dL

## 2021-07-20 LAB — PREGNANCY, URINE: Preg Test, Ur: NEGATIVE

## 2021-07-20 LAB — LACTIC ACID, PLASMA: Lactic Acid, Venous: 1 mmol/L (ref 0.5–1.9)

## 2021-07-20 SURGERY — RIGHT HEART CATH
Anesthesia: LOCAL

## 2021-07-20 MED ORDER — SODIUM CHLORIDE 0.9 % IV SOLN: INTRAVENOUS | Status: DC

## 2021-07-20 MED ORDER — SODIUM CHLORIDE 0.9% IV SOLUTION: INTRAVENOUS | Status: DC | PRN

## 2021-07-20 MED ORDER — MILRINONE LACTATE IN DEXTROSE 20-5 MG/100ML-% IV SOLN
0.2500 ug/kg/min | INTRAVENOUS | Status: DC
  Administered 2021-07-20 – 2021-07-23 (×5): 0.25 ug/kg/min via INTRAVENOUS
  Filled 2021-07-20 (×5): qty 100

## 2021-07-20 MED ORDER — LIDOCAINE HCL (PF) 1 % IJ SOLN
INTRAMUSCULAR | Status: AC
  Filled 2021-07-20: qty 30

## 2021-07-20 MED ORDER — ASPIRIN 81 MG PO CHEW
81.0000 mg | CHEWABLE_TABLET | ORAL | Status: AC
  Administered 2021-07-20: 81 mg via ORAL
  Filled 2021-07-20: qty 1

## 2021-07-20 MED ORDER — ONDANSETRON HCL 4 MG/2ML IJ SOLN: 4.0000 mg | Freq: Four times a day (QID) | INTRAMUSCULAR | Status: DC | PRN

## 2021-07-20 MED ORDER — SODIUM CHLORIDE 0.9% FLUSH: 3.0000 mL | INTRAVENOUS | Status: DC | PRN

## 2021-07-20 MED ORDER — SODIUM CHLORIDE 0.9% IV SOLUTION: INTRAVENOUS | Status: DC

## 2021-07-20 MED ORDER — HEPARIN (PORCINE) IN NACL 1000-0.9 UT/500ML-% IV SOLN
INTRAVENOUS | Status: AC
  Filled 2021-07-20: qty 500

## 2021-07-20 MED ORDER — FENTANYL CITRATE (PF) 100 MCG/2ML IJ SOLN
INTRAMUSCULAR | Status: AC
  Filled 2021-07-20: qty 2

## 2021-07-20 MED ORDER — POTASSIUM CHLORIDE CRYS ER 20 MEQ PO TBCR
40.0000 meq | EXTENDED_RELEASE_TABLET | Freq: Two times a day (BID) | ORAL | Status: DC
  Administered 2021-07-20 – 2021-07-21 (×3): 40 meq via ORAL
  Filled 2021-07-20 (×3): qty 2

## 2021-07-20 MED ORDER — ACETAMINOPHEN 325 MG PO TABS
650.0000 mg | ORAL_TABLET | ORAL | Status: DC | PRN
  Filled 2021-07-20 (×4): qty 2

## 2021-07-20 MED ORDER — LABETALOL HCL 5 MG/ML IV SOLN: 10.0000 mg | INTRAVENOUS | Status: AC | PRN

## 2021-07-20 MED ORDER — INSULIN GLARGINE-YFGN 100 UNIT/ML ~~LOC~~ SOLN
20.0000 [IU] | Freq: Every day | SUBCUTANEOUS | Status: DC
  Administered 2021-07-21: 20 [IU] via SUBCUTANEOUS
  Filled 2021-07-20 (×2): qty 0.2

## 2021-07-20 MED ORDER — SILDENAFIL CITRATE 20 MG PO TABS
20.0000 mg | ORAL_TABLET | Freq: Three times a day (TID) | ORAL | Status: DC
  Administered 2021-07-20 – 2021-07-21 (×3): 20 mg via ORAL
  Filled 2021-07-20 (×3): qty 1

## 2021-07-20 MED ORDER — HYDRALAZINE HCL 20 MG/ML IJ SOLN: 10.0000 mg | INTRAMUSCULAR | Status: AC | PRN

## 2021-07-20 MED ORDER — SODIUM CHLORIDE 0.9 % IV SOLN: 250.0000 mL | INTRAVENOUS | Status: DC | PRN

## 2021-07-20 MED ORDER — ENOXAPARIN SODIUM 40 MG/0.4ML IJ SOSY
40.0000 mg | PREFILLED_SYRINGE | INTRAMUSCULAR | Status: DC
  Administered 2021-07-21 – 2021-07-23 (×3): 40 mg via SUBCUTANEOUS
  Filled 2021-07-20 (×3): qty 0.4

## 2021-07-20 MED ORDER — SODIUM CHLORIDE 0.9% FLUSH
3.0000 mL | Freq: Two times a day (BID) | INTRAVENOUS | Status: DC
  Administered 2021-07-20 – 2021-07-23 (×6): 3 mL via INTRAVENOUS

## 2021-07-20 MED ORDER — HEPARIN (PORCINE) IN NACL 1000-0.9 UT/500ML-% IV SOLN
INTRAVENOUS | Status: DC | PRN
  Administered 2021-07-20: 500 mL

## 2021-07-20 MED ORDER — MIDAZOLAM HCL 2 MG/2ML IJ SOLN
INTRAMUSCULAR | Status: DC | PRN
  Administered 2021-07-20: 1 mg via INTRAVENOUS

## 2021-07-20 MED ORDER — FENTANYL CITRATE (PF) 100 MCG/2ML IJ SOLN
INTRAMUSCULAR | Status: DC | PRN
  Administered 2021-07-20: 25 ug via INTRAVENOUS

## 2021-07-20 MED ORDER — SODIUM CHLORIDE 0.9 % IV SOLN
INTRAVENOUS | Status: DC | PRN
  Administered 2021-07-20 (×2): 10 mL/h via INTRAVENOUS

## 2021-07-20 MED ORDER — MIDAZOLAM HCL 2 MG/2ML IJ SOLN
INTRAMUSCULAR | Status: AC
  Filled 2021-07-20: qty 2

## 2021-07-20 SURGICAL SUPPLY — 12 items
CATH BALLN WEDGE 5F 110CM (CATHETERS) ×2 IMPLANT
CATH SWAN GANZ VIP 7.5F (CATHETERS) ×2 IMPLANT
GUIDEWIRE .025 260CM (WIRE) ×2 IMPLANT
KIT ENCORE 26 ADVANTAGE (KITS) ×2 IMPLANT
KIT HEART LEFT (KITS) ×2 IMPLANT
PACK CARDIAC CATHETERIZATION (CUSTOM PROCEDURE TRAY) ×2 IMPLANT
SHEATH GLIDE SLENDER 4/5FR (SHEATH) ×2 IMPLANT
SHEATH PINNACLE 8F 10CM (SHEATH) ×2 IMPLANT
SHEATH PROBE COVER 6X72 (BAG) ×2 IMPLANT
SLEEVE REPOSITIONING LENGTH 30 (MISCELLANEOUS) ×2 IMPLANT
TRANSDUCER W/STOPCOCK (MISCELLANEOUS) ×2 IMPLANT
WIRE EMERALD 3MM-J .035X150CM (WIRE) ×2 IMPLANT

## 2021-07-20 NOTE — Progress Notes (Signed)
Pt arrived to cath lab holding, [place] [address], [place] in place to left neck, CDI, IV NS infusing x2, pressure bag noted, Lasix gtt infusing at 8mg /hour = 4cc/hour, safety maintained, monitor in place

## 2021-07-20 NOTE — Progress Notes (Addendum)
Patient ID: Emily Larson, female   DOB: 1976-07-25, 45 y.o.   MRN: [id number]     Advanced Heart Failure Rounding Note  PCP-Cardiologist: None   Subjective:    Co-ox 65%. Continues on lasix gtt at 8 mg/hr. Less cramps noted after reducing lasix rate. CVP 5.  Weight down 1 lb overnight, negative 1.8L, did have unmeasured void  Scr 1.31 > 1.04 > 1.16 > 1.46 > 1.43  K 3.9  Na 134 > 133 > 132 > 129  Going for RHC this afternoon  Dyspnea improving. Still feels a little bloating in abdomen.   Objective:   Weight Range: 79.5 kg Body mass index is 27.45 kg/m.   Vital Signs:   Temp:  [97.6 F (36.4 C)-98.7 F (37.1 C)] 97.6 F (36.4 C) (11/30 0423) Pulse Rate:  [80-82] 80 (11/30 0423) Resp:  [11-19] 18 (11/30 0423) BP: (102-114)/(66-86) 114/85 (11/30 0423) SpO2:  [96 %-98 %] 96 % (11/29 2357) Weight:  [79.5 kg] 79.5 kg (11/30 0423) Last BM Date: 07/17/21  Weight change: Filed Weights   07/18/21 0446 07/19/21 0408 07/20/21 0423  Weight: 83.2 kg 80.1 kg 79.5 kg    Intake/Output:   Intake/Output Summary (Last 24 hours) at 07/20/2021 0705 Last data filed at 07/20/2021 0425 Gross per 24 hour  Intake 2159.36 ml  Output 4000 ml  Net -1840.64 ml      Physical Exam   CVP 5 sitting in chair General:  No distress HEENT: normal Neck: supple. JVP 7 cm. Carotids 2+ bilat; no bruits.  Cor: PMI nondisplaced. Regular rate & rhythm. No rubs, gallops, 3/6 holosystolic murmur at pex Lungs: clear Abdomen: soft, nontender, nondistended. Extremities: no cyanosis, clubbing, rash, trace BLE edema, UNNA boots Neuro: alert & orientedx3, cranial nerves grossly intact. moves all 4 extremities w/o difficulty. Affect pleasant     Telemetry   NSR 60s-70s  (personally reviewed)  Labs    CBC Recent Labs    07/19/21 0430  WBC 11.2*  HGB 17.8*  HCT 53.2*  MCV 87.1  PLT 148*   Basic Metabolic Panel Recent Labs    86/38/17 0430 07/20/21 0500  NA 132* 129*  K 4.0 3.9  CL  87* 90*  CO2 34* 29  GLUCOSE 247* 256*  BUN 44* 53*  CREATININE 1.46* 1.43*  CALCIUM 10.2 9.3  MG 2.3  --    Liver Function Tests No results for input(s): AST, ALT, ALKPHOS, BILITOT, PROT, ALBUMIN in the last 72 hours.  No results for input(s): LIPASE, AMYLASE in the last 72 hours. Cardiac Enzymes No results for input(s): CKTOTAL, CKMB, CKMBINDEX, TROPONINI in the last 72 hours.  BNP: BNP (last 3 results) Recent Labs    05/27/21 1044 07/12/21 1018 07/15/21 1429  BNP 928.3* 667.6* 671.5*    ProBNP (last 3 results) Recent Labs    04/22/21 1147  PROBNP 3,347*     D-Dimer No results for input(s): DDIMER in the last 72 hours. Hemoglobin A1C No results for input(s): HGBA1C in the last 72 hours. Fasting Lipid Panel No results for input(s): CHOL, HDL, LDLCALC, TRIG, CHOLHDL, LDLDIRECT in the last 72 hours. Thyroid Function Tests No results for input(s): TSH, T4TOTAL, T3FREE, THYROIDAB in the last 72 hours.  Invalid input(s): FREET3   Other results:   Imaging    VAS US DOPPLER PRE CABG  Result Date: 07/19/2021 PREOPERATIVE VASCULAR EVALUATION Patient Name:  [facility] Dziedzic  Date of Exam:   07/19/2021 Medical Rec #: [id number]   Accession #:  [id number] Date of Birth: 06-17-1976   Patient Gender: F Patient Age:   35 years Exam Location:  [facility] Procedure:      VAS US DOPPLER PRE CABG Referring Phys: Evelene Croon --------------------------------------------------------------------------------  Indications:      Pre op MVR. Risk Factors:     Hypertension, hyperlipidemia, Diabetes. Comparison Study: no prior Performing Technologist: [place] Ponder RVS  Examination Guidelines: A complete evaluation includes B-mode imaging, spectral Doppler, color Doppler, and power Doppler as needed of all accessible portions of each vessel. Bilateral testing is considered an integral part of a complete examination. Limited examinations for reoccurring indications may be performed as  noted.  Right Carotid Findings: +----------+--------+--------+--------+------------+--------+           PSV cm/sEDV cm/sStenosisDescribe    Comments +----------+--------+--------+--------+------------+--------+ CCA Prox  73      13              heterogenous         +----------+--------+--------+--------+------------+--------+ CCA Distal54      11              heterogenous         +----------+--------+--------+--------+------------+--------+ ICA Prox  71      24      1-39%   heterogenous         +----------+--------+--------+--------+------------+--------+ ICA Distal94      31                                   +----------+--------+--------+--------+------------+--------+ ECA       109     11                                   +----------+--------+--------+--------+------------+--------+ +----------+--------+-------+--------+------------+           PSV cm/sEDV cmsDescribeArm Pressure +----------+--------+-------+--------+------------+ Subclavian97                                  +----------+--------+-------+--------+------------+ +---------+--------+--+--------+-+---------+ VertebralPSV cm/s22EDV cm/s8Antegrade +---------+--------+--+--------+-+---------+ Left Carotid Findings: +----------+--------+--------+--------+------------+--------+           PSV cm/sEDV cm/sStenosisDescribe    Comments +----------+--------+--------+--------+------------+--------+ CCA Prox  92      9               heterogenous         +----------+--------+--------+--------+------------+--------+ CCA Distal83      12              heterogenous         +----------+--------+--------+--------+------------+--------+ ICA Prox  69      14      1-39%   heterogenous         +----------+--------+--------+--------+------------+--------+ ICA Distal81      27                                   +----------+--------+--------+--------+------------+--------+ ECA        75      9                                    +----------+--------+--------+--------+------------+--------+ +----------+--------+--------+--------+------------+ SubclavianPSV cm/sEDV cm/sDescribeArm Pressure +----------+--------+--------+--------+------------+  144                                  +----------+--------+--------+--------+------------+ +---------+--------+--+--------+--+---------+ VertebralPSV cm/s40EDV cm/s10Antegrade +---------+--------+--+--------+--+---------+  ABI Findings: +--------+------------------+-----+---------+--------+ Right   Rt Pressure (mmHg)IndexWaveform Comment  +--------+------------------+-----+---------+--------+ Brachial                       triphasic         +--------+------------------+-----+---------+--------+ +--------+------------------+-----+--------+-------+ Left    Lt Pressure (mmHg)IndexWaveformComment +--------+------------------+-----+--------+-------+ Brachial                       biphasic        +--------+------------------+-----+--------+-------+  Right Doppler Findings: +--------+--------+-----+---------+--------+ Site    PressureIndexDoppler  Comments +--------+--------+-----+---------+--------+ Brachial             triphasic         +--------+--------+-----+---------+--------+ Radial               biphasic          +--------+--------+-----+---------+--------+ Ulnar                triphasic         +--------+--------+-----+---------+--------+  Left Doppler Findings: +--------+--------+-----+----------+--------+ Site    PressureIndexDoppler   Comments +--------+--------+-----+----------+--------+ Brachial             biphasic           +--------+--------+-----+----------+--------+ Radial               monophasic         +--------+--------+-----+----------+--------+ Ulnar                monophasic         +--------+--------+-----+----------+--------+  Summary:  Right Carotid: Velocities in the right ICA are consistent with a 1-39% stenosis. Left Carotid: Velocities in the left ICA are consistent with a 1-39% stenosis. Vertebrals: Bilateral vertebral arteries demonstrate antegrade flow. Right Upper Extremity: Doppler waveforms remain within normal limits with right radial compression. Doppler waveform obliterate with right ulnar compression. Left Upper Extremity: Doppler waveforms remain within normal limits with left radial compression. Doppler waveforms remain within normal limits with left ulnar compression.  Electronically signed by Coral Else MD on 07/19/2021 at 7:03:42 PM.    Final      Medications:     Scheduled Medications:  buprenorphine-naloxone  1 tablet Sublingual BID   cariprazine  1.5 mg Oral Daily   Chlorhexidine Gluconate Cloth  6 each Topical Daily   enoxaparin (LOVENOX) injection  40 mg Subcutaneous Q24H   famotidine  40 mg Oral Daily   fluticasone furoate-vilanterol  1 puff Inhalation Daily   insulin aspart  0-5 Units Subcutaneous QHS   insulin aspart  0-9 Units Subcutaneous TID WC   insulin aspart  4 Units Subcutaneous TID WC   insulin glargine-yfgn  16 Units Subcutaneous Daily   nicotine  21 mg Transdermal Daily   potassium chloride  30 mEq Oral BID   pregabalin  150 mg Oral TID   sodium chloride flush  10-40 mL Intracatheter Q12H   sodium chloride flush  3 mL Intravenous Q12H   sodium chloride flush  3 mL Intravenous Q12H   spironolactone  12.5 mg Oral Daily   traMADol  50 mg Oral Q8H   umeclidinium bromide  1 puff Inhalation Daily    Infusions:  sodium chloride     sodium chloride     sodium  chloride     furosemide (LASIX) 200 mg in dextrose 5% 100 mL ( /mL) infusion 8 mg/hr (07/19/21 1114)    PRN Medications: sodium chloride, sodium chloride, acetaminophen, albuterol, ondansetron (ZOFRAN) IV, sodium chloride flush, sodium chloride flush, sodium chloride flush    Assessment/Plan   1. Acute on Chronic  Diastolic HF due to severe mitral valve disease -TEE 04/29/2021 EF 60-65%, normal RV size and function, Severe MR,  Mod MS likely burnt out vegetation on both leaflets, TR mild-mod - NYHA IIIb-IV with R>L HF - Marked volume overload despite increased home diuretic regimen. - Wt down 20 lb on lasix gtt at 15/hr. Reduced to 8/hr yesterday d/t cramps. Down 1 lb overnight. CVP 5. May need to switch to po diuretic. Scr trending up,  1.04 > 1.16 > 1.43. - Will decide on diuretic dosing pending RHC this afternoon. If pulmonary pressures still high will place IJ swan and move to ICU for milrinone +/- NO in effort to get pressures down pre-op - Continue spironolactone 12.5 daily - UNNA boots    2. Pulmonary HTN - multifactorial, based on current workup primarily group 2 and 3  - She will need optimization of her inhaler regimen as well as a sleep study - She is seeing Dr. Blenda Nicely  - Stressed need to stop smoking   3. Mitral valve disease, severe -TEE 04/29/2021 EF 60-65%, normal RV size and function, Mod MS/Severe MR, likely burnt out vegetation on both leaflets, TR mild-mod - Ideally she will need to valve replaced but currently not a surgical candidate - Has seen Dr. Laneta Simmers and agrees with need for surgical MVR but has to stop smoking completely. - With markedly elevated PCWP on recent cath will not start selective pulmonary artery vasodilators at this time - As above with good diuresis w/ IV Lasix. Additional diuretic dosing depending on RHC today - Will d/w Dr. Laneta Simmers regarding timing of surgery. May be best to do while in house as she won't be smoking and may be hard to keep her tuned up as outpatient.    4. COPD with ongoing tobacco use - Following with Dr. Blenda Nicely - Discussed smoking cessation    5. Opioid Use Disorder -sees Dr. Nedra Hai in [place] who prescribes suboxone -has not used IV opiates or cocaine in 2 years per her report    6. HCV -needs ID follow up eventually  7. Diabetes - last  Hgb A1c 2017 was 9.0 - CBGs elevated 200-300s - Increasing long-acting insulin this am per diabetes coordinator. Appreciate assistance. - Repeat A1c pending    Length of Stay: 5  FINCH, LINDSAY N, PA-C  07/20/2021, 7:05 AM  Advanced Heart Failure Team Pager [phone] (M-F; 7a - 5p)  Please contact [facility] for night-coverage after hours (5p -7a ) and weekends on [email or web address]   Patient seen and examined with the above-signed Advanced Practice Provider and/or Housestaff. I personally reviewed laboratory data, imaging studies and relevant notes. I independently examined the patient and formulated the important aspects of the plan. I have edited the note to reflect any of my changes or salient points. I have personally discussed the plan with the patient and/or family.  Volume status much improved. Breathing better. No orthopnea or PND. SCr up with diuresis.   General:  Well appearing. No resp difficulty HEENT: normal Neck: supple. no JVD. Carotids 2+ bilat; no bruits. No lymphadenopathy or thryomegaly appreciated. Cor: PMI nondisplaced. Regular rate & rhythm. 2/6 MR/TR Lungs: clear Abdomen: obese soft, nontender, nondistended.  No hepatosplenomegaly. No bruits or masses. Good bowel sounds. Extremities: no cyanosis, clubbing, rash, edema Neuro: alert & orientedx3, cranial nerves grossly intact. moves all 4 extremities w/o difficulty. Affect pleasant  Volume status much improved. SCr up. This is likely as dry as we can get her. For RHC today. If PA pressures still high will need swan and transfer to CCU for milrinone +/- NO to get PA pressures down for possible MVR on 12/5.  Arvilla Meres, MD  12:48 PM

## 2021-07-20 NOTE — Interval H&P Note (Signed)
History and Physical Interval Note:  07/20/2021 12:44 PM  Emily Larson  has presented today for surgery, with the diagnosis of heart failure.  The various methods of treatment have been discussed with the patient and family. After consideration of risks, benefits and other options for treatment, the patient has consented to  Procedure(s): RIGHT HEART CATH (N/A) as a surgical intervention.  The patient's history has been reviewed, patient examined, no change in status, stable for surgery.  I have reviewed the patient's chart and labs.  Questions were answered to the patient's satisfaction.     Crystalina Stodghill

## 2021-07-20 NOTE — Progress Notes (Signed)
Orthopedic Tech Progress Note Patient Details:  Emily Larson 1976-01-26 [id number]  Ortho Devices Type of Ortho Device: Radio broadcast assistant Ortho Device/Splint Location: Bi LE Ortho Device/Splint Interventions: Application   Post Interventions Patient Tolerated: Well  [name] 07/20/2021, 4:11 PM

## 2021-07-20 NOTE — H&P (View-Only) (Signed)
Patient ID: Emily Larson, female   DOB: 1976-07-25, 45 y.o.   MRN: [id number]     Advanced Heart Failure Rounding Note  PCP-Cardiologist: None   Subjective:    Co-ox 65%. Continues on lasix gtt at 8 mg/hr. Less cramps noted after reducing lasix rate. CVP 5.  Weight down 1 lb overnight, negative 1.8L, did have unmeasured void  Scr 1.31 > 1.04 > 1.16 > 1.46 > 1.43  K 3.9  Na 134 > 133 > 132 > 129  Going for RHC this afternoon  Dyspnea improving. Still feels a little bloating in abdomen.   Objective:   Weight Range: 79.5 kg Body mass index is 27.45 kg/m.   Vital Signs:   Temp:  [97.6 F (36.4 C)-98.7 F (37.1 C)] 97.6 F (36.4 C) (11/30 0423) Pulse Rate:  [80-82] 80 (11/30 0423) Resp:  [11-19] 18 (11/30 0423) BP: (102-114)/(66-86) 114/85 (11/30 0423) SpO2:  [96 %-98 %] 96 % (11/29 2357) Weight:  [79.5 kg] 79.5 kg (11/30 0423) Last BM Date: 07/17/21  Weight change: Filed Weights   07/18/21 0446 07/19/21 0408 07/20/21 0423  Weight: 83.2 kg 80.1 kg 79.5 kg    Intake/Output:   Intake/Output Summary (Last 24 hours) at 07/20/2021 0705 Last data filed at 07/20/2021 0425 Gross per 24 hour  Intake 2159.36 ml  Output 4000 ml  Net -1840.64 ml      Physical Exam   CVP 5 sitting in chair General:  No distress HEENT: normal Neck: supple. JVP 7 cm. Carotids 2+ bilat; no bruits.  Cor: PMI nondisplaced. Regular rate & rhythm. No rubs, gallops, 3/6 holosystolic murmur at pex Lungs: clear Abdomen: soft, nontender, nondistended. Extremities: no cyanosis, clubbing, rash, trace BLE edema, UNNA boots Neuro: alert & orientedx3, cranial nerves grossly intact. moves all 4 extremities w/o difficulty. Affect pleasant     Telemetry   NSR 60s-70s  (personally reviewed)  Labs    CBC Recent Labs    07/19/21 0430  WBC 11.2*  HGB 17.8*  HCT 53.2*  MCV 87.1  PLT 148*   Basic Metabolic Panel Recent Labs    86/38/17 0430 07/20/21 0500  NA 132* 129*  K 4.0 3.9  CL  87* 90*  CO2 34* 29  GLUCOSE 247* 256*  BUN 44* 53*  CREATININE 1.46* 1.43*  CALCIUM 10.2 9.3  MG 2.3  --    Liver Function Tests No results for input(s): AST, ALT, ALKPHOS, BILITOT, PROT, ALBUMIN in the last 72 hours.  No results for input(s): LIPASE, AMYLASE in the last 72 hours. Cardiac Enzymes No results for input(s): CKTOTAL, CKMB, CKMBINDEX, TROPONINI in the last 72 hours.  BNP: BNP (last 3 results) Recent Labs    05/27/21 1044 07/12/21 1018 07/15/21 1429  BNP 928.3* 667.6* 671.5*    ProBNP (last 3 results) Recent Labs    04/22/21 1147  PROBNP 3,347*     D-Dimer No results for input(s): DDIMER in the last 72 hours. Hemoglobin A1C No results for input(s): HGBA1C in the last 72 hours. Fasting Lipid Panel No results for input(s): CHOL, HDL, LDLCALC, TRIG, CHOLHDL, LDLDIRECT in the last 72 hours. Thyroid Function Tests No results for input(s): TSH, T4TOTAL, T3FREE, THYROIDAB in the last 72 hours.  Invalid input(s): FREET3   Other results:   Imaging    VAS US DOPPLER PRE CABG  Result Date: 07/19/2021 PREOPERATIVE VASCULAR EVALUATION Patient Name:  [facility] Dziedzic  Date of Exam:   07/19/2021 Medical Rec #: [id number]   Accession #:  [id number] Date of Birth: 06-17-1976   Patient Gender: F Patient Age:   35 years Exam Location:  [facility] Procedure:      VAS US DOPPLER PRE CABG Referring Phys: Evelene Croon --------------------------------------------------------------------------------  Indications:      Pre op MVR. Risk Factors:     Hypertension, hyperlipidemia, Diabetes. Comparison Study: no prior Performing Technologist: [place] Ponder RVS  Examination Guidelines: A complete evaluation includes B-mode imaging, spectral Doppler, color Doppler, and power Doppler as needed of all accessible portions of each vessel. Bilateral testing is considered an integral part of a complete examination. Limited examinations for reoccurring indications may be performed as  noted.  Right Carotid Findings: +----------+--------+--------+--------+------------+--------+           PSV cm/sEDV cm/sStenosisDescribe    Comments +----------+--------+--------+--------+------------+--------+ CCA Prox  73      13              heterogenous         +----------+--------+--------+--------+------------+--------+ CCA Distal54      11              heterogenous         +----------+--------+--------+--------+------------+--------+ ICA Prox  71      24      1-39%   heterogenous         +----------+--------+--------+--------+------------+--------+ ICA Distal94      31                                   +----------+--------+--------+--------+------------+--------+ ECA       109     11                                   +----------+--------+--------+--------+------------+--------+ +----------+--------+-------+--------+------------+           PSV cm/sEDV cmsDescribeArm Pressure +----------+--------+-------+--------+------------+ Subclavian97                                  +----------+--------+-------+--------+------------+ +---------+--------+--+--------+-+---------+ VertebralPSV cm/s22EDV cm/s8Antegrade +---------+--------+--+--------+-+---------+ Left Carotid Findings: +----------+--------+--------+--------+------------+--------+           PSV cm/sEDV cm/sStenosisDescribe    Comments +----------+--------+--------+--------+------------+--------+ CCA Prox  92      9               heterogenous         +----------+--------+--------+--------+------------+--------+ CCA Distal83      12              heterogenous         +----------+--------+--------+--------+------------+--------+ ICA Prox  69      14      1-39%   heterogenous         +----------+--------+--------+--------+------------+--------+ ICA Distal81      27                                   +----------+--------+--------+--------+------------+--------+ ECA        75      9                                    +----------+--------+--------+--------+------------+--------+ +----------+--------+--------+--------+------------+ SubclavianPSV cm/sEDV cm/sDescribeArm Pressure +----------+--------+--------+--------+------------+  144                                  +----------+--------+--------+--------+------------+ +---------+--------+--+--------+--+---------+ VertebralPSV cm/s40EDV cm/s10Antegrade +---------+--------+--+--------+--+---------+  ABI Findings: +--------+------------------+-----+---------+--------+ Right   Rt Pressure (mmHg)IndexWaveform Comment  +--------+------------------+-----+---------+--------+ Brachial                       triphasic         +--------+------------------+-----+---------+--------+ +--------+------------------+-----+--------+-------+ Left    Lt Pressure (mmHg)IndexWaveformComment +--------+------------------+-----+--------+-------+ Brachial                       biphasic        +--------+------------------+-----+--------+-------+  Right Doppler Findings: +--------+--------+-----+---------+--------+ Site    PressureIndexDoppler  Comments +--------+--------+-----+---------+--------+ Brachial             triphasic         +--------+--------+-----+---------+--------+ Radial               biphasic          +--------+--------+-----+---------+--------+ Ulnar                triphasic         +--------+--------+-----+---------+--------+  Left Doppler Findings: +--------+--------+-----+----------+--------+ Site    PressureIndexDoppler   Comments +--------+--------+-----+----------+--------+ Brachial             biphasic           +--------+--------+-----+----------+--------+ Radial               monophasic         +--------+--------+-----+----------+--------+ Ulnar                monophasic         +--------+--------+-----+----------+--------+  Summary:  Right Carotid: Velocities in the right ICA are consistent with a 1-39% stenosis. Left Carotid: Velocities in the left ICA are consistent with a 1-39% stenosis. Vertebrals: Bilateral vertebral arteries demonstrate antegrade flow. Right Upper Extremity: Doppler waveforms remain within normal limits with right radial compression. Doppler waveform obliterate with right ulnar compression. Left Upper Extremity: Doppler waveforms remain within normal limits with left radial compression. Doppler waveforms remain within normal limits with left ulnar compression.  Electronically signed by Coral Else MD on 07/19/2021 at 7:03:42 PM.    Final      Medications:     Scheduled Medications:  buprenorphine-naloxone  1 tablet Sublingual BID   cariprazine  1.5 mg Oral Daily   Chlorhexidine Gluconate Cloth  6 each Topical Daily   enoxaparin (LOVENOX) injection  40 mg Subcutaneous Q24H   famotidine  40 mg Oral Daily   fluticasone furoate-vilanterol  1 puff Inhalation Daily   insulin aspart  0-5 Units Subcutaneous QHS   insulin aspart  0-9 Units Subcutaneous TID WC   insulin aspart  4 Units Subcutaneous TID WC   insulin glargine-yfgn  16 Units Subcutaneous Daily   nicotine  21 mg Transdermal Daily   potassium chloride  30 mEq Oral BID   pregabalin  150 mg Oral TID   sodium chloride flush  10-40 mL Intracatheter Q12H   sodium chloride flush  3 mL Intravenous Q12H   sodium chloride flush  3 mL Intravenous Q12H   spironolactone  12.5 mg Oral Daily   traMADol  50 mg Oral Q8H   umeclidinium bromide  1 puff Inhalation Daily    Infusions:  sodium chloride     sodium chloride     sodium  chloride     furosemide (LASIX) 200 mg in dextrose 5% 100 mL (2mg/mL) infusion 8 mg/hr (07/19/21 1114)    PRN Medications: sodium chloride, sodium chloride, acetaminophen, albuterol, ondansetron (ZOFRAN) IV, sodium chloride flush, sodium chloride flush, sodium chloride flush    Assessment/Plan   1. Acute on Chronic  Diastolic HF due to severe mitral valve disease -TEE 04/29/2021 EF 60-65%, normal RV size and function, Severe MR,  Mod MS likely burnt out vegetation on both leaflets, TR mild-mod - NYHA IIIb-IV with R>L HF - Marked volume overload despite increased home diuretic regimen. - Wt down 20 lb on lasix gtt at 15/hr. Reduced to 8/hr yesterday d/t cramps. Down 1 lb overnight. CVP 5. May need to switch to po diuretic. Scr trending up,  1.04 > 1.16 > 1.43. - Will decide on diuretic dosing pending RHC this afternoon. If pulmonary pressures still high will place IJ swan and move to ICU for milrinone +/- NO in effort to get pressures down pre-op - Continue spironolactone 12.5 daily - UNNA boots    2. Pulmonary HTN - multifactorial, based on current workup primarily group 2 and 3  - She will need optimization of her inhaler regimen as well as a sleep study - She is seeing Dr. Chodri  - Stressed need to stop smoking   3. Mitral valve disease, severe -TEE 04/29/2021 EF 60-65%, normal RV size and function, Mod MS/Severe MR, likely burnt out vegetation on both leaflets, TR mild-mod - Ideally she will need to valve replaced but currently not a surgical candidate - Has seen Dr. Bartle and agrees with need for surgical MVR but has to stop smoking completely. - With markedly elevated PCWP on recent cath will not start selective pulmonary artery vasodilators at this time - As above with good diuresis w/ IV Lasix. Additional diuretic dosing depending on RHC today - Will d/w Dr. Bartle regarding timing of surgery. May be best to do while in house as she won't be smoking and may be hard to keep her tuned up as outpatient.    4. COPD with ongoing tobacco use - Following with Dr. Chodri - Discussed smoking cessation    5. Opioid Use Disorder -sees Dr. Lee in [place] who prescribes suboxone -has not used IV opiates or cocaine in 2 years per her report    6. HCV -needs ID follow up eventually  7. Diabetes - last  Hgb A1c 2017 was 9.0 - CBGs elevated 200-300s - Increasing long-acting insulin this am per diabetes coordinator. Appreciate assistance. - Repeat A1c pending    Length of Stay: 5  FINCH, LINDSAY N, PA-C  07/20/2021, 7:05 AM  Advanced Heart Failure Team Pager [phone] (M-F; 7a - 5p)  Please contact [facility] for night-coverage after hours (5p -7a ) and weekends on [email or web address]   Patient seen and examined with the above-signed Advanced Practice Provider and/or Housestaff. I personally reviewed laboratory data, imaging studies and relevant notes. I independently examined the patient and formulated the important aspects of the plan. I have edited the note to reflect any of my changes or salient points. I have personally discussed the plan with the patient and/or family.  Volume status much improved. Breathing better. No orthopnea or PND. SCr up with diuresis.   General:  Well appearing. No resp difficulty HEENT: normal Neck: supple. no JVD. Carotids 2+ bilat; no bruits. No lymphadenopathy or thryomegaly appreciated. Cor: PMI nondisplaced. Regular rate & rhythm. 2/6 MR/TR Lungs: clear Abdomen: obese soft, nontender, nondistended.   No hepatosplenomegaly. No bruits or masses. Good bowel sounds. Extremities: no cyanosis, clubbing, rash, edema Neuro: alert & orientedx3, cranial nerves grossly intact. moves all 4 extremities w/o difficulty. Affect pleasant  Volume status much improved. SCr up. This is likely as dry as we can get her. For RHC today. If PA pressures still high will need swan and transfer to CCU for milrinone +/- NO to get PA pressures down for possible MVR on 12/5.  Arvilla Meres, MD  12:48 PM

## 2021-07-20 NOTE — Progress Notes (Signed)
Pt currently as a PICC line with ports infusing and 1 PIV. U/S guided PIV placed for heart cath and spoke with primary RN to remove the old PIV. Verbalized understanding.

## 2021-07-20 NOTE — Interval H&P Note (Signed)
History and Physical Interval Note:  07/20/2021 2:31 PM  Emily Larson  has presented today for surgery, with the diagnosis of heart failure.  The various methods of treatment have been discussed with the patient and family. After consideration of risks, benefits and other options for treatment, the patient has consented to  Procedure(s): RIGHT HEART CATH (N/A) as a surgical intervention.  The patient's history has been reviewed, patient examined, no change in status, stable for surgery.  I have reviewed the patient's chart and labs.  Questions were answered to the patient's satisfaction.     Lucky Trotta

## 2021-07-20 NOTE — Progress Notes (Signed)
Inpatient Diabetes Program Recommendations  AACE/ADA: New Consensus Statement on Inpatient Glycemic Control (2015)  Target Ranges:  Prepandial:   less than 140 mg/dL      Peak postprandial:   less than 180 mg/dL (1-2 hours)      Critically ill patients:  140 - 180 mg/dL    Latest Reference Range & Units 07/19/21 06:38 07/19/21 11:23 07/19/21 16:46 07/19/21 21:06  Glucose-Capillary 70 - 99 mg/dL 517 (H)  11 units Novolog  364 (H)  15 units Novolog @1300   10 units Semglee @1110   6 units Semglee @1302  105 (H) 245 (H)  2 units Novolog     Latest Reference Range & Units 07/20/21 06:10  Glucose-Capillary 70 - 99 mg/dL (H)  3 units Novolog       Home DM Meds: Semaglutide 3 mg Daily   Current Orders: Semglee 16 units Daily      Novolog Sensitive Correction Scale/ SSI (0-9 units) TID AC + HS      Novolog 4 units TID with meals     MD- Note Semglee insulin increased yesterday.  AM CBG remains >200 today  Please consider increasing the Semglee further to 20 units Daily (0.25 units/kg based on weight of 79.5kg)  Pt was educated on how to use insulin pen at home if decision made to discharge home on insulin once A1c results are available     --Will follow patient during hospitalization--  RN, MSN, CDE Diabetes Coordinator Inpatient Glycemic Control Team Team Pager: [phone] (8a-5p)

## 2021-07-21 ENCOUNTER — Inpatient Hospital Stay (HOSPITAL_COMMUNITY): Payer: Medicare Other

## 2021-07-21 ENCOUNTER — Encounter (HOSPITAL_COMMUNITY): Payer: Self-pay | Admitting: Internal Medicine

## 2021-07-21 ENCOUNTER — Telehealth (HOSPITAL_COMMUNITY): Payer: Self-pay | Admitting: Pharmacist

## 2021-07-21 ENCOUNTER — Other Ambulatory Visit (HOSPITAL_COMMUNITY): Payer: Self-pay

## 2021-07-21 DIAGNOSIS — I340 Nonrheumatic mitral (valve) insufficiency: Secondary | ICD-10-CM

## 2021-07-21 DIAGNOSIS — I5033 Acute on chronic diastolic (congestive) heart failure: Secondary | ICD-10-CM

## 2021-07-21 LAB — GLUCOSE, CAPILLARY
Glucose-Capillary: 259 mg/dL — ABNORMAL HIGH (ref 70–99)
Glucose-Capillary: 262 mg/dL — ABNORMAL HIGH (ref 70–99)
Glucose-Capillary: 271 mg/dL — ABNORMAL HIGH (ref 70–99)
Glucose-Capillary: 396 mg/dL — ABNORMAL HIGH (ref 70–99)

## 2021-07-21 LAB — COOXEMETRY PANEL
Carboxyhemoglobin: 1.9 % — ABNORMAL HIGH (ref 0.5–1.5)
Methemoglobin: 1 % (ref 0.0–1.5)
O2 Saturation: 78.2 %
Total hemoglobin: 15.7 g/dL (ref 12.0–16.0)

## 2021-07-21 LAB — BASIC METABOLIC PANEL
Anion gap: 10 (ref 5–15)
BUN: 54 mg/dL — ABNORMAL HIGH (ref 6–20)
CO2: 26 mmol/L (ref 22–32)
Calcium: 8.9 mg/dL (ref 8.9–10.3)
Chloride: 97 mmol/L — ABNORMAL LOW (ref 98–111)
Creatinine, Ser: 1.35 mg/dL — ABNORMAL HIGH (ref 0.44–1.00)
GFR, Estimated: 50 mL/min — ABNORMAL LOW (ref >60)
Glucose, Bld: 219 mg/dL — ABNORMAL HIGH (ref 70–99)
Potassium: 4 mmol/L (ref 3.5–5.1)
Sodium: 133 mmol/L — ABNORMAL LOW (ref 135–145)

## 2021-07-21 LAB — URIC ACID: Uric Acid, Serum: 11.6 mg/dL — ABNORMAL HIGH (ref 2.5–7.1)

## 2021-07-21 MED ORDER — PREDNISONE 20 MG PO TABS
40.0000 mg | ORAL_TABLET | Freq: Every day | ORAL | Status: AC
  Administered 2021-07-21 – 2021-07-23 (×3): 40 mg via ORAL
  Filled 2021-07-21 (×3): qty 2

## 2021-07-21 MED ORDER — INSULIN GLARGINE-YFGN 100 UNIT/ML ~~LOC~~ SOLN
25.0000 [IU] | Freq: Every day | SUBCUTANEOUS | Status: DC
Start: 2021-07-22 — End: 2021-07-22
  Administered 2021-07-22: 25 [IU] via SUBCUTANEOUS
  Filled 2021-07-21: qty 0.25

## 2021-07-21 MED ORDER — TADALAFIL 20 MG PO TABS
40.0000 mg | ORAL_TABLET | Freq: Every day | ORAL | Status: DC
  Administered 2021-07-21 – 2021-07-23 (×3): 40 mg via ORAL
  Filled 2021-07-21 (×3): qty 2

## 2021-07-21 MED ORDER — MACITENTAN 10 MG PO TABS
10.0000 mg | ORAL_TABLET | Freq: Every day | ORAL | Status: DC
  Administered 2021-07-21 – 2021-07-23 (×3): 10 mg via ORAL
  Filled 2021-07-21 (×3): qty 1

## 2021-07-21 NOTE — Telephone Encounter (Signed)
Advanced Heart Failure Patient Advocate Encounter  Prior Authorization for tadalafil has been approved.    PA# [id number] Effective dates: 07/21/21 through 08/20/22  Karle Plumber, PharmD, BCPS, BCCP, CPP Heart Failure Clinic Pharmacist [phone]

## 2021-07-21 NOTE — Progress Notes (Signed)
Pharmacy Consult for Pulmonary Hypertension Treatment   Indication - Initiation of PAH medication while hospitalized  Patient is 45 y.o. and will be newly started on Macitentan (Opsumit).   In order to use this medication as an inpatient, monitoring parameters per REMS requirements must be met.  Chronic therapy will under the supervision of Dr Haroldine Laws who is enrolled in the REMS program and is initiating therapy.  On admission pregnancy risk has been assessed and pregnancy test dated 07/20/21 was negative. Hepatic function has been evaluated. AST/ALT appropriate to continue medication at this time.   Hepatic Function Latest Ref Rng & Units 07/15/2021 05/27/2021 04/22/2021  Total Protein 6.5 - 8.1 g/dL 7.6 7.5 7.3  Albumin 3.5 - 5.0 g/dL 3.9 3.9 4.3  AST 15 - 41 U/L $Remo'20 23 17  'Aziep$ ALT 0 - 44 U/L $Remo'13 15 10  'juzNH$ Alk Phosphatase 38 - 126 U/L 100 103 121  Total Bilirubin 0.3 - 1.2 mg/dL 1.3(H) 1.4(H) 0.8     The patient has been educated on Pregnancy Risk and Hepatotoxicity  and REMS form has been signed and submitted to [facility]  If any question arise or pregnancy is identified during hospitalization, contact for bosentan and macitentan: [phone]; ambrisentan: [phone].  Thank for you allowing [place] to participate in the care of this patient.   Bonnita Nasuti Pharm.D. CPP, BCPS Clinical Pharmacist [phone] 07/21/2021 3:48 PM

## 2021-07-21 NOTE — Telephone Encounter (Signed)
Advanced Heart Failure Patient Advocate Encounter  Prior Authorization for Opsumit has been approved.    PA# [id number] Effective dates: 07/21/21 through 08/20/22  Karle Plumber, PharmD, BCPS, BCCP, CPP Heart Failure Clinic Pharmacist [phone]

## 2021-07-21 NOTE — Progress Notes (Signed)
1 Day Post-Op Procedure(s) (LRB): RIGHT HEART CATH (N/A) Subjective:  Feels like breathing is a little easier with milrinone and diuresis.   Milrinone 0.25, Cialis 40 mg daily  PA 90's/40, almost systemic, CVP 2 CI 2.9, Co-ox 74, 78 Objective: Vital signs in last 24 hours: Temp:  [97 F (36.1 C)-98.1 F (36.7 C)] 97.3 F (36.3 C) (12/01 1600) Pulse Rate:  [63-153] 67 (12/01 1600) Cardiac Rhythm: Normal sinus rhythm (12/01 1600) Resp:  [9-24] 13 (12/01 1600) BP: (86-147)/(47-121) 109/64 (12/01 1600) SpO2:  [88 %-99 %] 95 % (12/01 1600) Weight:  [79.7 kg] 79.7 kg (12/01 0500)  Hemodynamic parameters for last 24 hours: PAP: (56-106)/(23-53) 93/33 CVP:  [0 mmHg-15 mmHg] 2 mmHg CO:  [3.7 L/min-5.5 L/min] 5.5 L/min CI:  [1.9 L/min/m2-2.9 L/min/m2] 2.9 L/min/m2  Intake/Output from previous day: 11/30 0701 - 12/01 0700 In: 1856.9 [P.O.:1400; I.V.:456.9] Out: 2275 [Urine:2275] Intake/Output this shift: Total I/O In: 849.8 [P.O.:720; I.V.:129.8] Out: 700 [Urine:700]  General appearance: alert and cooperative Neurologic: intact Heart: regular rate and rhythm, S1, S2 normal, 3/6 systolic murmur apex. Lungs: wheezes bilaterally Extremities: edema mild bilateral LE  Lab Results: Recent Labs    07/19/21 0430 07/20/21 1308 07/20/21 1309  WBC 11.2*  --   --   HGB 17.8* 19.7* 19.7*  HCT 53.2* 58.0* 58.0*  PLT 148*  --   --    BMET:  Recent Labs    07/20/21 0500 07/20/21 1308 07/20/21 1309 07/21/21 0357  NA 129*   < > 136 133*  K 3.9   < > 3.8 4.0  CL 90*  --   --  97*  CO2 29  --   --  26  GLUCOSE 256*  --   --  219*  BUN 53*  --   --  54*  CREATININE 1.43*  --   --  1.35*  CALCIUM 9.3  --   --  8.9   < > = values in this interval not displayed.    PT/INR: No results for input(s): LABPROT, INR in the last 72 hours. ABG    Component Value Date/Time   PHART 7.376 04/29/2021 1023   HCO3 29.3 (H) 07/20/2021 1309   TCO2 31 07/20/2021 1309   ACIDBASEDEF 1.0  04/23/2016 1212   O2SAT 78.2 07/21/2021 0357   CBG (last 3)  Recent Labs    07/20/21 2222 07/21/21 0609 07/21/21 1117  GLUCAP 298* 259* 262*    Assessment/Plan:  Acute on chronic diastolic heart failure due to severe MR, moderate MS probably related to old endocarditis. NYHA 3b-4 with R>L heart failure. DB is in the process of trying to optimize and see if PA pressures will come down some. If she has fixed pulmonary HTN at systemic levels she will not survive MVR. PFT's show severe obstructive disease and moderate restriction with moderate reduction in diffusion capacity. She says she quit smoking 5 days prior to admission. Orthopantogram done and waiting for Dr. Chales Salmon to evaluate. I tentatively have her on the schedule for Monday depending on if we can get PA pressures to decrease some and pending eval by dentisty. I discussed all of this with her.  LOS: 6 days    Alleen Borne 07/21/2021

## 2021-07-21 NOTE — Telephone Encounter (Signed)
Sent Opsumit enrollment application and REMS enrollment form to [employer].    Karle Plumber, PharmD, BCPS, BCCP, CPP Heart Failure Clinic Pharmacist [phone]

## 2021-07-21 NOTE — Plan of Care (Signed)
Paged by nursing regarding lower systemic pressures overnight.  Evaluated at bedside, CVP 0-2, attempted wedge but swan secured and did not want to unbandage while sleeping comfortably with normal wedge on PAC this afternoon.  Lasix infusion stopped.  Defer decision regarding oral diuretic regimen to rounding heart failure service.  Luanna Salk, MD Cardiology Fellow [facility]

## 2021-07-21 NOTE — Progress Notes (Signed)
Inpatient Diabetes Program Recommendations  AACE/ADA: New Consensus Statement on Inpatient Glycemic Control (2015)  Target Ranges:  Prepandial:   less than 140 mg/dL      Peak postprandial:   less than 180 mg/dL (1-2 hours)      Critically ill patients:  140 - 180 mg/dL   Lab Results  Component Value Date   GLUCAP 262 (H) 07/21/2021   HGBA1C 10.2 (H) 07/17/2021    Review of Glycemic Control  Latest Reference Range & Units 07/19/21 21:06 07/20/21 06:10 07/20/21 11:35 07/20/21 15:46 07/20/21 22:22 07/21/21 06:09 07/21/21 11:17  Glucose-Capillary 70 - 99 mg/dL 621 (H) 308 (H) 657 (H) 147 (H) 298 (H) 259 (H) 262 (H)   Diabetes history: DM 2  Outpatient Diabetes medications:   Home DM Meds: Semaglutide 3 mg Daily     Current Orders: Semglee 20 units Daily                            Novolog Sensitive Correction Scale/ SSI (0-9 units) TID AC + HS                            Novolog 4 units TID with meals  Inpatient Diabetes Program Recommendations:    Note patient did not receive Semglee 20 units on yesterday (11/30) due to procedure.  Do not recommend any changes in insulin today.   Thanks,  Beryl Meager, RN, BC-ADM Inpatient Diabetes Coordinator Pager [phone]  (8a-5p)

## 2021-07-21 NOTE — Progress Notes (Signed)
Patient ID: Emily Larson, female   DOB: Dec 11, 1975, 45 y.o.   MRN: [id number]     Advanced Heart Failure Rounding Note  PCP-Cardiologist: None   Subjective:    RHC 11/20 demonstrated severe PAH w/ reduced CO and essentially normal left sided pressures. Moved to ICU and started on milrinone and sildenafil.   RHC Findings  RA = 5 RV = 108/3 PA = 101/52 (71) PCW = 18 (no significant v waves) Fick cardiac output/index = 2.9/1.5 PVR = 18.6 WU PAPi = 10  Ao sat = 95% PA sat = 60%, 61%  On Milrinone 0.25. Sildenafil 20 tid. Co-ox 78%. Repeat pending.   Low systemic pressures overnight. CVP was low, 0-2. Lasix infusion stopped by on call MD.    PA pressures improved but remain elevated, 79/34. CVP 2. PCWP not done yet.    Sitting up on side of bed. Feels tired but better than yesterday. No resting dyspnea. SBPs now low 100s. Remains off lasix gtt.   Swan #s  CVP 2 PAP 79/34 (49)  CO 3.7 CI 1.9  Co-ox 78%>>repeat pending.  PAPi 22.5  Only 700cc net negative yesterday. Wt unchanged at 170 lb  Scr 1.31 > 1.04 > 1.16 > 1.46 > 1.43>1.35  K 4.0   Na 134 > 133 > 132 > 129>133     Objective:   Weight Range: 79.7 kg Body mass index is 27.53 kg/m.   Vital Signs:   Temp:  [97 F (36.1 C)-98.1 F (36.7 C)] 98.1 F (36.7 C) (12/01 0500) Pulse Rate:  [0-275] 95 (12/01 0500) Resp:  [0-24] 22 (12/01 0500) BP: (85-150)/(47-121) 114/79 (12/01 0500) SpO2:  [0 %-99 %] 96 % (12/01 0500) Weight:  [79.7 kg] 79.7 kg (12/01 0500) Last BM Date: 07/19/21  Weight change: Filed Weights   07/19/21 0408 07/20/21 0423 07/21/21 0500  Weight: 80.1 kg 79.5 kg 79.7 kg    Intake/Output:   Intake/Output Summary (Last 24 hours) at 07/21/2021 0601 Last data filed at 07/21/2021 0500 Gross per 24 hour  Intake 1324.97 ml  Output 2025 ml  Net -700.03 ml      Physical Exam   CVP 2  General:  fatigued appearing, sitting up on side of bed. No respiratory difficulty HEENT: normal Neck:  supple. no JVD. + swan Lt IJ Carotids 2+ bilat; no bruits. No lymphadenopathy or thyromegaly appreciated. Cor: PMI nondisplaced. Regular rate & rhythm. 3/6 HSM Apex  Abdomen: soft, nontender, nondistended. No hepatosplenomegaly. No bruits or masses. Good bowel sounds. Extremities: no cyanosis, clubbing, rash, trace bilateral LEE edema +Unna boots  Neuro: alert & oriented x 3, cranial nerves grossly intact. moves all 4 extremities w/o difficulty. Affect pleasant.   Telemetry   NSR 82 bpm  (personally reviewed)  Labs    CBC Recent Labs    07/19/21 0430 07/20/21 1308 07/20/21 1309  WBC 11.2*  --   --   HGB 17.8* 19.7* 19.7*  HCT 53.2* 58.0* 58.0*  MCV 87.1  --   --   PLT 148*  --   --    Basic Metabolic Panel Recent Labs    96/29/52 0430 07/20/21 0500 07/20/21 1308 07/20/21 1309 07/21/21 0357  NA 132* 129*   < > 136 133*  K 4.0 3.9   < > 3.8 4.0  CL 87* 90*  --   --  97*  CO2 34* 29  --   --  26  GLUCOSE 247* 256*  --   --  219*  BUN 44* 53*  --   --  54*  CREATININE 1.46* 1.43*  --   --  1.35*  CALCIUM 10.2 9.3  --   --  8.9  MG 2.3  --   --   --   --    < > = values in this interval not displayed.   Liver Function Tests No results for input(s): AST, ALT, ALKPHOS, BILITOT, PROT, ALBUMIN in the last 72 hours.  No results for input(s): LIPASE, AMYLASE in the last 72 hours. Cardiac Enzymes No results for input(s): CKTOTAL, CKMB, CKMBINDEX, TROPONINI in the last 72 hours.  BNP: BNP (last 3 results) Recent Labs    05/27/21 1044 07/12/21 1018 07/15/21 1429  BNP 928.3* 667.6* 671.5*    ProBNP (last 3 results) Recent Labs    04/22/21 1147  PROBNP 3,347*     D-Dimer No results for input(s): DDIMER in the last 72 hours. Hemoglobin A1C No results for input(s): HGBA1C in the last 72 hours. Fasting Lipid Panel No results for input(s): CHOL, HDL, LDLCALC, TRIG, CHOLHDL, LDLDIRECT in the last 72 hours. Thyroid Function Tests No results for input(s): TSH,  T4TOTAL, T3FREE, THYROIDAB in the last 72 hours.  Invalid input(s): FREET3   Other results:   Imaging    CARDIAC CATHETERIZATION  Result Date: 07/20/2021 Findings: RA = 5 RV = 108/3 PA = 101/52 (71) PCW = 18 (no significant v waves) Fick cardiac output/index = 2.9/1.5 PVR = 18.6 WU PAPi = 10 Ao sat = 95% PA sat = 60%, 61% Assessment: 1. Severe PAH with reduced CO 2. Essentially normal left-sided pressures Plan/Discussion: Move to ICU start milrinone and sildenafil to see if we can lower PA pressures. May need inhaled NO +/- IV epoprostenol. Arvilla Meres, MD 2:41 PM  Port CXR  Result Date: 07/20/2021 CLINICAL DATA:  Central line placement EXAM: PORTABLE CHEST 1 VIEW COMPARISON:  07/16/2021 FINDINGS: Left IJ central line tip overlies the main pulmonary artery. Right PICC line tip overlies SVC. No pneumothorax. Slightly improved lung aeration with probable mild interstitial edema. Trace right pleural effusion. IMPRESSION: Lines as above.  No pneumothorax. Slightly improved lung aeration with probable mild interstitial edema. Trace right pleural effusion. Electronically Signed   By: Guadlupe Spanish M.D.   On: 07/20/2021 15:50     Medications:     Scheduled Medications:  buprenorphine-naloxone  1 tablet Sublingual BID   cariprazine  1.5 mg Oral Daily   Chlorhexidine Gluconate Cloth  6 each Topical Daily   enoxaparin (LOVENOX) injection  40 mg Subcutaneous Q24H   famotidine  40 mg Oral Daily   fluticasone furoate-vilanterol  1 puff Inhalation Daily   insulin aspart  0-5 Units Subcutaneous QHS   insulin aspart  0-9 Units Subcutaneous TID WC   insulin aspart  4 Units Subcutaneous TID WC   insulin glargine-yfgn  20 Units Subcutaneous Daily   nicotine  21 mg Transdermal Daily   potassium chloride  40 mEq Oral BID   pregabalin  150 mg Oral TID   sildenafil  20 mg Oral TID   sodium chloride flush  10-40 mL Intracatheter Q12H   sodium chloride flush  3 mL Intravenous Q12H   sodium  chloride flush  3 mL Intravenous Q12H   sodium chloride flush  3 mL Intravenous Q12H   spironolactone  12.5 mg Oral Daily   traMADol  50 mg Oral Q8H   umeclidinium bromide  1 puff Inhalation Daily    Infusions:  sodium chloride 20  mL/hr at 07/20/21 1619   sodium chloride     sodium chloride     sodium chloride 10 mL/hr at 07/21/21 0500   sodium chloride 10 mL/hr at 07/21/21 0500   milrinone 0.25 mcg/kg/min (07/21/21 0500)    PRN Medications: sodium chloride, sodium chloride, sodium chloride, acetaminophen, acetaminophen, albuterol, ondansetron (ZOFRAN) IV, ondansetron (ZOFRAN) IV, sodium chloride flush, sodium chloride flush, sodium chloride flush    Assessment/Plan   1. Acute on Chronic Diastolic HF due to severe mitral valve disease -TEE 04/29/2021 EF 60-65%, normal RV size and function, Severe MR,  Mod MS likely burnt out vegetation on both leaflets, TR mild-mod - NYHA IIIb-IV with R>L HF - Marked volume overload despite increased home diuretic regimen. - Diuresed 20 lb on lasix gtt  - RHC 11/30 showed severe PAH w/ reduced CO and essentially normal left sided pressures. Moved to ICU and started on milrinone and sildenafil to lower PA pressures for pre-op optimization prior to MVR - Lasix gtt now off w/ low systemic pressures and CVP 0-2.  - Spiro 12.5 mg  - UNNA boots    2. Pulmonary HTN - multifactorial, based on current workup primarily groups 1, 2 and 3  - RHC yesterday c/w PAH  - C/w milrinone and sildenafil. May need May need inhaled NO +/- IV epoprostenol.  - She will need optimization of her inhaler regimen as well as a sleep study - She is seeing Dr. Blenda Nicely  - Stressed need to stop smoking   3. Mitral valve disease, severe -TEE 04/29/2021 EF 60-65%, normal RV size and function, Mod MS/Severe MR, likely burnt out vegetation on both leaflets, TR mild-mod - Ideally she will need to valve replaced but currently not a surgical candidate - Has seen Dr. Laneta Simmers and agrees  with need for surgical MVR but has to stop smoking completely. - As above with good diuresis w/ IV Lasix.  - c/w milrinone + sildenafil to lower PA pressures for pre-op optimization prior to MVR - Will d/w Dr. Laneta Simmers regarding timing of surgery. May be best to do while in house as she won't be smoking and may be hard to keep her tuned up as outpatient.    4. COPD with ongoing tobacco use - Following with Dr. Blenda Nicely - Discussed smoking cessation    5. Opioid Use Disorder -sees Dr. Nedra Hai in [place] who prescribes suboxone -has not used IV opiates or cocaine in 2 years per her report    6. HCV -needs ID follow up eventually  7. Diabetes - last Hgb A1c 2017 was 9.0 - CBGs elevated 200-300s - Increasing long-acting insulin this am per diabetes coordinator. Appreciate assistance. - Repeat A1c pending    Length of Stay: [address], PA-C  07/21/2021, 6:01 AM  Advanced Heart Failure Team Pager [phone] (M-F; 7a - 5p)  Please contact [facility] for night-coverage after hours (5p -7a ) and weekends on [email or web address]    Agree with above.   RHC yesterday with persistently elevated pulmonary pressures. Started on milrinone and sildenafil. PA pressures down to 60-70s. Lasix gtt stopped.   General:  Sitting up in bed No resp difficulty HEENT: normal Neck: supple. LIJ swan  Carotids 2+ bilat; no bruits. No lymphadenopathy or thryomegaly appreciated. Cor: PMI nondisplaced. Regular rate & rhythm. No rubs, gallops or murmurs. Lungs: clear Abdomen: obese soft, nontender, nondistended. No hepatosplenomegaly. No bruits or masses. Good bowel sounds. Extremities: no cyanosis, clubbing, rash, trace edema Neuro: alert & orientedx3, cranial nerves  grossly intact. moves all 4 extremities w/o difficulty. Affect pleasant  Pulmonary pressures significantly improved with milrinone and sildenafil. D/w PharmD will switch sildenafil to tadalafil for ease of continuing as outpatient. Consider  addition of ERA. Will d/w Dr. Laneta Simmers. May need iNO or inhaled Flolan pre-op. LE pain likely gout with elevated uric acid. Will treat with steroids.   CRITICAL CARE Performed by: Arvilla Meres  Total critical care time: 35 minutes  Critical care time was exclusive of separately billable procedures and treating other patients.  Critical care was necessary to treat or prevent imminent or life-threatening deterioration.  Critical care was time spent personally by me (independent of midlevel providers or residents) on the following activities: development of treatment plan with patient and/or surrogate as well as nursing, discussions with consultants, evaluation of patient's response to treatment, examination of patient, obtaining history from patient or surrogate, ordering and performing treatments and interventions, ordering and review of laboratory studies, ordering and review of radiographic studies, pulse oximetry and re-evaluation of patient's condition.  Arvilla Meres, MD  9:26 AM

## 2021-07-22 ENCOUNTER — Other Ambulatory Visit (HOSPITAL_COMMUNITY): Payer: Medicare Other | Admitting: Dentistry

## 2021-07-22 ENCOUNTER — Encounter (HOSPITAL_COMMUNITY): Payer: Self-pay | Admitting: Anesthesiology

## 2021-07-22 ENCOUNTER — Encounter (HOSPITAL_COMMUNITY)
Admission: RE | Disposition: A | Discharge status: Other Acute Inpatient Hospital | Payer: Self-pay | Source: Ambulatory Visit | Attending: Internal Medicine

## 2021-07-22 ENCOUNTER — Inpatient Hospital Stay (HOSPITAL_COMMUNITY): Payer: Medicare Other

## 2021-07-22 DIAGNOSIS — K011 Impacted teeth: Secondary | ICD-10-CM

## 2021-07-22 DIAGNOSIS — K0850 Unsatisfactory restoration of tooth, unspecified: Secondary | ICD-10-CM

## 2021-07-22 DIAGNOSIS — K08109 Complete loss of teeth, unspecified cause, unspecified class: Secondary | ICD-10-CM

## 2021-07-22 DIAGNOSIS — K0530 Chronic periodontitis, unspecified: Secondary | ICD-10-CM

## 2021-07-22 DIAGNOSIS — K029 Dental caries, unspecified: Secondary | ICD-10-CM

## 2021-07-22 DIAGNOSIS — Z01818 Encounter for other preprocedural examination: Secondary | ICD-10-CM

## 2021-07-22 DIAGNOSIS — K036 Deposits [accretions] on teeth: Secondary | ICD-10-CM

## 2021-07-22 DIAGNOSIS — K030 Excessive attrition of teeth: Secondary | ICD-10-CM

## 2021-07-22 LAB — BASIC METABOLIC PANEL
Anion gap: 7 (ref 5–15)
BUN: 49 mg/dL — ABNORMAL HIGH (ref 6–20)
CO2: 24 mmol/L (ref 22–32)
Calcium: 9.5 mg/dL (ref 8.9–10.3)
Chloride: 100 mmol/L (ref 98–111)
Creatinine, Ser: 1.11 mg/dL — ABNORMAL HIGH (ref 0.44–1.00)
GFR, Estimated: >60 mL/min (ref >60)
Glucose, Bld: 323 mg/dL — ABNORMAL HIGH (ref 70–99)
Potassium: 4.1 mmol/L (ref 3.5–5.1)
Sodium: 131 mmol/L — ABNORMAL LOW (ref 135–145)

## 2021-07-22 LAB — GLUCOSE, CAPILLARY
Glucose-Capillary: 308 mg/dL — ABNORMAL HIGH (ref 70–99)
Glucose-Capillary: 300 mg/dL — ABNORMAL HIGH (ref 70–99)
Glucose-Capillary: 570 mg/dL (ref 70–99)
Glucose-Capillary: 447 mg/dL — ABNORMAL HIGH (ref 70–99)

## 2021-07-22 LAB — COOXEMETRY PANEL
Carboxyhemoglobin: 1.9 % — ABNORMAL HIGH (ref 0.5–1.5)
Methemoglobin: 0.7 % (ref 0.0–1.5)
O2 Saturation: 83.1 %
Total hemoglobin: 16.1 g/dL — ABNORMAL HIGH (ref 12.0–16.0)

## 2021-07-22 LAB — MAGNESIUM: Magnesium: 2.4 mg/dL (ref 1.7–2.4)

## 2021-07-22 LAB — GLUCOSE, RANDOM: Glucose, Bld: 584 mg/dL (ref 70–99)

## 2021-07-22 SURGERY — FLUOROSCOPY GUIDANCE
Anesthesia: LOCAL

## 2021-07-22 MED ORDER — PLASMA-LYTE A IV SOLN
INTRAVENOUS | Status: DC
  Filled 2021-07-22: qty 5

## 2021-07-22 MED ORDER — PHENYLEPHRINE HCL-NACL 20-0.9 MG/250ML-% IV SOLN
30.0000 ug/min | INTRAVENOUS | Status: DC
  Filled 2021-07-22: qty 250

## 2021-07-22 MED ORDER — NOREPINEPHRINE 4 MG/250ML-% IV SOLN
0.0000 ug/min | INTRAVENOUS | Status: DC
Start: 2021-07-25 — End: 2021-07-24
  Filled 2021-07-22: qty 250

## 2021-07-22 MED ORDER — INSULIN GLARGINE-YFGN 100 UNIT/ML ~~LOC~~ SOLN
5.0000 [IU] | Freq: Once | SUBCUTANEOUS | Status: AC
  Administered 2021-07-22: 5 [IU] via SUBCUTANEOUS
  Filled 2021-07-22: qty 0.05

## 2021-07-22 MED ORDER — INSULIN GLARGINE-YFGN 100 UNIT/ML ~~LOC~~ SOLN
30.0000 [IU] | Freq: Every day | SUBCUTANEOUS | Status: DC
  Filled 2021-07-22: qty 0.3

## 2021-07-22 MED ORDER — LEVOFLOXACIN IN D5W 500 MG/100ML IV SOLN
500.0000 mg | INTRAVENOUS | Status: DC
  Filled 2021-07-22: qty 100

## 2021-07-22 MED ORDER — DEXMEDETOMIDINE HCL IN NACL 400 MCG/100ML IV SOLN
0.1000 ug/kg/h | INTRAVENOUS | Status: DC
  Filled 2021-07-22: qty 100

## 2021-07-22 MED ORDER — TORSEMIDE 20 MG PO TABS
40.0000 mg | ORAL_TABLET | Freq: Every day | ORAL | Status: DC
  Administered 2021-07-22 – 2021-07-23 (×2): 40 mg via ORAL
  Filled 2021-07-22 (×2): qty 2

## 2021-07-22 MED ORDER — TRANEXAMIC ACID 1000 MG/10ML IV SOLN
1.5000 mg/kg/h | INTRAVENOUS | Status: DC
  Filled 2021-07-22: qty 25

## 2021-07-22 MED ORDER — TRANEXAMIC ACID (OHS) BOLUS VIA INFUSION
15.0000 mg/kg | INTRAVENOUS | Status: DC
  Filled 2021-07-22: qty 1037

## 2021-07-22 MED ORDER — INSULIN REGULAR(HUMAN) IN NACL 100-0.9 UT/100ML-% IV SOLN
INTRAVENOUS | Status: DC
  Filled 2021-07-22: qty 100

## 2021-07-22 MED ORDER — TRANEXAMIC ACID (OHS) PUMP PRIME SOLUTION
2.0000 mg/kg | INTRAVENOUS | Status: DC
  Filled 2021-07-22: qty 1.61

## 2021-07-22 MED ORDER — MILRINONE LACTATE IN DEXTROSE 20-5 MG/100ML-% IV SOLN
0.3000 ug/kg/min | INTRAVENOUS | Status: DC
  Filled 2021-07-22: qty 100

## 2021-07-22 MED ORDER — HEPARIN 30,000 UNITS/1000 ML (OHS) CELLSAVER SOLUTION
Status: DC
  Filled 2021-07-22: qty 1000

## 2021-07-22 MED ORDER — MANNITOL 20 % IV SOLN
INTRAVENOUS | Status: DC
  Filled 2021-07-22: qty 13

## 2021-07-22 MED ORDER — POTASSIUM CHLORIDE 2 MEQ/ML IV SOLN
80.0000 meq | INTRAVENOUS | Status: DC
  Filled 2021-07-22: qty 40

## 2021-07-22 MED ORDER — EPINEPHRINE HCL 5 MG/250ML IV SOLN IN NS
0.0000 ug/min | INTRAVENOUS | Status: DC
Start: 2021-07-25 — End: 2021-07-24
  Filled 2021-07-22: qty 250

## 2021-07-22 MED ORDER — VANCOMYCIN HCL 10 G IV SOLR
1250.0000 mg | INTRAVENOUS | Status: DC
  Filled 2021-07-22: qty 12.5

## 2021-07-22 MED ORDER — NITROGLYCERIN IN D5W 200-5 MCG/ML-% IV SOLN
2.0000 ug/min | INTRAVENOUS | Status: DC
  Filled 2021-07-22: qty 250

## 2021-07-22 MED ORDER — INSULIN ASPART 100 UNIT/ML IJ SOLN
6.0000 [IU] | Freq: Three times a day (TID) | INTRAMUSCULAR | Status: DC
  Administered 2021-07-22 – 2021-07-23 (×3): 6 [IU] via SUBCUTANEOUS

## 2021-07-22 MED FILL — Lidocaine HCl Local Preservative Free (PF) Inj 1%: INTRAMUSCULAR | Qty: 30 | Status: AC

## 2021-07-22 NOTE — Progress Notes (Addendum)
Patient ID: Emily Larson, female   DOB: 1975-12-19, 45 y.o.   MRN: [id number]     Advanced Heart Failure Rounding Note  PCP-Cardiologist: None   Subjective:    RHC 11/30 demonstrated severe PAH w/ reduced CO and essentially normal left sided pressures.   RHC Findings  RA = 5 RV = 108/3 PA = 101/52 (71) PCW = 18 (no significant v waves) Fick cardiac output/index = 2.9/1.5 PVR = 18.6 WU PAPi = 10  Ao sat = 95% PA sat = 60%, 61%  Moved to ICU and started on milrinone and sildenafil.   On Milrinone 0.25. Sildenafil 20 tid. Macitentan added 12/01  Co-ox 83%  Swan #s  CVP 4 PAP 108/35 (62)  CO 6.73 CI 3.53 Co-ox 83% PAPi 18.25  -860 cc yesterday. Weight up 2 lb overnight.  Scr 1.31 > 1.04 > 1.16 > 1.46 > 1.43>1.35>1.11  K 4.1  Na 134 > 133 > 132 > 129>133>131  Tearful today. She is worried about prognosis if PA pressures do not improve enough for surgery.  Dental medicine seeing for pre-op evaluation  Objective:   Weight Range: 80.4 kg Body mass index is 27.76 kg/m.   Vital Signs:   Temp:  [97 F (36.1 C)-97.9 F (36.6 C)] 97 F (36.1 C) (12/02 0800) Pulse Rate:  [66-153] 80 (12/02 0800) Resp:  [9-22] 17 (12/02 0800) BP: (94-123)/(53-91) 109/59 (12/02 0800) SpO2:  [89 %-98 %] 94 % (12/02 0800) Weight:  [80.4 kg] 80.4 kg (12/02 0500) Last BM Date: 07/19/21  Weight change: Filed Weights   07/20/21 0423 07/21/21 0500 07/22/21 0500  Weight: 79.5 kg 79.7 kg 80.4 kg    Intake/Output:   Intake/Output Summary (Last 24 hours) at 07/22/2021 1002 Last data filed at 07/22/2021 0600 Gross per 24 hour  Intake 1639.72 ml  Output 2175 ml  Net -535.28 ml      Physical Exam   CVP 4 General:  Fatigued appearing. Tearful HEENT: normal Neck: supple. no JVD. + swan L IJ Cor: PMI nondisplaced. Regular rate & rhythm. No rubs, gallops, + 3/6 HSM best heard at apex Lungs: clear Abdomen: soft, nontender, nondistended. No hepatosplenomegaly. No bruits or masses.  Good bowel sounds. Extremities: no cyanosis, clubbing, rash, edema, + UNNA Neuro: alert & orientedx3, cranial nerves grossly intact. moves all 4 extremities w/o difficulty. Affect pleasant    Telemetry   SR 70s-80s (personally reviewed)  Labs    CBC Recent Labs    07/20/21 1308 07/20/21 1309  HGB 19.7* 19.7*  HCT 58.0* 58.0*   Basic Metabolic Panel Recent Labs    08/65/78 0357 07/22/21 0500  NA 133* 131*  K 4.0 4.1  CL 97* 100  CO2 26 24  GLUCOSE 219* 323*  BUN 54* 49*  CREATININE 1.35* 1.11*  CALCIUM 8.9 9.5  MG  --  2.4   Liver Function Tests No results for input(s): AST, ALT, ALKPHOS, BILITOT, PROT, ALBUMIN in the last 72 hours.  No results for input(s): LIPASE, AMYLASE in the last 72 hours. Cardiac Enzymes No results for input(s): CKTOTAL, CKMB, CKMBINDEX, TROPONINI in the last 72 hours.  BNP: BNP (last 3 results) Recent Labs    05/27/21 1044 07/12/21 1018 07/15/21 1429  BNP 928.3* 667.6* 671.5*    ProBNP (last 3 results) Recent Labs    04/22/21 1147  PROBNP 3,347*     D-Dimer No results for input(s): DDIMER in the last 72 hours. Hemoglobin A1C No results for input(s): HGBA1C in the last 72  hours. Fasting Lipid Panel No results for input(s): CHOL, HDL, LDLCALC, TRIG, CHOLHDL, LDLDIRECT in the last 72 hours. Thyroid Function Tests No results for input(s): TSH, T4TOTAL, T3FREE, THYROIDAB in the last 72 hours.  Invalid input(s): FREET3   Other results:   Imaging    DG Orthopantogram  Result Date: 07/21/2021 CLINICAL DATA:  Preoperative evaluation for heart surgery. EXAM: ORTHOPANTOGRAM/PANORAMIC COMPARISON:  None. FINDINGS: No evidence of periodontal disease or root abscess by Panorex. Question dental caries of the anterior maxillary dentition. IMPRESSION: No evidence of detectable periodontal disease or root abscess by Panorex. Probable few caries of the anterior maxillary dentition. Electronically Signed   By: Paulina Fusi M.D.   On:  07/21/2021 15:31     Medications:     Scheduled Medications:  buprenorphine-naloxone  1 tablet Sublingual BID   cariprazine  1.5 mg Oral Daily   Chlorhexidine Gluconate Cloth  6 each Topical Daily   enoxaparin (LOVENOX) injection  40 mg Subcutaneous Q24H   [START ON 07/25/2021] epinephrine  0-10 mcg/min Intravenous To OR   famotidine  40 mg Oral Daily   fluticasone furoate-vilanterol  1 puff Inhalation Daily   [START ON 07/25/2021] heparin-papaverine-plasmalyte irrigation   Irrigation To OR   insulin aspart  0-5 Units Subcutaneous QHS   insulin aspart  0-9 Units Subcutaneous TID WC   insulin aspart  4 Units Subcutaneous TID WC   insulin glargine-yfgn  25 Units Subcutaneous Daily   [START ON 07/25/2021] insulin   Intravenous To OR   [START ON 07/25/2021] [place] Blood Cardioplegia vial (lidocaine/magnesium/mannitol 0.26g-4g-6.4g)   Intracoronary To OR   macitentan  10 mg Oral Daily   nicotine  21 mg Transdermal Daily   [START ON 07/25/2021] phenylephrine  30-200 mcg/min Intravenous To OR   [START ON 07/25/2021] potassium chloride  80 mEq Other To OR   predniSONE  40 mg Oral Q breakfast   pregabalin  150 mg Oral TID   sodium chloride flush  10-40 mL Intracatheter Q12H   sodium chloride flush  3 mL Intravenous Q12H   sodium chloride flush  3 mL Intravenous Q12H   sodium chloride flush  3 mL Intravenous Q12H   spironolactone  12.5 mg Oral Daily   tadalafil  40 mg Oral Daily   traMADol  50 mg Oral Q8H   [START ON 07/25/2021] tranexamic acid  15 mg/kg (Adjusted) Intravenous To OR   [START ON 07/25/2021] tranexamic acid  2 mg/kg Intracatheter To OR   umeclidinium bromide  1 puff Inhalation Daily    Infusions:  sodium chloride 20 mL/hr at 07/20/21 1619   sodium chloride     sodium chloride     sodium chloride 10 mL/hr at 07/22/21 0600   sodium chloride Stopped (07/21/21 2202)   [START ON 07/25/2021] dexmedetomidine     [START ON 07/25/2021] heparin 30,000 units/NS 1000 mL solution for  CELLSAVER     [START ON 07/25/2021] levofloxacin (LEVAQUIN) IV     milrinone 0.25 mcg/kg/min (07/22/21 0600)   [START ON 07/25/2021] milrinone     [START ON 07/25/2021] nitroGLYCERIN     [START ON 07/25/2021] norepinephrine     [START ON 07/25/2021] tranexamic acid (CYKLOKAPRON) infusion ([facility])     [START ON 07/25/2021] vancomycin      PRN Medications: sodium chloride, sodium chloride, sodium chloride, acetaminophen, acetaminophen, albuterol, ondansetron (ZOFRAN) IV, ondansetron (ZOFRAN) IV, sodium chloride flush, sodium chloride flush, sodium chloride flush    Assessment/Plan   1. Acute on Chronic Diastolic HF due to  severe mitral valve disease -TEE 04/29/2021 EF 60-65%, normal RV size and function, Severe MR,  Mod MS likely burnt out vegetation on both leaflets, TR mild-mod - NYHA IIIb-IV with R>L HF - Marked volume overload despite increased home diuretic regimen. - Diuresed 20 lb on lasix gtt  - RHC 11/30 showed severe PAH w/ reduced CO and essentially normal left sided pressures. Moved to ICU and started on milrinone and sildenafil to lower PA pressures for pre-op optimization prior to MVR - Lasix gtt now off w/ low systemic pressures and CVP 4 today - Spiro 12.5 mg daily - UNNA boots    2. Pulmonary HTN - multifactorial, based on current workup primarily groups 1, 2 and 3  - RHC c/w PAH  - Remains on milrinone and tadalafil. Started macitentan yesterday.  - PA pressures still elevated after diuresis - 108/35 (62), PAPi 18.25 PVR 18 - Auto-immune serologies pending ([place]-70, ANA, ANCA, RF, CCP) but no stigmata of CTD. HIV negative. HCV +   3. Mitral valve disease, severe -TEE 04/29/2021 EF 60-65%, normal RV size and function, Mod MS/Severe MR, likely burnt out vegetation on both leaflets, TR mild-mod - As above with good diuresis w/ IV Lasix. Now off lasix. Volume status low - c/w milrinone + sildenafil + macitentan to lower PA pressures for pre-op optimization prior to MVR - Seen by  Dr. Laneta Simmers yesterday. Tentatively scheduled for MVR Monday in the event we can decrease PA pressures. Dentistry evaluating.    4. COPD with ongoing tobacco use - Following with Dr. Blenda Nicely - PFTs 06/1821: FEV 1.56 (47%) FVC 2.20 (54%) Ratio (71%) DLCO 68% - Was actively smoking prior to admit   5. Opioid Use Disorder -sees Dr. Nedra Hai in [place] who prescribes suboxone -has not used IV opiates or cocaine in 2 years per her report    6. HCV -needs ID follow up eventually  7. Diabetes - CBGs elevated 200-300s - Increasing long-acting insulin this am.discussed with PharmD. - A1c 10.2  8. LE pain - Likely dt/ gout - Uric acid elevated - Treating with prednisone burst   Length of Stay: 7  FINCH, LINDSAY N, PA-C  07/22/2021, 10:02 AM  Advanced Heart Failure Team Pager [phone] (M-F; 7a - 5p)  Please contact [facility] for night-coverage after hours (5p -7a ) and weekends on [email or web address]    Agree with above.   Feels ok. Breathing some better. Gout pain improved with steroids.   PA pressures dropped initially with addition of milrinone & PDE-5i 110 -> 80s but back up this am. Ernestine Conrad pulled back this am and had to be removed. MV sat was 83%   General:  Sitting in chair. No resp difficulty HEENT: normal Neck: supple. LIJ swan  Carotids 2+ bilat; no bruits. No lymphadenopathy or thryomegaly appreciated. Cor: PMI nondisplaced. Regular rate & rhythm. 2/6 TR/MR Lungs: clear Abdomen: obese soft, nontender, nondistended. No hepatosplenomegaly. No bruits or masses. Good bowel sounds. Extremities: no cyanosis, clubbing, rash, trace-1+ edema + UNNA Neuro: alert & orientedx3, cranial nerves grossly intact. moves all 4 extremities w/o difficulty. Affect pleasant  She had initial response to milrinone and PDE-5i but now PA pressures back up. Concern for component of fixed PAH which would preclude MVR.  Have d/w [facility] and will transfer for further w/u and treatment of PAH and possible MVR. Will  restart oral diuretics to help keep left-side pressures down. (PCWP was 18 after initial IV diuresis)   I d/w patient at length and she agrees.  CRITICAL CARE Performed by: Arvilla Meres  Total critical care time: 55 minutes  Critical care time was exclusive of separately billable procedures and treating other patients.  Critical care was necessary to treat or prevent imminent or life-threatening deterioration.  Critical care was time spent personally by me (independent of midlevel providers or residents) on the following activities: development of treatment plan with patient and/or surrogate as well as nursing, discussions with consultants, evaluation of patient's response to treatment, examination of patient, obtaining history from patient or surrogate, ordering and performing treatments and interventions, ordering and review of laboratory studies, ordering and review of radiographic studies, pulse oximetry and re-evaluation of patient's condition.  Arvilla Meres, MD  7:16 PM

## 2021-07-22 NOTE — TOC Initial Note (Signed)
Transition of Care [facility]) - Initial/Assessment Note    Patient Details  Name: Emily Larson MRN: [id number] Date of Birth: 1976-01-11  Transition of Care [facility]) CM/SW Contact:    Elliot Cousin, RN Phone Number: [phone] 07/22/2021, 2:16 PM  Clinical Narrative:                 HF TOC CM spoke to pt at bedside. Pt states she lives with dtr, Martie Lee. States she was independent prior to hospital stay. Dtr or insurance provided transportation to appts. Offered choice for [facility]. Pt agreeable to agency that accepted insurance. Contacted [facility] rep, Pearson Grippe with new referral. Will review referral and confirm with CM. Pt scheduled for surgery on 12/5.  Expected Discharge Plan: Home w Home Health Services Barriers to Discharge: Continued Medical Work up   Patient Goals and CMS Choice Patient states their goals for this hospitalization and ongoing recovery are:: would like to get back to feeling better   Choice offered to / list presented to : Patient  Expected Discharge Plan and Services Expected Discharge Plan: Home w Home Health Services In-house Referral: Clinical Social Work Discharge Planning Services: CM Consult Post Acute Care Choice: Home Health Living arrangements for the past 2 months: Single Family Home                                      Prior Living Arrangements/Services Living arrangements for the past 2 months: Single Family Home Lives with:: Adult Children Patient language and need for interpreter reviewed:: Yes Do you feel safe going back to the place where you live?: Yes      Need for Family Participation in Patient Care: Yes (Comment) Care giver support system in place?: Yes (comment)   Criminal Activity/Legal Involvement Pertinent to Current Situation/Hospitalization: No - Comment as needed  Activities of Daily Living      Permission Sought/Granted Permission sought to share information with : Case Manager, Family Supports,  PCP Permission granted to share information with : Yes, Verbal Permission Granted  Share Information with NAME: Sharin Grave  Permission granted to share info w AGENCY: Home Health, DME  Permission granted to share info w Relationship: daugther  Permission granted to share info w Contact Information: [phone]  Emotional Assessment Appearance:: Appears stated age Attitude/Demeanor/Rapport: Gracious, Engaged Affect (typically observed): Accepting Orientation: : Oriented to Self, Oriented to Place, Oriented to  Time, Oriented to Situation Alcohol / Substance Use: Tobacco Use Psych Involvement: No (comment)  Admission diagnosis:  Acute on chronic diastolic heart failure (HCC) [I50.33] Patient Active Problem List   Diagnosis Date Noted   Encounter for preoperative dental examination    Teeth missing    Caries    Accretions on teeth    Chronic periodontitis    Excessive dental attrition    Impacted third molar tooth    Secondary dental caries associated with failed or defective dental restoration    Acute on chronic diastolic heart failure (HCC) 07/15/2021   Congestive heart failure (HCC) 03/31/2021   Heart failure due to valvular disease (HCC) 03/31/2021   Severe mitral regurgitation 03/31/2021   Moderate mitral stenosis 03/31/2021   Cigarette smoker 03/31/2021   Abdominal ascites 03/30/2021   Acute kidney injury (HCC) 03/30/2021   Anasarca 03/30/2021   Bacteremia due to methicillin susceptible Staphylococcus aureus (MSSA) 03/30/2021   CVA (cerebral vascular accident) (HCC) 03/30/2021   Diabetic foot  ulcer (HCC) 03/30/2021   Dysuria 03/30/2021   Endocarditis due to methicillin susceptible Staphylococcus aureus (MSSA) 03/30/2021   Heart failure, acute diastolic (HCC) 03/30/2021   Heart failure, unspecified (HCC) 03/30/2021   Hyponatremia 03/30/2021   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   Lung mass 03/30/2021   Nauseated 03/30/2021   Pneumonia 03/30/2021   Tinea cruris  03/30/2021   History of endocarditis 03/30/2021   Allergy 03/30/2021   Arthropathy 03/30/2021   Bipolar 1 disorder (HCC) 03/30/2021   Chronic airway obstruction (HCC) 03/30/2021   Depression 03/30/2021   Diabetes mellitus without complication (HCC) 03/30/2021   Esophageal reflux 03/30/2021   Endocarditis 03/30/2021   Hepatitis 03/30/2021   Hyperlipidemia 03/30/2021   Hypertension 03/30/2021   Opioid abuse (HCC) 03/30/2021   Polyneuropathy 03/30/2021   History of intravenous drug abuse (HCC) 01/01/2020   Fluid retention 12/11/2019   Old cerebrovascular accident (CVA) without late effect 11/05/2018   Chronic viral hepatitis C (HCC) 05/05/2016   Mitral regurgitation 05/05/2016   Drug overdose 04/23/2016   Asthma exacerbation 04/23/2016   Acute respiratory failure with hypoxia and hypercapnia (HCC) 04/23/2016   IVDU (intravenous drug user) 04/23/2016   Transaminitis 04/23/2016   Alcohol intoxication (HCC) 04/23/2016   Overdose 04/23/2016   Cyst of left ovary 01/20/2016   Hypomenorrhea/oligomenorrhea 01/20/2016   Tobacco abuse 06/30/2014   Confusion 06/28/2014   Unspecified visual loss 06/28/2014   Major depressive disorder, recurrent, in remission, unspecified (HCC) 06/12/2014   Endocarditis of mitral valve 06/08/2014   Osteomyelitis of toe of left foot (HCC) 06/08/2014   Chronic obstructive pulmonary disease, unspecified (HCC) 05/29/2014   Essential hypertension 05/29/2014   History of stroke 05/29/2014   Cervical radicular pain 12/27/2012   Chronic pain syndrome 12/27/2012   Low back pain, unspecified 12/27/2012   Neck pain 12/27/2012   PCP:  Adela Glimpse, NP Pharmacy:   [facility] [address] [place] [address] Phone: [phone] Fax: [phone]     Social Determinants of Health (SDOH) Interventions    Readmission Risk Interventions No flowsheet data found.

## 2021-07-22 NOTE — Discharge Summary (Addendum)
Advanced Heart Failure Team  Discharge Summary   Patient ID: Emily Larson MRN: [id number], DOB/AGE: 1975-11-12 45 y.o. Admit date: 07/15/2021 D/C date:     07/23/21   Primary Discharge Diagnoses:  Acute on chronic diastolic CHF Pulmonary arterial hypertension Mitral valve disease COPD Hx tobacco use Opioid use disorder HCV  Diabetes Acute gout flare   Hospital Course:  Emily Larson is a 45 yo female with h/o obesity, COPD with ongoing tobacco use, DM2, polysubstance abuse including cocaine and IV,opioid use, HCV, chronic diastolic HF, mitral valve endocarditis with severe MR/moderate Emily and severe pulmonary HTN, and mitral valve disease.     She was hospitalized for nearly 2 months at [facility]  in 2015 for acute mitral valve endocarditis 2/2 IVDU.  Since that time she has multiple hospitalizations for diastolic CHF. Recently readmitted to [facility] with recurrent HF. ECHO done at [facility] normal EF with severe MR, moderate Emily and severe PH.  Diuresed and referred to Dr. Excell Seltzer who arranged for [facility] and TEE. Results below   [facility] 04/29/21: normal coronaries LVEDP = 17 RA  = 23 RV  = 105/10 PA   = 100/40 (59)  PW  = 28  (v 35)  CO 3.25, CI 1.6 PVR = 9.5 WU PAPi = 2.6  Mitral mean gradient 16, peak gradient 10, MVAI 0.32    TEE 04/29/2021: EF 60-65%, normal RV size and function, Mod-Severe MR, likely burnt out vegetation on both leaflets, mild to mod Emily, TR mild-mod  Established with Dr. Gala Romney 05/2021. Plan was for surgical MVR once volume optimized. She failed outpatient diuresis and was directly admitted on 07/15/2021. Admitted with marked volume overload. Started on lasix drip + metolazone. Had PICC placed to guide diuresis and follow mixed venous saturation. Diuresed 20 pounds. Had worsening renal function and was taken for RHC. RHC 11/30 demonstrated severe PAH with CO 2.9, CI 1.5, and PVR 18.6. Started on milrinone + sildenafil with swan left to follow hemodynamics.  The following day PA pressures remained elevated so macitentan was added. (Of note she is enrolled in the REM[facility] program for macitentan).   CT consulted  for MVR but recommended further optimization given persistently high PA pressures. Concern she may need IV flolan. Emily Larson was removed today. Dr Gala Romney discussed with [facility] and she was accepted for Dr Ramiro Harvest for further  management.    Swan #s today on Milrinone 0.25 mcg CVP 4 PAP 108/35 (62)  CO 6.73 CI 3.53 Co-ox 83% PAPi 18.25  RHC 07/20/2021  RA = 5 RV = 108/3 PA = 101/52 (71) PCW = 18 (no significant v waves) Fick cardiac output/index = 2.9/1.5 PVR = 18.6 WU PAPi = 10  Ao sat = 95% PA sat = 60%, 61%   Hospital Course by Problem: 1. Acute on Chronic Diastolic HF due to severe mitral valve disease -TEE 04/29/2021 EF 60-65%, normal RV size and function, Severe MR,  Mod Emily likely burnt out vegetation on both leaflets, TR mild-mod - NYHA IIIb-IV with R>L HF - Marked volume overload despite increased home diuretic regimen.Diuresed 20 lb on lasix gtt and later stopped after cath.  - RHC 11/30 showed severe PAH w/ reduced CO and essentially normal left sided pressures.  2. Pulmonary HTN - multifactorial, based on current workup primarily groups 1, 2 and 3  - RHC c/w PAH  - Remains on milrinone and tadalafil. Started macitentan yesterday.  - PA pressures still elevated after diuresis - 108/35 (62), PAPi 18.25 PVR 18 -  Auto-immune serologies pending ([place]-70, ANA, ANCA, RF, CCP) but no stigmata of CTD. HIV negative. HCV + -  May need inhaled NO +/- IV epoprostenol --> transferring to [facility]  3. Mitral valve disease, severe -TEE 04/29/2021 EF 60-65%, normal RV size and function, Mod Emily/Severe MR, likely burnt out vegetation on both leaflets, TR mild-mod -CT consulted with recommendations for further optimization.    4. COPD with ongoing tobacco use - Discussed smoking cessation   5. Opioid Use Disorder -sees Dr. Nedra Hai in [place] who  prescribes suboxone -has not used IV opiates or cocaine in 2 years per her report   6. HCV -needs ID follow up eventually  7. Diabetes - CBGs elevated 200-300s - Increasing long-acting insulin this am.discussed with PharmD. - A1c 10.2  8. LE pain - Likely dt/ gout - Uric acid elevated - Treating with prednisone burst   Discharge Vitals: Blood pressure 102/60, pulse 65, temperature (!) 97.3 F (36.3 C), resp. rate 13, weight 80.4 kg, SpO2 95 %.  Labs: Lab Results  Component Value Date   WBC 11.2 (H) 07/19/2021   HGB 19.7 (H) 07/20/2021   HCT 58.0 (H) 07/20/2021   MCV 87.1 07/19/2021   PLT 148 (L) 07/19/2021    Recent Labs  Lab 07/22/21 0500  NA 131*  K 4.1  CL 100  CO2 24  BUN 49*  CREATININE 1.11*  CALCIUM 9.5  GLUCOSE 323*   No results found for: CHOL, HDL, LDLCALC, TRIG BNP (last 3 results) Recent Labs    05/27/21 1044 07/12/21 1018 07/15/21 1429  BNP 928.3* 667.6* 671.5*    ProBNP (last 3 results) Recent Labs    04/22/21 1147  PROBNP 3,347*     Diagnostic Studies/Procedures   DG Orthopantogram  Result Date: 07/21/2021 CLINICAL DATA:  Preoperative evaluation for heart surgery. EXAM: ORTHOPANTOGRAM/PANORAMIC COMPARISON:  None. FINDINGS: No evidence of periodontal disease or root abscess by Panorex. Question dental caries of the anterior maxillary dentition. IMPRESSION: No evidence of detectable periodontal disease or root abscess by Panorex. Probable few caries of the anterior maxillary dentition. Electronically Signed   By: Paulina Fusi M.D.   On: 07/21/2021 15:31   DG CHEST PORT 1 VIEW  Addendum Date: 07/22/2021   ADDENDUM REPORT: 07/22/2021 12:07 ADDENDUM: At 1157 hours Dr. Milas Kocher advised that he was aware of these findings and had already removed the Swan-Ganz catheter. Electronically Signed   By: Odessa Fleming M.D.   On: 07/22/2021 12:07   Result Date: 07/22/2021 CLINICAL DATA:  45 year old female with acute on chronic diastolic heart failure. EXAM:  PORTABLE CHEST 1 VIEW COMPARISON:  Portable chest 07/20/2021 and earlier. FINDINGS: Portable AP semi upright view at 1129 hours. Left IJ approach Swan-Ganz catheter loops through the right atrium and doubles back into the SVC, tip projecting at the right innominate vein. Its course now obscures the right side PICC line. Mediastinal contours remain normal. Visualized tracheal air column is within normal limits. Stable lung volumes. Pulmonary vascularity appears mildly decreased but not normalized. Trace fluid in the right minor fissure persists. No pneumothorax or consolidation. No acute osseous abnormality identified. Negative visible bowel gas. IMPRESSION: 1. Swan-Ganz catheter now malpositioned, loops through the right atrium and doubles back into the SVC with tip at the right innominate vein. 2. Regressed but not resolved pulmonary edema. Electronically Signed: By: Odessa Fleming M.D. On: 07/22/2021 11:43   Port CXR  Result Date: 07/20/2021 CLINICAL DATA:  Central line placement EXAM: PORTABLE CHEST 1 VIEW COMPARISON:  07/16/2021  FINDINGS: Left IJ central line tip overlies the main pulmonary artery. Right PICC line tip overlies SVC. No pneumothorax. Slightly improved lung aeration with probable mild interstitial edema. Trace right pleural effusion. IMPRESSION: Lines as above.  No pneumothorax. Slightly improved lung aeration with probable mild interstitial edema. Trace right pleural effusion. Electronically Signed   By: Guadlupe Spanish M.D.   On: 07/20/2021 15:50    Discharge Medications   Disposition   The patient will be discharged to [facility]      Duration of Discharge Encounter: Greater than 35 minutes   Signed, FINCH, LINDSAY N  07/22/2021, 3:24 PM

## 2021-07-22 NOTE — Progress Notes (Signed)
1740 - obtained CBG after patient ate late lunch and full dinner within 2 hr. Period. CBG read 570-sending serum glucose for verfication. Notified PA- Brion Aliment. Instructed to proceed with  standing orders of Sliding Scale Novolog 9U along with Novolog 6U meal coverage SQ for total of one time 15 U.

## 2021-07-22 NOTE — Progress Notes (Signed)
Contacted [facility]   No bed available for transfer today.   Will likely transfer to [facility] on 12/03 and 12/04.

## 2021-07-22 NOTE — Progress Notes (Signed)
Inpatient Diabetes Program Recommendations  AACE/ADA: New Consensus Statement on Inpatient Glycemic Control (2015)  Target Ranges:  Prepandial:   less than 140 mg/dL      Peak postprandial:   less than 180 mg/dL (1-2 hours)      Critically ill patients:  140 - 180 mg/dL   Lab Results  Component Value Date   GLUCAP 308 (H) 07/22/2021   HGBA1C 10.2 (H) 07/17/2021    Review of Glycemic Control  Latest Reference Range & Units 07/20/21 15:46 07/20/21 22:22 07/21/21 06:09 07/21/21 11:17 07/21/21 16:35 07/21/21 21:53 07/22/21 06:59  Glucose-Capillary 70 - 99 mg/dL 767 (H) 341 (H) 937 (H) 262 (H) 271 (H) 396 (H) 308 (H)  Diabetes history: DM 2    Home DM Meds: Semaglutide 3 mg Daily     Current Orders: Semglee 20 units Daily                            Novolog Sensitive Correction Scale/ SSI (0-9 units) TID AC + HS                            Novolog 4 units TID with meals   Inpatient Diabetes Program Recommendations:    Please consider increasing Semglee to 30 units daily and increase Novolog meal coverage too 6 units tid with meals.    Thanks,  Beryl Meager, RN, BC-ADM Inpatient Diabetes Coordinator Pager [phone]  (8a-5p)

## 2021-07-22 NOTE — Consult Note (Signed)
Department of Dental Medicine          INPATIENT CONSULT   Service Date:   07/22/2021 Admit Date:   07/15/2021  Patient Name:   Emily Larson Date of Birth:   Oct 17, 1975 Medical Record Number: [id number]  Referring Provider:           Evelene Croon, M.D.   PLAN/RECOMMENDATIONS   Assessment There are no current signs of acute odontogenic infection including abscess, edema or erythema, or suspicious lesion requiring biopsy.   Multiple carious teeth & at least 4 teeth with deep decay, bone loss/periodontal concerns.  Recommendations No dental intervention indicated prior to cardiac surgery at this time, however there are dental concerns that do need to be addressed in the future as these will continue to worsen with time. Establish dental care at an outside office of the patient's choice for routine care including cleanings/periodontal therapy and periodic exams following recovery and medical optimization.  Plan Discuss case with medical team. Did offer to see patient for routine dental care after she has recovered from her heart surgery since she does not currently have a dentist and we accept her dental insurance.    Discussed in detail all treatment options and recommendations with the patient and they are agreeable to the plan.    Thank you for consulting with [facility] and for the opportunity to participate in this patient's treatment.  Should you have any questions or concerns, please contact the [facility] at [phone].   07/22/2021 Consult Note:   HISTORY OF PRESENT ILLNESS: Emily Larson is a very pleasant 45 y.o. female with h/o stroke, hypertension, heart failure, asthma, hepatitis, type 2 diabetes mellitus, tobacco use, allergic rhinitis, depression, IV drug use, opioid abuse and mitral regurgitation (is anticipating MVR) who is currently admitted for exacerbation of heart failure and severe mitral regurgitation.  [facility]ospital [facility] was consulted to  complete a medically-necessary dental evaluation as part of the patient's pre-cardiac surgery work-up.   DENTAL HISTORY: The patient reports that she went to a dentist in [place] a few months ago for a tooth extraction.  Before this she states that it had been several years since she had last been.  She says that she has had some fillings fall out in the front teeth not too long ago and she knows she has some cavities.  She currently denies any dental/orofacial pain or sensitivity. Patient is able to manage oral secretions.  Patient denies dysphagia, odynophagia, dysphonia and neck pain.   CHIEF COMPLAINT:  Preoperative dental consult.   Patient Active Problem List   Diagnosis Date Noted  . Acute on chronic diastolic heart failure (HCC) 07/15/2021  . Congestive heart failure (HCC) 03/31/2021  . Heart failure due to valvular disease (HCC) 03/31/2021  . Severe mitral regurgitation 03/31/2021  . Moderate mitral stenosis 03/31/2021  . Cigarette smoker 03/31/2021  . Abdominal ascites 03/30/2021  . Acute kidney injury (HCC) 03/30/2021  . Anasarca 03/30/2021  . Bacteremia due to methicillin susceptible Staphylococcus aureus (MSSA) 03/30/2021  . CVA (cerebral vascular accident) (HCC) 03/30/2021  . Diabetic foot ulcer (HCC) 03/30/2021  . Dysuria 03/30/2021  . Endocarditis due to methicillin susceptible Staphylococcus aureus (MSSA) 03/30/2021  . Heart failure, acute diastolic (HCC) 03/30/2021  . Heart failure, unspecified (HCC) 03/30/2021  . Hyponatremia 03/30/2021  . 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 Lung mass 03/30/2021  . Nauseated 03/30/2021  . Pneumonia 03/30/2021  . Tinea cruris 03/30/2021  . History of endocarditis 03/30/2021  .  Allergy 03/30/2021  . Arthropathy 03/30/2021  . Bipolar 1 disorder (HCC) 03/30/2021  . Chronic airway obstruction (HCC) 03/30/2021  . Depression 03/30/2021  . Diabetes mellitus without complication (HCC) 03/30/2021  . Esophageal reflux 03/30/2021   . Endocarditis 03/30/2021  . Hepatitis 03/30/2021  . Hyperlipidemia 03/30/2021  . Hypertension 03/30/2021  . Opioid abuse (HCC) 03/30/2021  . Polyneuropathy 03/30/2021  . History of intravenous drug abuse (HCC) 01/01/2020  . Fluid retention 12/11/2019  . Old cerebrovascular accident (CVA) without late effect 11/05/2018  . Chronic viral hepatitis C (HCC) 05/05/2016  . Mitral regurgitation 05/05/2016  . Drug overdose 04/23/2016  . Asthma exacerbation 04/23/2016  . Acute respiratory failure with hypoxia and hypercapnia (HCC) 04/23/2016  . IVDU (intravenous drug user) 04/23/2016  . Transaminitis 04/23/2016  . Alcohol intoxication (HCC) 04/23/2016  . Overdose 04/23/2016  . Cyst of left ovary 01/20/2016  . Hypomenorrhea/oligomenorrhea 01/20/2016  . Tobacco abuse 06/30/2014  . Confusion 06/28/2014  . Unspecified visual loss 06/28/2014  . Major depressive disorder, recurrent, in remission, unspecified (HCC) 06/12/2014  . Endocarditis of mitral valve 06/08/2014  . Osteomyelitis of toe of left foot (HCC) 06/08/2014  . Chronic obstructive pulmonary disease, unspecified (HCC) 05/29/2014  . Essential hypertension 05/29/2014  . History of stroke 05/29/2014  . Cervical radicular pain 12/27/2012  . Chronic pain syndrome 12/27/2012  . Low back pain, unspecified 12/27/2012  . Neck pain 12/27/2012   Past Medical History:  Diagnosis Date  . Abdominal ascites 03/30/2021  . Acute kidney injury (HCC) 03/30/2021  . Acute respiratory failure with hypoxia and hypercapnia (HCC) 04/23/2016  . Alcohol intoxication (HCC) 04/23/2016  . Allergy   . Anasarca 03/30/2021  . Arthropathy   . Asthma exacerbation 04/23/2016  . Bacteremia due to methicillin susceptible Staphylococcus aureus (MSSA) 03/30/2021  . Bipolar 1 disorder (HCC)   . Cervical radicular pain 12/27/2012  . Chronic airway obstruction (HCC)   . Chronic obstructive pulmonary disease, unspecified (HCC) 05/29/2014  . Chronic pain syndrome 12/27/2012  .  Chronic viral hepatitis C (HCC) 05/05/2016  . Confusion 06/28/2014  . CVA (cerebral vascular accident) (HCC) 03/30/2021  . Cyst of left ovary 01/20/2016  . Depression   . Diabetes mellitus without complication (HCC)   . Diabetic foot ulcer (HCC) 03/30/2021  . Drug overdose 04/23/2016  . Dysuria 03/30/2021  . Endocarditis    Mitral valve, at [facility] 2015  . Endocarditis due to methicillin susceptible Staphylococcus aureus (MSSA) 03/30/2021  . Endocarditis of mitral valve 06/08/2014  . Esophageal reflux   . Essential hypertension 05/29/2014  . Fluid retention 12/11/2019  . Heart failure, acute diastolic (HCC) 03/30/2021  . Heart failure, unspecified (HCC) 03/30/2021  . Hepatitis   . History of endocarditis 03/30/2021  . History of intravenous drug abuse (HCC) 01/01/2020  . History of stroke 05/29/2014  . Hyperlipidemia   . Hypertension   . Hypomenorrhea/oligomenorrhea 01/20/2016  . Hyponatremia 03/30/2021  . IVDU (intravenous drug user) 04/23/2016  . Low back pain, unspecified 12/27/2012  . 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 Lung mass 03/30/2021  . Major depressive disorder, recurrent, in remission, unspecified (HCC) 06/12/2014  . Mitral regurgitation 05/05/2016  . Nauseated 03/30/2021  . Neck pain 12/27/2012  . Old cerebrovascular accident (CVA) without late effect 11/05/2018  . Opioid abuse (HCC)   . Osteomyelitis of toe of left foot (HCC) 06/08/2014  . Overdose 04/23/2016  . Pneumonia 03/30/2021  . Polyneuropathy   . Tinea cruris 03/30/2021  . Tobacco abuse 06/30/2014  . Transaminitis 04/23/2016  .  Unspecified visual loss 06/28/2014   Past Surgical History:  Procedure Laterality Date  . adnoidectomy    . CESAREAN SECTION    . RIGHT HEART CATH N/A 07/20/2021   Procedure: RIGHT HEART CATH;  Surgeon: Dolores Patty, MD;  [facility]AS[facility] CV LAB;  Service: Cardiovascular;  Laterality: N/A;  . RIGHT/LEFT HEART CATH AND CORONARY ANGIOGRAPHY N/A 04/29/2021   Procedure: RIGHT/LEFT HEART CATH AND CORONARY  ANGIOGRAPHY;  Surgeon: Iran Ouch, MD;  Location: [facility] INVASIVE CV LAB;  Service: Cardiovascular;  Laterality: N/A;  . TEE WITHOUT CARDIOVERSION N/A 04/29/2021   Procedure: TRANSESOPHAGEAL ECHOCARDIOGRAM (TEE);  Surgeon: Wendall Stade, MD;  Location: [facility] ENDOSCOPY;  Service: Cardiovascular;  Laterality: N/A;  CATH AFTER PROCEDURE  . TONSILLECTOMY     Allergies  Allergen Reactions  . Amoxicillin Anaphylaxis  . Keflex [Cephalexin] Itching and Rash  . Penicillins Rash    Has patient had a PCN reaction causing immediate rash, facial/tongue/throat swelling, SOB or lightheadedness with hypotension:Yes Has patient had a PCN reaction causing severe rash involving mucus membranes or skin necrosis:no Has patient had a PCN reaction that required hospitalization:unsure Has patient had a PCN reaction occurring within the last 10 years:No If all of the above answers are "NO", then may proceed with Cephalosporin use.    Current Facility-Administered Medications  Medication Dose Route Frequency Provider Last Rate Last Admin  . 0.9 %  sodium chloride infusion (Manually program via Guardrails IV Fluids)   Intravenous Continuous Bensimhon, Bevelyn Buckles, MD 20 mL/hr at 07/20/21 1619 New Bag at 07/20/21 1619  . 0.9 %  sodium chloride infusion (Manually program via Guardrails IV Fluids)   Intravenous PRN Bensimhon, Bevelyn Buckles, MD      . 0.9 %  sodium chloride infusion  250 mL Intravenous PRN Bensimhon, Bevelyn Buckles, MD      . 0.9 %  sodium chloride infusion  250 mL Intravenous PRN Bensimhon, Bevelyn Buckles, MD      . 0.9 %  sodium chloride infusion   Intravenous Continuous Bensimhon, Bevelyn Buckles, MD 10 mL/hr at 07/22/21 0600 Infusion Verify at 07/22/21 0600  . 0.9 %  sodium chloride infusion   Intravenous Continuous Bensimhon, Bevelyn Buckles, MD   Stopped at 07/21/21 2202  . acetaminophen (TYLENOL) tablet 650 mg  650 mg Oral Q4H PRN Bensimhon, Bevelyn Buckles, MD   650 mg at 07/19/21 0647  . acetaminophen (TYLENOL) tablet 650 mg  650 mg  Oral Q4H PRN Bensimhon, Bevelyn Buckles, MD      . albuterol (PROVENTIL) (2.5 MG/3ML) 0.083% nebulizer solution 2.5 mg  2.5 mg Nebulization Q6H PRN Bensimhon, Bevelyn Buckles, MD   2.5 mg at 07/19/21 0417  . buprenorphine-naloxone (SUBOXONE) 8-2 mg per SL tablet 1 tablet  1 tablet Sublingual BID Bensimhon, Bevelyn Buckles, MD   1 tablet at 07/22/21 0914  . cariprazine (VRAYLAR) capsule 1.5 mg  1.5 mg Oral Daily Bensimhon, Bevelyn Buckles, MD   1.5 mg at 07/22/21 0914  . Chlorhexidine Gluconate Cloth 2 % PADS 6 each  6 each Topical Daily Bensimhon, Bevelyn Buckles, MD   6 each at 07/21/21 2200  . [START ON 07/25/2021] dexmedetomidine (PRECEDEX) 400 MCG/100ML (4 mcg/mL) infusion  0.1-0.7 mcg/kg/hr Intravenous To OR Bartle, Payton Doughty, MD      . enoxaparin (LOVENOX) injection 40 mg  40 mg Subcutaneous Q24H Bensimhon, Bevelyn Buckles, MD   40 mg at 07/22/21 0913  . [START ON 07/25/2021] EPINEPHrine (ADRENALIN) 5 mg in NS 250 mL (0.02 mg/mL) premix  infusion  0-10 mcg/min Intravenous To OR Alleen Borne, MD      . famotidine (PEPCID) tablet 40 mg  40 mg Oral Daily Bensimhon, Bevelyn Buckles, MD   40 mg at 07/22/21 0914  . fluticasone furoate-vilanterol (BREO ELLIPTA) 200-25 MCG/ACT 1 puff  1 puff Inhalation Daily Bensimhon, Bevelyn Buckles, MD   1 puff at 07/22/21 [phone]  . [START ON 07/25/2021] heparin 30,000 units/NS 1000 mL solution for CELLSAVER   Other To OR Alleen Borne, MD      . Melene Muller ON 07/25/2021] heparin sodium (porcine) 5,000 Units, papaverine 60 mg in electrolyte-A (PLASMALYTE-A PH 7.4) 1,000 mL irrigation   Irrigation To OR Alleen Borne, MD      . insulin aspart (novoLOG) injection 0-5 Units  0-5 Units Subcutaneous QHS Bensimhon, Bevelyn Buckles, MD   5 Units at 07/21/21 2222  . insulin aspart (novoLOG) injection 0-9 Units  0-9 Units Subcutaneous TID WC Bensimhon, Bevelyn Buckles, MD   7 Units at 07/22/21 [phone]  . insulin aspart (novoLOG) injection 4 Units  4 Units Subcutaneous TID WC Bensimhon, Bevelyn Buckles, MD   4 Units at 07/22/21 0703  . insulin glargine-yfgn  (SEMGLEE) injection 25 Units  25 Units Subcutaneous Daily Bensimhon, Bevelyn Buckles, MD   25 Units at 07/22/21 0914  . [START ON 07/25/2021] insulin regular, human (MYXREDLIN) 100 units/ 100 mL infusion   Intravenous To OR Alleen Borne, MD      . Melene Muller ON 07/25/2021] [facility] Blood Cardioplegia vial (lidocaine/magnesium/mannitol 0.26g-4g-6.4g)   Intracoronary To OR Alleen Borne, MD      . Melene Muller ON 07/25/2021] levofloxacin (LEVAQUIN) IVPB 500 mg  500 mg Intravenous To OR Alleen Borne, MD      . macitentan (OPSUMIT) tablet 10 mg  10 mg Oral Daily Bensimhon, Bevelyn Buckles, MD   10 mg at 07/22/21 0914  . milrinone (PRIMACOR) 20 MG/100 ML (0.2 mg/mL) infusion  0.25 mcg/kg/min Intravenous Continuous Bensimhon, Bevelyn Buckles, MD 5.96 mL/hr at 07/22/21 0600 0.25 mcg/kg/min at 07/22/21 0600  . [START ON 07/25/2021] milrinone (PRIMACOR) 20 MG/100 ML (0.2 mg/mL) infusion  0.3 mcg/kg/min Intravenous To OR Bartle, Payton Doughty, MD      . nicotine (NICODERM CQ - dosed in mg/24 hours) patch 21 mg  21 mg Transdermal Daily Bensimhon, Bevelyn Buckles, MD   21 mg at 07/22/21 0915  . [START ON 07/25/2021] nitroGLYCERIN 50 mg in dextrose 5 % 250 mL (0.2 mg/mL) infusion  2-200 mcg/min Intravenous To OR Alleen Borne, MD      . Melene Muller ON 07/25/2021] norepinephrine (LEVOPHED)  in (0.016 mg/mL) premix infusion  0-40 mcg/min Intravenous To OR Bartle, Payton Doughty, MD      . ondansetron (ZOFRAN) injection 4 mg  4 mg Intravenous Q6H PRN Bensimhon, Bevelyn Buckles, MD      . ondansetron (ZOFRAN) injection 4 mg  4 mg Intravenous Q6H PRN Bensimhon, Bevelyn Buckles, MD      . Melene Muller ON 07/25/2021] phenylephrine (NEO-SYNEPHRINE) /NS premix infusion  30-200 mcg/min Intravenous To OR Alleen Borne, MD      . Melene Muller ON 07/25/2021] potassium chloride injection 80 mEq  80 mEq Other To OR Alleen Borne, MD      . predniSONE (DELTASONE) tablet 40 mg  40 mg Oral Q breakfast Bensimhon, Bevelyn Buckles, MD   40 mg at 07/22/21 0914  . pregabalin (LYRICA) capsule 150  mg  150 mg Oral TID Bensimhon, Bevelyn Buckles, MD   150  mg at 07/22/21 0925  . sodium chloride flush (NS) 0.9 % injection 10-40 mL  10-40 mL Intracatheter Q12H Bensimhon, Bevelyn Buckles, MD   10 mL at 07/21/21 2200  . sodium chloride flush (NS) 0.9 % injection 10-40 mL  10-40 mL Intracatheter PRN Bensimhon, Bevelyn Buckles, MD      . sodium chloride flush (NS) 0.9 % injection 3 mL  3 mL Intravenous Q12H Bensimhon, Bevelyn Buckles, MD   3 mL at 07/21/21 2200  . sodium chloride flush (NS) 0.9 % injection 3 mL  3 mL Intravenous PRN Bensimhon, Bevelyn Buckles, MD      . sodium chloride flush (NS) 0.9 % injection 3 mL  3 mL Intravenous Q12H Bensimhon, Bevelyn Buckles, MD   3 mL at 07/21/21 2200  . sodium chloride flush (NS) 0.9 % injection 3 mL  3 mL Intravenous Q12H Bensimhon, Bevelyn Buckles, MD   3 mL at 07/21/21 2200  . sodium chloride flush (NS) 0.9 % injection 3 mL  3 mL Intravenous PRN Bensimhon, Bevelyn Buckles, MD      . spironolactone (ALDACTONE) tablet 12.5 mg  12.5 mg Oral Daily Bensimhon, Bevelyn Buckles, MD   12.5 mg at 07/22/21 0914  . tadalafil (CIALIS) tablet 40 mg  40 mg Oral Daily Bensimhon, Bevelyn Buckles, MD   40 mg at 07/22/21 0914  . traMADol (ULTRAM) tablet 50 mg  50 mg Oral Q8H Bensimhon, Bevelyn Buckles, MD   50 mg at 07/22/21 0615  . [START ON 07/25/2021] tranexamic acid (CYKLOKAPRON) 2,500 mg in sodium chloride 0.9 % 250 mL (10 mg/mL) infusion  1.5 mg/kg/hr Intravenous To OR Alleen Borne, MD      . Melene Muller ON 07/25/2021] tranexamic acid (CYKLOKAPRON) bolus via infusion - over 30 minutes 1,036.5 mg  15 mg/kg (Adjusted) Intravenous To OR Alleen Borne, MD      . Melene Muller ON 07/25/2021] tranexamic acid (CYKLOKAPRON) pump prime solution 161 mg  2 mg/kg Intracatheter To OR Alleen Borne, MD      . umeclidinium bromide (INCRUSE ELLIPTA) 62.5 MCG/ACT 1 puff  1 puff Inhalation Daily Bensimhon, Bevelyn Buckles, MD   1 puff at 07/22/21 0747  . [START ON 07/25/2021] vancomycin (VANCOCIN) 1,250 mg in sodium chloride 0.9 % 250 mL IVPB  1,250 mg Intravenous To OR Alleen Borne, MD        LABS: Lab Results  Component Value Date   WBC 11.2 (H) 07/19/2021   HGB 19.7 (H) 07/20/2021   HCT 58.0 (H) 07/20/2021   MCV 87.1 07/19/2021   PLT 148 (L) 07/19/2021      Component Value Date/Time   NA 131 (L) 07/22/2021 0500   NA 141 04/22/2021 1147   K 4.1 07/22/2021 0500   CL 100 07/22/2021 0500   CO2 24 07/22/2021 0500   GLUCOSE 323 (H) 07/22/2021 0500   BUN 49 (H) 07/22/2021 0500   BUN 20 04/22/2021 1147   CREATININE 1.11 (H) 07/22/2021 0500   CALCIUM 9.5 07/22/2021 0500   GFRNONAA >60 07/22/2021 0500   GFRAA >60 04/25/2016 0517   No results found for: INR, PROTIME No results found for: PTT  Social History   Socioeconomic History  . Marital status: Single    Spouse name: Not on file  . Number of children: Not on file  . Years of education: Not on file  . Highest education level: Not on file  Occupational History  . Not on file  Tobacco Use  . Smoking status: Every  Day  . Smokeless tobacco: Never  Vaping Use  . Vaping Use: Every day  Substance and Sexual Activity  . Alcohol use: Yes  . Drug use: Yes    Types: Cocaine, Heroin    Comment: heroin tonight  . Sexual activity: Not on file  Other Topics Concern  . Not on file  Social History Narrative  . Not on file   Social Determinants of Health   Financial Resource Strain: Not on file  Food Insecurity: Not on file  Transportation Needs: Not on file  Physical Activity: Not on file  Stress: Not on file  Social Connections: Not on file  Intimate Partner Violence: Not on file   Family History  Problem Relation Age of Onset  . ADD / ADHD Other   . Alcohol abuse Other   . Stroke Other   . Diabetes Other   . Hypertension Other      REVIEW OF SYSTEMS:  Reviewed with the patient as per HPI. Psych: Patient denies having dental phobia.   VITAL SIGNS: BP (!) 109/59   Pulse 80   Temp (!) 97 F (36.1 C)   Resp 17   Wt 80.4 kg   SpO2 94%   BMI 27.76 kg/m    PHYSICAL  EXAM: General:  Well-developed, comfortable and in no apparent distress. Neurological:  Alert and oriented to person, place and  time. Extraoral:  Facial symmetry present without any edema or erythema.  No swelling or lymphadenopathy.  TMJ asymptomatic without clicks or crepitations. Intraoral:  Soft tissues appear well-perfused and mucous membranes moist.  FOM and vestibules soft and not raised. Oral cavity without mass or lesion. No signs of infection, parulis, sinus tract, edema or erythema evident upon exam.   DENTAL EXAM:  All clinical findings charted.      Overall impression:  Fair remaining dentition.    Oral hygiene:  Poor Periodontal:  Pink, healthy gingival tissue with blunted papilla with areas of inflamed and erythematous tissue.  Generalized plaque and calculus accumulation. Caries:  #2, #3, #5, #8, #9, #10, #12, #15, #20 Defective restorations:  #8, #9 and #12 existing resins with recurrent decay Removable/fixed prosthodontics:  Patient denies wearing partial dentures.  Occlusion:  Unable to assess molar occlusion.   Non-functional teeth: #2, #15, #19 Supra-erupted teeth: #2, #15 Other findings:   (+) Attrition/wear: #23-#26 incisal (+) Impacted: #16   RADIOGRAPHIC EXAM:   07/22/21 Orthopantogram reviewed and interpreted.  Condyles seated bilaterally in fossas.  No evidence of abnormal pathology.  All visualized osseous structures appear WNL.  Radiographic calculus evident.  #2, #5, #10 & #15 have deep decay approximating the pulp.  Caries noted on teeth #3, #8, #9, #12 & #20.  Teeth numbers 2 & 15 supra-erupted; teeth numbers 2 & 3 are mesially drifted.  #16 is impacted (appears to be soft-tissue) & distally erupting.  Existing restorations noted on teeth #'s 3, 8, 9, 12 & 19.   ASSESSMENT:  1.  Severe mitral regurgitation 2.  Preoperative dental exam 3.  Missing teeth 4.  Caries 5.  Accretions on teeth 6.  Defective dental restorations 7.  Chronic periodontitis 8.   Attrition/wear 9.  Impacted tooth 10.  Postoperative bleeding risk   PLAN AND RECOMMENDATIONS: I discussed the risks, benefits, and complications of various scenarios with the patient in relationship to their medical and dental conditions, which included systemic infection such as endocarditis, bacteremia or other serious issues that could potentially occur either before, during or after their  anticipated heart surgery if dental/oral concerns are not addressed.  I explained that if any chronic or acute dental/oral infection(s) are addressed and subsequently not maintained following medical optimization and recovery, their risk of the previously mentioned complications are just as high and could potentially occur postoperatively.  I explained all significant findings of the dental consultation with the patient including multiple teeth with cavities and generalized tartar or calculus build-up causing inflamed gums and bone loss/gum disease, and the recommended care including establishing care at an outside dental office for comprehensive care in order to optimize their oral health following heart surgery and medical optimization.  The patient verbalized understanding of all findings, discussion, and recommendations. We then discussed various treatment options to include no treatment, multiple extractions with alveoloplasty, pre-prosthetic surgery as indicated, periodontal therapy, dental restorations, root canal therapy, crown and bridge therapy, implant therapy, and replacement of missing teeth as indicated.  The patient verbalized understanding of all options.  I also explained that although there is no current acute infection or signs of infection that would be necessary to address prior to surgery, the teeth that do have cavities and her gum disease will continue to worsen if not addressed in the future.  I did offer to see her as a regular patient in our hospital dental clinic once she has recovered and  is cleared by her medical doctors for dental treatment since we do accept her insurance and she does not currently have a dental provider.  She verbalized understanding and is agreeable to this plan. Plan to discuss all findings and recommendations with medical team and coordinate future care as needed.    All questions and concerns were invited and addressed.  The patient tolerated today's visit well.       Thayden Lemire B. Chales Salmon, D.M.D.

## 2021-07-22 NOTE — Progress Notes (Signed)
   Dr Gala Romney contacted [facility] for transfer.   Accepted by Dr Ramiro Harvest.   [facility] contacted. Waiting on confirmation.   Keri Tavella NP-C  3:43 PM

## 2021-07-23 LAB — CBC
HCT: 44.2 % (ref 36.0–46.0)
Hemoglobin: 14.9 g/dL (ref 12.0–15.0)
MCH: 29 pg (ref 26.0–34.0)
MCHC: 33.7 g/dL (ref 30.0–36.0)
MCV: 86.2 fL (ref 80.0–100.0)
Platelets: 95 10*3/uL — ABNORMAL LOW (ref 150–400)
RBC: 5.13 MIL/uL — ABNORMAL HIGH (ref 3.87–5.11)
RDW: 14.7 % (ref 11.5–15.5)
WBC: 10.4 10*3/uL (ref 4.0–10.5)
nRBC: 0 % (ref 0.0–0.2)

## 2021-07-23 LAB — BASIC METABOLIC PANEL
Anion gap: 9 (ref 5–15)
BUN: 49 mg/dL — ABNORMAL HIGH (ref 6–20)
CO2: 25 mmol/L (ref 22–32)
Calcium: 9.2 mg/dL (ref 8.9–10.3)
Chloride: 97 mmol/L — ABNORMAL LOW (ref 98–111)
Creatinine, Ser: 1.19 mg/dL — ABNORMAL HIGH (ref 0.44–1.00)
GFR, Estimated: 58 mL/min — ABNORMAL LOW (ref >60)
Glucose, Bld: 326 mg/dL — ABNORMAL HIGH (ref 70–99)
Potassium: 3.8 mmol/L (ref 3.5–5.1)
Sodium: 131 mmol/L — ABNORMAL LOW (ref 135–145)

## 2021-07-23 LAB — COOXEMETRY PANEL
Carboxyhemoglobin: 1.4 % (ref 0.5–1.5)
Methemoglobin: 0.7 % (ref 0.0–1.5)
O2 Saturation: 70.4 %
Total hemoglobin: 15.2 g/dL (ref 12.0–16.0)

## 2021-07-23 LAB — GLUCOSE, CAPILLARY
Glucose-Capillary: 368 mg/dL — ABNORMAL HIGH (ref 70–99)
Glucose-Capillary: 307 mg/dL — ABNORMAL HIGH (ref 70–99)
Glucose-Capillary: 471 mg/dL — ABNORMAL HIGH (ref 70–99)

## 2021-07-23 LAB — SARS CORONAVIRUS 2 (TAT 6-24 HRS): SARS Coronavirus 2: NEGATIVE

## 2021-07-23 LAB — ANTI-SCLERODERMA ANTIBODY: Scleroderma (Scl-70) (ENA) Antibody, IgG: <0.2 AI (ref 0.0–0.9)

## 2021-07-23 LAB — RHEUMATOID FACTOR: Rheumatoid fact SerPl-aCnc: 19.9 IU/mL — ABNORMAL HIGH (ref <14.0)

## 2021-07-23 LAB — GLUCOSE, RANDOM: Glucose, Bld: 508 mg/dL (ref 70–99)

## 2021-07-23 MED ORDER — ONDANSETRON HCL 4 MG/2ML IJ SOLN
4.0000 mg | Freq: Four times a day (QID) | INTRAMUSCULAR | 0 refills | Status: AC | PRN
Start: 2021-07-23 — End: ?

## 2021-07-23 MED ORDER — TADALAFIL 20 MG PO TABS
40.0000 mg | ORAL_TABLET | Freq: Every day | ORAL | 0 refills | Status: AC
Start: 2021-07-23 — End: ?

## 2021-07-23 MED ORDER — SORBITOL 70 % SOLN
30.0000 mL | Freq: Once | Status: AC
  Administered 2021-07-23: 30 mL via ORAL
  Filled 2021-07-23: qty 30

## 2021-07-23 MED ORDER — ENOXAPARIN SODIUM 40 MG/0.4ML IJ SOSY: 40.0000 mg | PREFILLED_SYRINGE | INTRAMUSCULAR | Status: AC

## 2021-07-23 MED ORDER — MILRINONE LACTATE IN DEXTROSE 20-5 MG/100ML-% IV SOLN: 0.2500 ug/kg/min | INTRAVENOUS | Status: AC

## 2021-07-23 MED ORDER — TORSEMIDE 40 MG PO TABS: 40.0000 mg | ORAL_TABLET | Freq: Every day | ORAL | Status: AC

## 2021-07-23 MED ORDER — MACITENTAN 10 MG PO TABS
10.0000 mg | ORAL_TABLET | Freq: Every day | ORAL | Status: AC
Start: 2021-07-23 — End: ?

## 2021-07-23 MED ORDER — TRAMADOL HCL 50 MG PO TABS: 50.0000 mg | ORAL_TABLET | Freq: Three times a day (TID) | ORAL | Status: AC

## 2021-07-23 MED ORDER — INSULIN GLARGINE-YFGN 100 UNIT/ML ~~LOC~~ SOLN
40.0000 [IU] | Freq: Every day | SUBCUTANEOUS | Status: DC
  Administered 2021-07-23: 40 [IU] via SUBCUTANEOUS
  Filled 2021-07-23: qty 0.4

## 2021-07-23 MED ORDER — INCRUSE ELLIPTA 62.5 MCG/ACT IN AEPB: 1.0000 | INHALATION_SPRAY | Freq: Every day | RESPIRATORY_TRACT | Status: AC

## 2021-07-23 MED ORDER — INSULIN GLARGINE-YFGN 100 UNIT/ML ~~LOC~~ SOLN: 40.0000 [IU] | Freq: Every day | SUBCUTANEOUS | 11 refills | Status: AC

## 2021-07-23 MED ORDER — FLUTICASONE FUROATE-VILANTEROL 200-25 MCG/ACT IN AEPB: 1.0000 | INHALATION_SPRAY | Freq: Every day | RESPIRATORY_TRACT | Status: AC

## 2021-07-23 MED ORDER — ONDANSETRON HCL 4 MG/2ML IJ SOLN: 4.0000 mg | Freq: Four times a day (QID) | INTRAMUSCULAR | 0 refills | Status: AC | PRN

## 2021-07-23 MED ORDER — INSULIN ASPART 100 UNIT/ML IJ SOLN
20.0000 [IU] | Freq: Once | INTRAMUSCULAR | Status: AC
  Administered 2021-07-23: 20 [IU] via SUBCUTANEOUS

## 2021-07-23 NOTE — Progress Notes (Signed)
[facility] here, bedside report given. Paperwork completed. Pt being transported to [facility] for higher level of care. Pt states she will update family.

## 2021-07-23 NOTE — Progress Notes (Signed)
Patient ID: Emily Larson, female   DOB: 1976-04-24, 45 y.o.   MRN: [id number]     Advanced Heart Failure Rounding Note  PCP-Cardiologist: None   Subjective:    RHC 11/30 demonstrated severe PAH w/ reduced CO and essentially normal left sided pressures.   RHC Findings  RA = 5 RV = 108/3 PA = 101/52 (71) PCW = 18 (no significant v waves) Fick cardiac output/index = 2.9/1.5 PVR = 18.6 WU PAPi = 10  Ao sat = 95% PA sat = 60%, 61%  Moved to ICU and started on milrinone and sildenafil.   On Milrinone 0.25. Tafalafil 40  Macitentan 10 added 12/01  Swan #s on 12/2 (before swan dislodged)  CVP 4 PAP 108/35 (62)  CO 6.73 CI 3.53 Co-ox 83% PAPi 18.25   Torsemide restarted yesterday with good urine output. Breathing feels better No orthopnea or PND.   No weight yet this am. On steroids for acute gout. Sugars up. Basal insulin increased yesterday.Still running high. No BM  Objective:   Weight Range: 80.4 kg Body mass index is 27.76 kg/m.   Vital Signs:   Temp:  [97 F (36.1 C)-97.9 F (36.6 C)] 97.7 F (36.5 C) (12/02 2300) Pulse Rate:  [56-80] 66 (12/03 0326) Resp:  [9-17] 12 (12/03 0326) BP: (72-122)/(46-79) 102/61 (12/03 0326) SpO2:  [93 %-98 %] 98 % (12/03 0326) Weight:  [80.4 kg] 80.4 kg (12/02 0500) Last BM Date: 07/19/21  Weight change: Filed Weights   07/20/21 0423 07/21/21 0500 07/22/21 0500  Weight: 79.5 kg 79.7 kg 80.4 kg    Intake/Output:   Intake/Output Summary (Last 24 hours) at 07/23/2021 0354 Last data filed at 07/23/2021 0300 Gross per 24 hour  Intake 977.24 ml  Output 2950 ml  Net -1972.76 ml       Physical Exam   General: Sitting up in bed  No resp difficulty HEENT: normal Neck: supple. LIJ introducer. CVP 6-7. Carotids 2+ bilat; no bruits. No lymphadenopathy or thryomegaly appreciated. Cor: PMI nondisplaced. Regular rate & rhythm. 2/6 MR/TR Lungs: clear Abdomen: obese soft, nontender, nondistended. No hepatosplenomegaly. No bruits or  masses. Good bowel sounds. Extremities: no cyanosis, clubbing, rash, trace edema Neuro: alert & orientedx3, cranial nerves grossly intact. moves all 4 extremities w/o difficulty. Affect pleasant     Telemetry   SR 60-70s (personally reviewed)  Labs    CBC Recent Labs    07/20/21 1308 07/20/21 1309  HGB 19.7* 19.7*  HCT 58.0* 58.0*    Basic Metabolic Panel Recent Labs    78/29/56 0357 07/22/21 0500 07/22/21 1802  NA 133* 131*  --   K 4.0 4.1  --   CL 97* 100  --   CO2 26 24  --   GLUCOSE 219* 323* 584*  BUN 54* 49*  --   CREATININE 1.35* 1.11*  --   CALCIUM 8.9 9.5  --   MG  --  2.4  --     Liver Function Tests No results for input(s): AST, ALT, ALKPHOS, BILITOT, PROT, ALBUMIN in the last 72 hours.  No results for input(s): LIPASE, AMYLASE in the last 72 hours. Cardiac Enzymes No results for input(s): CKTOTAL, CKMB, CKMBINDEX, TROPONINI in the last 72 hours.  BNP: BNP (last 3 results) Recent Labs    05/27/21 1044 07/12/21 1018 07/15/21 1429  BNP 928.3* 667.6* 671.5*     ProBNP (last 3 results) Recent Labs    04/22/21 1147  PROBNP 3,347*      D-Dimer No  results for input(s): DDIMER in the last 72 hours. Hemoglobin A1C No results for input(s): HGBA1C in the last 72 hours. Fasting Lipid Panel No results for input(s): CHOL, HDL, LDLCALC, TRIG, CHOLHDL, LDLDIRECT in the last 72 hours. Thyroid Function Tests No results for input(s): TSH, T4TOTAL, T3FREE, THYROIDAB in the last 72 hours.  Invalid input(s): FREET3   Other results:   Imaging    DG CHEST PORT 1 VIEW  Addendum Date: 07/22/2021   ADDENDUM REPORT: 07/22/2021 12:07 ADDENDUM: At 1157 hours Dr. Milas Kocher advised that he was aware of these findings and had already removed the Swan-Ganz catheter. Electronically Signed   By: Odessa Fleming M.D.   On: 07/22/2021 12:07   Result Date: 07/22/2021 CLINICAL DATA:  45 year old female with acute on chronic diastolic heart failure. EXAM: PORTABLE  CHEST 1 VIEW COMPARISON:  Portable chest 07/20/2021 and earlier. FINDINGS: Portable AP semi upright view at 1129 hours. Left IJ approach Swan-Ganz catheter loops through the right atrium and doubles back into the SVC, tip projecting at the right innominate vein. Its course now obscures the right side PICC line. Mediastinal contours remain normal. Visualized tracheal air column is within normal limits. Stable lung volumes. Pulmonary vascularity appears mildly decreased but not normalized. Trace fluid in the right minor fissure persists. No pneumothorax or consolidation. No acute osseous abnormality identified. Negative visible bowel gas. IMPRESSION: 1. Swan-Ganz catheter now malpositioned, loops through the right atrium and doubles back into the SVC with tip at the right innominate vein. 2. Regressed but not resolved pulmonary edema. Electronically Signed: By: Odessa Fleming M.D. On: 07/22/2021 11:43     Medications:     Scheduled Medications:  buprenorphine-naloxone  1 tablet Sublingual BID   cariprazine  1.5 mg Oral Daily   Chlorhexidine Gluconate Cloth  6 each Topical Daily   enoxaparin (LOVENOX) injection  40 mg Subcutaneous Q24H   [START ON 07/25/2021] epinephrine  0-10 mcg/min Intravenous To OR   famotidine  40 mg Oral Daily   fluticasone furoate-vilanterol  1 puff Inhalation Daily   [START ON 07/25/2021] heparin-papaverine-plasmalyte irrigation   Irrigation To OR   insulin aspart  0-5 Units Subcutaneous QHS   insulin aspart  0-9 Units Subcutaneous TID WC   insulin aspart  6 Units Subcutaneous TID WC   insulin glargine-yfgn  30 Units Subcutaneous Daily   [START ON 07/25/2021] insulin   Intravenous To OR   [START ON 07/25/2021] [place] Blood Cardioplegia vial (lidocaine/magnesium/mannitol 0.26g-4g-6.4g)   Intracoronary To OR   macitentan  10 mg Oral Daily   nicotine  21 mg Transdermal Daily   [START ON 07/25/2021] phenylephrine  30-200 mcg/min Intravenous To OR   [START ON 07/25/2021] potassium  chloride  80 mEq Other To OR   predniSONE  40 mg Oral Q breakfast   pregabalin  150 mg Oral TID   sodium chloride flush  10-40 mL Intracatheter Q12H   sodium chloride flush  3 mL Intravenous Q12H   sodium chloride flush  3 mL Intravenous Q12H   sodium chloride flush  3 mL Intravenous Q12H   spironolactone  12.5 mg Oral Daily   tadalafil  40 mg Oral Daily   torsemide  40 mg Oral Daily   traMADol  50 mg Oral Q8H   [START ON 07/25/2021] tranexamic acid  15 mg/kg (Adjusted) Intravenous To OR   [START ON 07/25/2021] tranexamic acid  2 mg/kg Intracatheter To OR   umeclidinium bromide  1 puff Inhalation Daily    Infusions:  sodium  chloride 20 mL/hr at 07/20/21 1619   sodium chloride     sodium chloride     sodium chloride 10 mL/hr at 07/23/21 0300   sodium chloride Stopped (07/21/21 2202)   [START ON 07/25/2021] dexmedetomidine     [START ON 07/25/2021] heparin 30,000 units/NS 1000 mL solution for CELLSAVER     [START ON 07/25/2021] levofloxacin (LEVAQUIN) IV     milrinone 0.25 mcg/kg/min (07/23/21 0300)   [START ON 07/25/2021] milrinone     [START ON 07/25/2021] nitroGLYCERIN     [START ON 07/25/2021] norepinephrine     [START ON 07/25/2021] tranexamic acid (CYKLOKAPRON) infusion ([facility])     [START ON 07/25/2021] vancomycin      PRN Medications: sodium chloride, sodium chloride, sodium chloride, acetaminophen, acetaminophen, albuterol, ondansetron (ZOFRAN) IV, ondansetron (ZOFRAN) IV, sodium chloride flush, sodium chloride flush, sodium chloride flush    Assessment/Plan   1. Acute on Chronic Diastolic HF due to severe mitral valve disease -TEE 04/29/2021 EF 60-65%, normal RV size and function, Severe MR,  Mod MS likely burnt out vegetation on both leaflets, TR mild-mod - NYHA IIIb-IV with R>L HF - Marked volume overload despite increased home diuretic regimen. - Diuresed 20 lb on lasix gtt -> lasix gtt stopped 11/30 with CVP 4 - RHC 11/30 showed severe PAH w/ reduced CO and essentially  normal left sided pressures. Moved to ICU and started on milrinone and sildenafil to lower PA pressures for pre-op optimization prior to MVR - Torsemide restarted on 12/2 - Spiro 12.5 mg daily - UNNA boots    2. Pulmonary HTN - multifactorial, based on current workup primarily groups 1, 2 and 3  - RHC c/w PAH  - Remains on milrinone and tadalafil. Macitentan 10 started on 12/1 - She had initial response to milrinone and PDE-5i but now PA pressures back up. Concern for component of fixed PAH which would preclude MVR.  Have d/w [facility] and will transfer for further w/u and treatment of PAH and possible MVR. Have restarted oral diuretics to help keep left-side pressures down. (PCWP was 18 after initial IV diuresis)  - PA pressures still elevated on 12/2 after diuresis - 108/35 (62), PAPi 18.25 PVR 18 -> swan dislodeged. Will need to be replaced - Auto-immune serologies pending (Kendall-70, ANA, ANCA, RF, CCP) but no stigmata of CTD. HIV negative. HCV +   3. Mitral valve disease, severe -TEE 04/29/2021 EF 60-65%, normal RV size and function, Mod MS/Severe MR, likely burnt out vegetation on both leaflets, TR mild-mod - Management as above - Will need MVR if can get PA pressures down   4. COPD with ongoing tobacco use - Following with Dr. Blenda Nicely - PFTs 06/1821: FEV 1.56 (47%) FVC 2.20 (54%) Ratio (71%) DLCO 68% - Was actively smoking prior to admit   5. Opioid Use Disorder -sees Dr. Nedra Hai in [place] who prescribes suboxone -has not used IV opiates or cocaine in 2 years per her report    6. HCV -needs ID follow up eventually  7. Diabetes - CBGs elevated > 300-400 in setting of steroids for gout - Increasing long-acting insulin as needed. Cover with SSI - A1c 10.2  8. LE pain - Likely dt/ gout - Uric acid elevated - Treating with prednisone burst 40mg  x 3 days  9. Constipation - sorbitol    Length of Stay: 8  , MD  07/23/2021, 3:54 AM  Advanced Heart Failure Team Pager  [phone] (M-F; 7a - 5p)  Please contact [facility] for  night-coverage after hours (5p -7a ) and weekends on [email or web address]

## 2021-07-23 NOTE — Progress Notes (Signed)
CBG taken after patient ate dinner, CBG 471. Su Hilt, NP notified.  One time dose 20 units novolog and serum glucose ordered.

## 2021-07-25 ENCOUNTER — Inpatient Hospital Stay: Admit: 2021-07-25 | Payer: Medicare Other | Admitting: Surgery

## 2021-07-25 LAB — CYCLIC CITRUL PEPTIDE ANTIBODY, IGG/IGA: CCP Antibodies IgG/IgA: 9 units (ref 0–19)

## 2021-07-25 SURGERY — REPLACEMENT, MITRAL VALVE
Anesthesia: General | Site: Chest

## 2021-07-27 LAB — ANA W/REFLEX IF POSITIVE: Anti Nuclear Antibody (ANA): NEGATIVE

## 2021-07-28 LAB — ANCA TITERS
Atypical P-ANCA titer: <1:20 {titer}
C-ANCA: <1:20 {titer}
P-ANCA: <1:20 {titer}

## 2021-07-29 LAB — ANA W/REFLEX IF POSITIVE

## 2021-08-08 ENCOUNTER — Telehealth (HOSPITAL_COMMUNITY): Payer: Self-pay | Admitting: Pharmacist

## 2021-08-08 NOTE — Telephone Encounter (Addendum)
Received message from [facility]. They have been trying to reach out to schedule shipment of Opsumit but have been unsuccessful.   Left patient a VM and asked her to call [facility] to schedule shipment [phone]).    Karle Plumber, PharmD, BCPS, BCCP, CPP Heart Failure Clinic Pharmacist [phone]

## 2021-08-11 ENCOUNTER — Encounter (HOSPITAL_COMMUNITY): Payer: Medicare Other | Admitting: Internal Medicine

## 2021-08-30 ENCOUNTER — Telehealth: Payer: Self-pay

## 2021-08-30 ENCOUNTER — Ambulatory Visit: Payer: Medicare Other

## 2021-08-30 DIAGNOSIS — I4891 Unspecified atrial fibrillation: Secondary | ICD-10-CM

## 2021-08-30 DIAGNOSIS — Z5181 Encounter for therapeutic drug level monitoring: Secondary | ICD-10-CM

## 2021-08-30 NOTE — Telephone Encounter (Signed)
Pt missed "New Coumadin" appt at [facility] office today. Called pt to reschedule. Pt apologized and stated she did not have transportation to come to appt today; however, her daughter could drive her to appts tomorrow. Made her aware that [place] only sees Coumadin pt's on Tuesdays; however she could go to other office in [place]. Pt stated her daughter could take her to [facility] office tomorrow. Appt scheduled and pt verbalized understanding.

## 2021-08-31 ENCOUNTER — Other Ambulatory Visit: Payer: Self-pay

## 2021-08-31 ENCOUNTER — Ambulatory Visit (INDEPENDENT_AMBULATORY_CARE_PROVIDER_SITE_OTHER): Payer: Medicare Other

## 2021-08-31 ENCOUNTER — Other Ambulatory Visit (HOSPITAL_COMMUNITY): Payer: Self-pay

## 2021-08-31 DIAGNOSIS — Z5181 Encounter for therapeutic drug level monitoring: Secondary | ICD-10-CM

## 2021-08-31 DIAGNOSIS — Z952 Presence of prosthetic heart valve: Secondary | ICD-10-CM

## 2021-08-31 DIAGNOSIS — I4891 Unspecified atrial fibrillation: Secondary | ICD-10-CM

## 2021-08-31 DIAGNOSIS — Z7901 Long term (current) use of anticoagulants: Secondary | ICD-10-CM | POA: Insufficient documentation

## 2021-08-31 LAB — POCT INR: INR: 2.4 (ref 2.0–3.0)

## 2021-08-31 MED ORDER — WARFARIN SODIUM 5 MG PO TABS: ORAL_TABLET | ORAL | 1 refills | Status: DC

## 2021-08-31 MED ORDER — WARFARIN SODIUM 5 MG PO TABS
ORAL_TABLET | ORAL | 1 refills | Status: DC
  Filled 2021-08-31: qty 60, fill #0

## 2021-08-31 NOTE — Patient Instructions (Signed)
TAKE 1 tablet Daily. INR in 1 week.    A full discussion of the nature of anticoagulants has been carried out.  A benefit risk analysis has been presented to the patient, so that they understand the justification for choosing anticoagulation at this time. The need for frequent and regular monitoring, precise dosage adjustment and compliance is stressed.  Side effects of potential bleeding are discussed.  The patient should avoid any OTC items containing aspirin or ibuprofen, and should avoid great swings in general diet.  Avoid alcohol consumption.  Call if any signs of abnormal bleeding.  [phone]

## 2021-09-06 ENCOUNTER — Other Ambulatory Visit: Payer: Self-pay

## 2021-09-06 ENCOUNTER — Telehealth (HOSPITAL_COMMUNITY): Payer: Self-pay | Admitting: Vascular Surgery

## 2021-09-06 ENCOUNTER — Ambulatory Visit (INDEPENDENT_AMBULATORY_CARE_PROVIDER_SITE_OTHER): Payer: Medicare Other

## 2021-09-06 DIAGNOSIS — Z952 Presence of prosthetic heart valve: Secondary | ICD-10-CM

## 2021-09-06 DIAGNOSIS — I4891 Unspecified atrial fibrillation: Secondary | ICD-10-CM | POA: Diagnosis not present

## 2021-09-06 DIAGNOSIS — Z7901 Long term (current) use of anticoagulants: Secondary | ICD-10-CM

## 2021-09-06 DIAGNOSIS — Z5181 Encounter for therapeutic drug level monitoring: Secondary | ICD-10-CM | POA: Diagnosis not present

## 2021-09-06 LAB — POCT INR: INR: 1.9 — AB (ref 2.0–3.0)

## 2021-09-06 NOTE — Patient Instructions (Signed)
Description   Take 1.5 tablets today and then continue taking 1 tablet daily.  Recheck INR in 1 week.   Mechanical valve, possible 3 month duration. Coumadin Clinic [phone]

## 2021-09-06 NOTE — Telephone Encounter (Signed)
Left pt message to make f/u w/ DB ASAP

## 2021-09-08 ENCOUNTER — Telehealth: Payer: Self-pay

## 2021-09-08 NOTE — Telephone Encounter (Signed)
Called and spoke with Dr. Joseph Art ctpa at [facility] concerning pt's INR. Post op clinic checked INR that was 1.8 and pt was instructed by clinic to start taking 10mg  daily EXCEPT 7.5mg  on Mondays, Wednesdays, and Fridays. Pt has appt with anticoagulation clinic on Tuesday 09/13/21 in [place] to recheck INR and evaluate dose change.

## 2021-09-08 NOTE — Telephone Encounter (Signed)
they drew inr at a post op clinic 1.8 and they gave recommendations. 7.5mg  m/w/f nights and 10mg  all other nights dr. Annett Gula ctpa at [place] will route to abigail baynes and pharmd pool to make aware.

## 2021-09-13 ENCOUNTER — Ambulatory Visit (INDEPENDENT_AMBULATORY_CARE_PROVIDER_SITE_OTHER): Payer: Medicare Other

## 2021-09-13 ENCOUNTER — Other Ambulatory Visit: Payer: Self-pay

## 2021-09-13 DIAGNOSIS — Z5181 Encounter for therapeutic drug level monitoring: Secondary | ICD-10-CM | POA: Diagnosis not present

## 2021-09-13 DIAGNOSIS — Z7901 Long term (current) use of anticoagulants: Secondary | ICD-10-CM

## 2021-09-13 DIAGNOSIS — Z952 Presence of prosthetic heart valve: Secondary | ICD-10-CM

## 2021-09-13 DIAGNOSIS — I4891 Unspecified atrial fibrillation: Secondary | ICD-10-CM

## 2021-09-13 LAB — POCT INR: INR: 3.1 — AB (ref 2.0–3.0)

## 2021-09-13 NOTE — Patient Instructions (Addendum)
Description   Continue taking 10mg  daily EXCEPT 7.5mg  on Mondays, Wednesdays, and Fridays as instructed by post op Clinic per Dr Dr 01-08-1990.  Recheck INR in 1 week.   Mechanical valve, possible 3 month duration. Coumadin Clinic [phone]

## 2021-09-20 ENCOUNTER — Ambulatory Visit (INDEPENDENT_AMBULATORY_CARE_PROVIDER_SITE_OTHER): Payer: Medicare Other

## 2021-09-20 ENCOUNTER — Other Ambulatory Visit: Payer: Self-pay

## 2021-09-20 DIAGNOSIS — Z5181 Encounter for therapeutic drug level monitoring: Secondary | ICD-10-CM | POA: Diagnosis not present

## 2021-09-20 DIAGNOSIS — I4891 Unspecified atrial fibrillation: Secondary | ICD-10-CM

## 2021-09-20 LAB — POCT INR: INR: 4.1 — AB (ref 2.0–3.0)

## 2021-09-20 NOTE — Patient Instructions (Signed)
Description   Only take 5mg  today and then START taking 7.5mg  daily EXCEPT 10mg  on Sundays. Recheck INR in 1 week.   Mechanical valve, possible 3 month duration. Coumadin Clinic [phone]

## 2021-09-27 ENCOUNTER — Ambulatory Visit (INDEPENDENT_AMBULATORY_CARE_PROVIDER_SITE_OTHER): Payer: Medicare Other

## 2021-09-27 ENCOUNTER — Other Ambulatory Visit: Payer: Self-pay

## 2021-09-27 DIAGNOSIS — Z5181 Encounter for therapeutic drug level monitoring: Secondary | ICD-10-CM

## 2021-09-27 DIAGNOSIS — I4891 Unspecified atrial fibrillation: Secondary | ICD-10-CM

## 2021-09-27 LAB — POCT INR: INR: 5.7 — AB (ref 2.0–3.0)

## 2021-09-27 NOTE — Patient Instructions (Signed)
Description   Hold today's dose and tomorrow's dose and then START taking 7.5mg  daily EXCEPT 5mg  on Mondays, Wednesdays, and Fridays.  Recheck INR in 1 week.   Mechanical valve, possible 3 month duration. Coumadin Clinic [phone]

## 2021-10-04 ENCOUNTER — Ambulatory Visit (INDEPENDENT_AMBULATORY_CARE_PROVIDER_SITE_OTHER): Payer: Medicare Other

## 2021-10-04 ENCOUNTER — Other Ambulatory Visit: Payer: Self-pay

## 2021-10-04 DIAGNOSIS — Z5181 Encounter for therapeutic drug level monitoring: Secondary | ICD-10-CM

## 2021-10-04 DIAGNOSIS — I4891 Unspecified atrial fibrillation: Secondary | ICD-10-CM

## 2021-10-04 LAB — POCT INR: INR: 2 (ref 2.0–3.0)

## 2021-10-04 NOTE — Patient Instructions (Signed)
Description   Take 2 tablets today and then START taking 7.5mg  daily EXCEPT 5mg  on Mondays and Fridays.  Recheck INR in 1 week.   Mechanical valve, possible 3 month duration. Coumadin Clinic [phone]

## 2021-10-11 ENCOUNTER — Ambulatory Visit (INDEPENDENT_AMBULATORY_CARE_PROVIDER_SITE_OTHER): Payer: Medicare Other

## 2021-10-11 ENCOUNTER — Other Ambulatory Visit: Payer: Self-pay

## 2021-10-11 DIAGNOSIS — Z5181 Encounter for therapeutic drug level monitoring: Secondary | ICD-10-CM

## 2021-10-11 DIAGNOSIS — I4891 Unspecified atrial fibrillation: Secondary | ICD-10-CM

## 2021-10-11 LAB — POCT INR: INR: 3 (ref 2.0–3.0)

## 2021-10-11 NOTE — Patient Instructions (Signed)
Description   Continue taking 7.5mg  daily EXCEPT 5mg  on Mondays and Fridays.  Recheck INR in 1 week.   Mechanical valve, possible 3 month duration. Coumadin Clinic [phone]

## 2021-10-18 ENCOUNTER — Ambulatory Visit (INDEPENDENT_AMBULATORY_CARE_PROVIDER_SITE_OTHER): Payer: Medicare Other

## 2021-10-18 ENCOUNTER — Other Ambulatory Visit: Payer: Self-pay

## 2021-10-18 DIAGNOSIS — I4891 Unspecified atrial fibrillation: Secondary | ICD-10-CM

## 2021-10-18 DIAGNOSIS — Z5181 Encounter for therapeutic drug level monitoring: Secondary | ICD-10-CM | POA: Diagnosis not present

## 2021-10-18 LAB — POCT INR: INR: 1.2 — AB (ref 2.0–3.0)

## 2021-10-18 NOTE — Patient Instructions (Signed)
Description   2 tablets today and 2 tablets tomorrow and then continue taking 7.5mg  daily EXCEPT 5mg  on Mondays and Fridays.  Recheck INR in 1 week.   Mechanical valve, possible 3 month duration. Coumadin Clinic [phone]

## 2021-10-25 ENCOUNTER — Ambulatory Visit (INDEPENDENT_AMBULATORY_CARE_PROVIDER_SITE_OTHER): Payer: Medicare Other

## 2021-10-25 ENCOUNTER — Other Ambulatory Visit: Payer: Self-pay

## 2021-10-25 DIAGNOSIS — Z5181 Encounter for therapeutic drug level monitoring: Secondary | ICD-10-CM | POA: Diagnosis not present

## 2021-10-25 DIAGNOSIS — I4891 Unspecified atrial fibrillation: Secondary | ICD-10-CM

## 2021-10-25 LAB — POCT INR: INR: 4.6 — AB (ref 2.0–3.0)

## 2021-10-25 NOTE — Patient Instructions (Addendum)
Description   ?Hold tomorrow's dose (already taken today's dose) and only take 5mg  on Thursday and then START taking 7.5mg  daily EXCEPT 5mg  on Mondays, Wednesdays, and Fridays.  ?Recheck INR in 1 week.   ?Mechanical valve, possible 3 month duration. ?Coumadin Clinic [phone] ?  ?   ?

## 2021-11-01 ENCOUNTER — Ambulatory Visit (INDEPENDENT_AMBULATORY_CARE_PROVIDER_SITE_OTHER): Payer: Medicare Other

## 2021-11-01 ENCOUNTER — Other Ambulatory Visit: Payer: Self-pay

## 2021-11-01 DIAGNOSIS — I4891 Unspecified atrial fibrillation: Secondary | ICD-10-CM | POA: Diagnosis not present

## 2021-11-01 DIAGNOSIS — Z5181 Encounter for therapeutic drug level monitoring: Secondary | ICD-10-CM | POA: Diagnosis not present

## 2021-11-01 LAB — POCT INR: INR: 2.7 (ref 2.0–3.0)

## 2021-11-01 NOTE — Patient Instructions (Signed)
Description   ?Continue taking 7.5mg  daily EXCEPT 5mg  on Mondays, Wednesdays, and Fridays.  ?Recheck INR in 1 week.   ?Mechanical valve, possible 3 month duration. ?Coumadin Clinic [phone] ?  ?   ?

## 2021-11-08 ENCOUNTER — Other Ambulatory Visit: Payer: Self-pay

## 2021-11-08 ENCOUNTER — Ambulatory Visit (INDEPENDENT_AMBULATORY_CARE_PROVIDER_SITE_OTHER): Payer: Medicare Other

## 2021-11-08 DIAGNOSIS — Z5181 Encounter for therapeutic drug level monitoring: Secondary | ICD-10-CM

## 2021-11-08 DIAGNOSIS — Z952 Presence of prosthetic heart valve: Secondary | ICD-10-CM

## 2021-11-08 DIAGNOSIS — Z7901 Long term (current) use of anticoagulants: Secondary | ICD-10-CM | POA: Diagnosis not present

## 2021-11-08 DIAGNOSIS — I4891 Unspecified atrial fibrillation: Secondary | ICD-10-CM | POA: Diagnosis not present

## 2021-11-08 LAB — POCT INR: INR: 3.4 — AB (ref 2.0–3.0)

## 2021-11-08 NOTE — Patient Instructions (Signed)
Description   Continue taking 7.5mg daily EXCEPT 5mg on Mondays, Wednesdays, and Fridays. Recheck INR in 2 weeks Mechanical valve, possible 3 month duration. Coumadin Clinic #[phone]      

## 2021-11-22 ENCOUNTER — Ambulatory Visit (INDEPENDENT_AMBULATORY_CARE_PROVIDER_SITE_OTHER): Payer: Medicare Other

## 2021-11-22 DIAGNOSIS — I4891 Unspecified atrial fibrillation: Secondary | ICD-10-CM | POA: Diagnosis not present

## 2021-11-22 DIAGNOSIS — Z5181 Encounter for therapeutic drug level monitoring: Secondary | ICD-10-CM

## 2021-11-22 DIAGNOSIS — Z7901 Long term (current) use of anticoagulants: Secondary | ICD-10-CM | POA: Diagnosis not present

## 2021-11-22 DIAGNOSIS — Z952 Presence of prosthetic heart valve: Secondary | ICD-10-CM

## 2021-11-22 LAB — POCT INR: INR: 3.7 — AB (ref 2.0–3.0)

## 2021-11-22 NOTE — Patient Instructions (Addendum)
Description   ?Eat a serving of greens today and then continue taking 7.5mg  daily EXCEPT 5mg  on Mondays, Wednesdays, and Fridays.  ?Recheck INR in 3 weeks   ?Mechanical valve, possible 3 month duration. ?Coumadin Clinic [phone] ?  ?   ?

## 2021-12-20 ENCOUNTER — Ambulatory Visit (INDEPENDENT_AMBULATORY_CARE_PROVIDER_SITE_OTHER): Payer: Medicare Other

## 2021-12-20 DIAGNOSIS — I4891 Unspecified atrial fibrillation: Secondary | ICD-10-CM | POA: Diagnosis not present

## 2021-12-20 DIAGNOSIS — Z7901 Long term (current) use of anticoagulants: Secondary | ICD-10-CM | POA: Diagnosis not present

## 2021-12-20 DIAGNOSIS — Z5181 Encounter for therapeutic drug level monitoring: Secondary | ICD-10-CM | POA: Diagnosis not present

## 2021-12-20 DIAGNOSIS — Z952 Presence of prosthetic heart valve: Secondary | ICD-10-CM

## 2021-12-20 LAB — POCT INR: INR: 3.5 — AB (ref 2.0–3.0)

## 2021-12-20 NOTE — Patient Instructions (Signed)
Description   ?Continue taking 7.5mg  daily EXCEPT 5mg  on Mondays, Wednesdays, and Fridays.  ?Recheck INR in 2 weeks (per pt request)  ?Mechanical valve, possible 3 month duration. ?Coumadin Clinic [phone] ?  ?   ?

## 2022-01-03 ENCOUNTER — Ambulatory Visit (INDEPENDENT_AMBULATORY_CARE_PROVIDER_SITE_OTHER): Payer: Medicare Other

## 2022-01-03 DIAGNOSIS — Z7901 Long term (current) use of anticoagulants: Secondary | ICD-10-CM | POA: Diagnosis not present

## 2022-01-03 DIAGNOSIS — Z5181 Encounter for therapeutic drug level monitoring: Secondary | ICD-10-CM | POA: Diagnosis not present

## 2022-01-03 DIAGNOSIS — Z952 Presence of prosthetic heart valve: Secondary | ICD-10-CM

## 2022-01-03 DIAGNOSIS — I4891 Unspecified atrial fibrillation: Secondary | ICD-10-CM

## 2022-01-03 LAB — POCT INR: INR: 3 (ref 2.0–3.0)

## 2022-01-03 NOTE — Patient Instructions (Signed)
Description   Continue taking 7.5mg daily EXCEPT 5mg on Mondays, Wednesdays, and Fridays. Recheck INR in 4 weeks Mechanical valve, possible 3 month duration. Coumadin Clinic #[phone]      

## 2022-01-31 ENCOUNTER — Ambulatory Visit (INDEPENDENT_AMBULATORY_CARE_PROVIDER_SITE_OTHER): Payer: Medicare Other

## 2022-01-31 ENCOUNTER — Telehealth: Payer: Self-pay | Admitting: Cardiology

## 2022-01-31 DIAGNOSIS — I4891 Unspecified atrial fibrillation: Secondary | ICD-10-CM

## 2022-01-31 DIAGNOSIS — Z5181 Encounter for therapeutic drug level monitoring: Secondary | ICD-10-CM | POA: Diagnosis not present

## 2022-01-31 DIAGNOSIS — Z7901 Long term (current) use of anticoagulants: Secondary | ICD-10-CM | POA: Diagnosis not present

## 2022-01-31 DIAGNOSIS — Z952 Presence of prosthetic heart valve: Secondary | ICD-10-CM

## 2022-01-31 LAB — POCT INR: INR: 6.5 — AB (ref 2.0–3.0)

## 2022-01-31 NOTE — Telephone Encounter (Signed)
[facility] called to notify INR was 6.1  Called the patient and she states she is aware, her INR was 6.5 at the clinic. She had already taken 7.5mg  warfarin today She takes 5mg  MWF and other days 7.5mg  (TuThuSaSun)  -> told her to hold warfarin dose for 6/14 Call clinic tomorrow and have her recheck INR on 6/15 and decide further doses (probably need to be on 5mg  daily), but Anticoagulation clinic will decide  Patient agreed. And if bleeding or brusing more than usual, she is aware to go to the hospital  Renae Fickle, MD Cardiology coverage

## 2022-02-01 LAB — PROTIME-INR
INR: 6.1 (ref 0.9–1.2)
Prothrombin Time: 58.3 s — ABNORMAL HIGH (ref 9.1–12.0)

## 2022-02-01 NOTE — Patient Instructions (Signed)
Description   POC INR 6.5: STAT PT/INR ordered. Instructed pt to HOLD Warfarin until labs are results and she receives a call from Coumadin Clinic with dosing instructions.    6/13 @ 1645: Called and spoke with [facility] in [place] who stated "STAT lab has a 4 hr turn around time and may not be resulted for couple more hours". Called pt and reminded her to HOLD Warfarin until labs are resulted. - pt verbalized understanding.   6/14: Called pt and instructed to HOLD Warfarin today, tomorrow and Friday (already took 6/13 dose). Then resume taking 7.5mg  daily EXCEPT 5mg  on Mondays, Wednesdays, and Fridays.   Recheck INR in 1 week (02/08/22)  - Educated pt on the need to recheck INR within 1 week. Pt refused and stated she can NOT come any sooner than 02/08/22. Lab order placed to be checked at [facility] office on 02/08/22. Made pt aware of risk.  Mechanical valve, possible 3 month duration. Coumadin Clinic [phone]

## 2022-02-08 ENCOUNTER — Telehealth: Payer: Self-pay

## 2022-02-08 NOTE — Telephone Encounter (Signed)
Pt had order to have PT/INR drawn today at [facility] office. Called pt to confirm she went to lab, no answer. Left voicemail for pt to call back.

## 2022-02-09 ENCOUNTER — Ambulatory Visit (INDEPENDENT_AMBULATORY_CARE_PROVIDER_SITE_OTHER): Payer: Medicare Other

## 2022-02-09 DIAGNOSIS — Z952 Presence of prosthetic heart valve: Secondary | ICD-10-CM | POA: Diagnosis not present

## 2022-02-09 DIAGNOSIS — Z7901 Long term (current) use of anticoagulants: Secondary | ICD-10-CM | POA: Diagnosis not present

## 2022-02-09 LAB — PROTIME-INR
INR: 2.1 — ABNORMAL HIGH (ref 0.9–1.2)
Prothrombin Time: 21.6 s — ABNORMAL HIGH (ref 9.1–12.0)

## 2022-02-14 ENCOUNTER — Telehealth: Payer: Self-pay

## 2022-02-14 NOTE — Telephone Encounter (Signed)
Pt needs follow-up anticoagulation appt. Called pt, no answer. Left voicemail and provided pt a call back number [phone]) to call and schedule Coumadin Clinic appt as soon as possible since INR has not been therapeutic at previous visits.

## 2022-02-24 ENCOUNTER — Telehealth: Payer: Self-pay | Admitting: *Deleted

## 2022-02-24 NOTE — Telephone Encounter (Addendum)
Pt called and stated that she started a 7 day course of Doxy 100mg  BID on 02/23/2022. Pt stated that she could not come to the [place] office to have INR checked.  Instructed pt to eat extra servings of greens over the weekend. Informed pt that doxy can increase the INR. Scheduled pt an appointment to have INR checked on 02/28/2022 in [place].

## 2022-02-28 ENCOUNTER — Telehealth: Payer: Self-pay

## 2022-02-28 DIAGNOSIS — Z952 Presence of prosthetic heart valve: Secondary | ICD-10-CM

## 2022-02-28 NOTE — Telephone Encounter (Signed)
Called pt and made her aware she missed her anticoagulation appt this morning. Pt stated she called Coumadin Clinic last week to make them aware that she started antibiotics and she does not remember them telling her she had an appt today. I asked if she could still come to [facility] today and I can work her in; however, pt stated she can no longer come today and will not have a ride again for 2 weeks. I educated Ms Schlag on the importance of checking INR since she is on antibiotics and she is also overdue for INR to be checked. Pt stated she can come to [place] office on Friday and have labs drawn. PT/INR lab order placed for Friday and made pt aware that she needs to come in the morning so labs will be resulted and I can call her with dosing instructed before the weekend. Pt verbalized understanding.

## 2022-03-03 ENCOUNTER — Ambulatory Visit (INDEPENDENT_AMBULATORY_CARE_PROVIDER_SITE_OTHER): Payer: Medicare Other

## 2022-03-03 DIAGNOSIS — Z7901 Long term (current) use of anticoagulants: Secondary | ICD-10-CM | POA: Diagnosis not present

## 2022-03-03 DIAGNOSIS — Z952 Presence of prosthetic heart valve: Secondary | ICD-10-CM | POA: Diagnosis not present

## 2022-03-03 LAB — PROTIME-INR
INR: 2.6 — ABNORMAL HIGH (ref 0.9–1.2)
Prothrombin Time: 24.7 s — ABNORMAL HIGH (ref 9.1–12.0)

## 2022-03-03 NOTE — Patient Instructions (Signed)
Description   Continue taking 7.5mg  daily EXCEPT 5mg  on Mondays, Wednesdays, and Fridays. Recheck INR in 2 weeks Mechanical valve, possible 3 month duration. Coumadin Clinic [phone]

## 2022-03-14 ENCOUNTER — Ambulatory Visit (INDEPENDENT_AMBULATORY_CARE_PROVIDER_SITE_OTHER): Payer: Medicare Other

## 2022-03-14 DIAGNOSIS — I4891 Unspecified atrial fibrillation: Secondary | ICD-10-CM

## 2022-03-14 DIAGNOSIS — Z5181 Encounter for therapeutic drug level monitoring: Secondary | ICD-10-CM

## 2022-03-14 DIAGNOSIS — Z7901 Long term (current) use of anticoagulants: Secondary | ICD-10-CM | POA: Diagnosis not present

## 2022-03-14 DIAGNOSIS — Z952 Presence of prosthetic heart valve: Secondary | ICD-10-CM

## 2022-03-14 LAB — POCT INR: INR: 3.2 — AB (ref 2.0–3.0)

## 2022-03-14 NOTE — Patient Instructions (Signed)
Description   Continue taking 7.5mg  daily EXCEPT 5mg  on Mondays, Wednesdays, and Fridays. Recheck INR in 4 weeks Mechanical valve, possible 3 month duration. Coumadin Clinic [phone]

## 2022-04-11 ENCOUNTER — Ambulatory Visit (INDEPENDENT_AMBULATORY_CARE_PROVIDER_SITE_OTHER): Payer: Medicare Other

## 2022-04-11 DIAGNOSIS — Z952 Presence of prosthetic heart valve: Secondary | ICD-10-CM

## 2022-04-11 DIAGNOSIS — I4891 Unspecified atrial fibrillation: Secondary | ICD-10-CM

## 2022-04-11 DIAGNOSIS — Z7901 Long term (current) use of anticoagulants: Secondary | ICD-10-CM

## 2022-04-11 DIAGNOSIS — Z5181 Encounter for therapeutic drug level monitoring: Secondary | ICD-10-CM

## 2022-04-11 LAB — POCT INR: INR: 3.7 — AB (ref 2.0–3.0)

## 2022-04-11 NOTE — Patient Instructions (Signed)
Description   Eat greens today and then continue taking 7.5mg  daily EXCEPT 5mg  on Mondays, Wednesdays, and Fridays. Recheck INR in 4 weeks Mechanical valve, possible 3 month duration. Coumadin Clinic [phone]

## 2022-05-09 ENCOUNTER — Ambulatory Visit: Payer: Medicare Other | Attending: Cardiology

## 2022-05-09 ENCOUNTER — Other Ambulatory Visit: Payer: Self-pay

## 2022-05-09 DIAGNOSIS — Z5181 Encounter for therapeutic drug level monitoring: Secondary | ICD-10-CM | POA: Diagnosis not present

## 2022-05-09 DIAGNOSIS — I4891 Unspecified atrial fibrillation: Secondary | ICD-10-CM | POA: Diagnosis not present

## 2022-05-09 DIAGNOSIS — Z952 Presence of prosthetic heart valve: Secondary | ICD-10-CM

## 2022-05-09 DIAGNOSIS — Z7901 Long term (current) use of anticoagulants: Secondary | ICD-10-CM

## 2022-05-09 LAB — POCT INR: INR: 2.8 (ref 2.0–3.0)

## 2022-05-09 MED ORDER — WARFARIN SODIUM 5 MG PO TABS: ORAL_TABLET | ORAL | 0 refills | Status: AC

## 2022-05-09 NOTE — Patient Instructions (Signed)
Description   Continue taking 7.5mg  daily EXCEPT 5mg  on Mondays, Wednesdays, and Fridays. Recheck INR in 5 weeks Mechanical valve, possible 3 month duration. Coumadin Clinic [phone]

## 2022-05-09 NOTE — Telephone Encounter (Signed)
Prescription refill request received for warfarin [place]: 07/12/21 ([facility]) Next INR check: 06/13/22 Warfarin tablet strength: 5mg    Appropriate dose and refill sent to requested pharmacy

## 2022-06-13 ENCOUNTER — Ambulatory Visit: Payer: Medicare Other | Attending: Cardiology

## 2022-06-13 DIAGNOSIS — I4891 Unspecified atrial fibrillation: Secondary | ICD-10-CM | POA: Diagnosis not present

## 2022-06-13 DIAGNOSIS — Z5181 Encounter for therapeutic drug level monitoring: Secondary | ICD-10-CM | POA: Diagnosis not present

## 2022-06-13 DIAGNOSIS — Z952 Presence of prosthetic heart valve: Secondary | ICD-10-CM

## 2022-06-13 DIAGNOSIS — Z7901 Long term (current) use of anticoagulants: Secondary | ICD-10-CM

## 2022-06-13 LAB — POCT INR: INR: 4.2 — AB (ref 2.0–3.0)

## 2022-06-13 NOTE — Patient Instructions (Signed)
Description   Hold today's dose and then continue taking 7.5mg  daily EXCEPT 5mg  on Mondays, Wednesdays, and Fridays. Recheck INR in 3 weeks Mechanical valve, possible 3 month duration. Coumadin Clinic [phone]

## 2022-07-04 ENCOUNTER — Ambulatory Visit: Payer: Medicare Other | Attending: Cardiology

## 2022-07-04 DIAGNOSIS — Z5181 Encounter for therapeutic drug level monitoring: Secondary | ICD-10-CM | POA: Diagnosis not present

## 2022-07-04 DIAGNOSIS — I4891 Unspecified atrial fibrillation: Secondary | ICD-10-CM | POA: Diagnosis not present

## 2022-07-04 DIAGNOSIS — Z952 Presence of prosthetic heart valve: Secondary | ICD-10-CM | POA: Diagnosis not present

## 2022-07-04 DIAGNOSIS — Z7901 Long term (current) use of anticoagulants: Secondary | ICD-10-CM | POA: Diagnosis not present

## 2022-07-04 LAB — POCT INR: INR: 2.1 (ref 2.0–3.0)

## 2022-07-04 NOTE — Patient Instructions (Signed)
Description   Take an extra 1/2 tablet today and then continue taking 7.5mg  daily EXCEPT 5mg  on Mondays, Wednesdays, and Fridays. Recheck INR in 4 weeks Mechanical valve, possible 3 month duration. Coumadin Clinic [phone]

## 2022-08-01 ENCOUNTER — Ambulatory Visit: Payer: Medicare Other | Attending: Cardiovascular Disease

## 2022-08-01 DIAGNOSIS — Z7901 Long term (current) use of anticoagulants: Secondary | ICD-10-CM

## 2022-08-01 DIAGNOSIS — Z5181 Encounter for therapeutic drug level monitoring: Secondary | ICD-10-CM | POA: Diagnosis not present

## 2022-08-01 DIAGNOSIS — Z952 Presence of prosthetic heart valve: Secondary | ICD-10-CM

## 2022-08-01 DIAGNOSIS — I4891 Unspecified atrial fibrillation: Secondary | ICD-10-CM

## 2022-08-01 LAB — POCT INR: INR: 4.7 — AB (ref 2.0–3.0)

## 2022-08-01 NOTE — Patient Instructions (Addendum)
Description   Hold today's dose and then continue taking 7.5mg  daily EXCEPT 5mg  on Mondays, Wednesdays, and Fridays.  Recheck INR in 3 weeks Mechanical valve, possible 3 month duration. Coumadin Clinic [phone]

## 2022-08-02 ENCOUNTER — Telehealth: Payer: Self-pay

## 2022-08-02 NOTE — Telephone Encounter (Addendum)
Pt sees Dr Terri Skains at [facility] and is having a DCCV today. [facility] requested INR/Warfarin to be monitored at their office in [place]. Called and spoke with [facility] office and confirmed pt has scheduled appt with their Coumadin Clinic on 08/07/22 at 8:15am. Called pt and made her aware. Pt verbalized understanding.   Anticoagulation episode resolved.

## 2022-09-13 IMAGING — DX DG CHEST 1V PORT
1 series · 1 of 1 positions shown · non-contrast
Comparison: 07/16/2021

CLINICAL DATA: Central line placement

EXAM:
PORTABLE CHEST 1 VIEW

[chest]
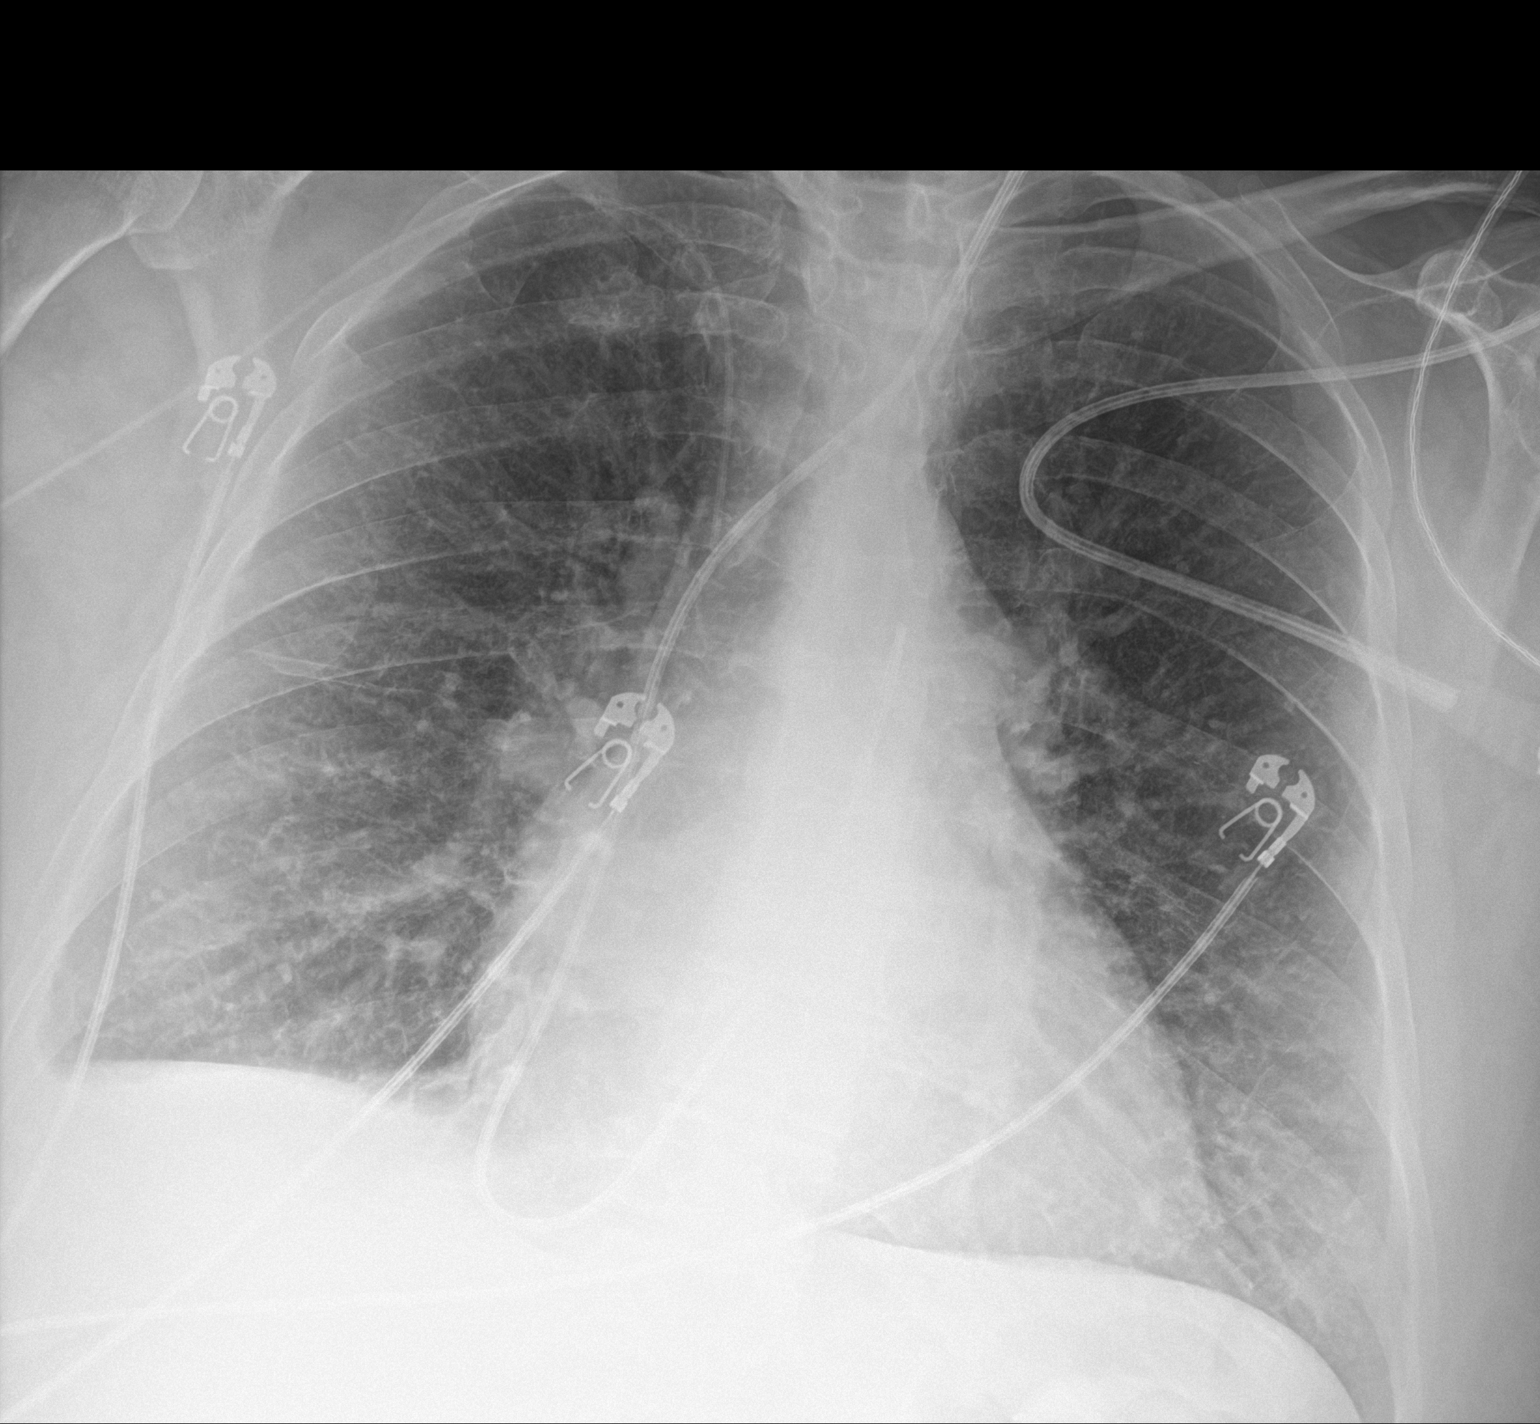

[1 of 1 positions shown; findings below may reference images not displayed]

FINDINGS: Left IJ central line tip overlies the main pulmonary artery. Right
PICC line tip overlies SVC. No pneumothorax. Slightly improved lung
aeration with probable mild interstitial edema. Trace right pleural
effusion.
IMPRESSION: Lines as above.  No pneumothorax.

Slightly improved lung aeration with probable mild interstitial
edema. Trace right pleural effusion.

## 2022-09-14 IMAGING — DX DG ORTHOPANTOGRAM /PANORAMIC
1 series · 1 of 1 positions shown · non-contrast
Comparison: None.

CLINICAL DATA: Preoperative evaluation for heart surgery.

EXAM:
ORTHOPANTOGRAM/PANORAMIC

[view not recorded]
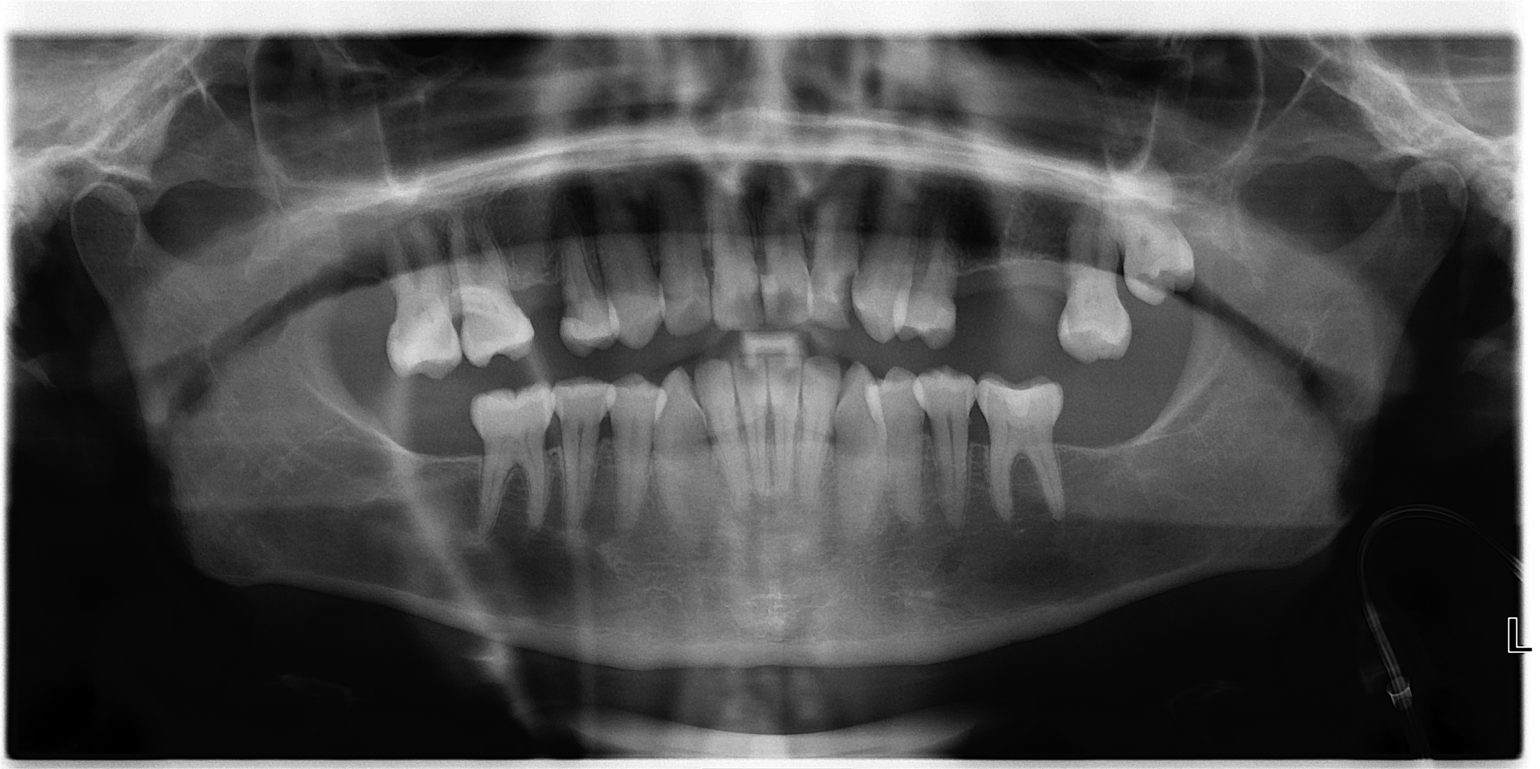

[1 of 1 positions shown; findings below may reference images not displayed]

FINDINGS: No evidence of periodontal disease or root abscess by Panorex.
Question dental caries of the anterior maxillary dentition.
IMPRESSION: No evidence of detectable periodontal disease or root abscess by
Panorex. Probable few caries of the anterior maxillary dentition.

## 2022-10-10 ENCOUNTER — Ambulatory Visit: Payer: Medicare Other | Admitting: Cardiology

## 2022-12-22 ENCOUNTER — Other Ambulatory Visit: Payer: Self-pay | Admitting: Nurse Practitioner

## 2022-12-22 DIAGNOSIS — K219 Gastro-esophageal reflux disease without esophagitis: Secondary | ICD-10-CM

## 2024-10-01 ENCOUNTER — Ambulatory Visit: Admitting: Podiatry

## 2024-10-02 ENCOUNTER — Ambulatory Visit: Admitting: Podiatry
